# Patient Record
Sex: Female | Born: 1965 | Race: White | Hispanic: No | Marital: Married | Smoking: Current every day smoker
Health system: Southern US, Community
[De-identification: ages and names within clinical notes are randomized; demographics above are authoritative.]

## PROBLEM LIST (undated history)

## (undated) DIAGNOSIS — N390 Urinary tract infection, site not specified: Secondary | ICD-10-CM

## (undated) DIAGNOSIS — G894 Chronic pain syndrome: Secondary | ICD-10-CM

## (undated) DIAGNOSIS — J309 Allergic rhinitis, unspecified: Secondary | ICD-10-CM

## (undated) DIAGNOSIS — E049 Nontoxic goiter, unspecified: Secondary | ICD-10-CM

## (undated) DIAGNOSIS — M545 Low back pain, unspecified: Secondary | ICD-10-CM

## (undated) DIAGNOSIS — I10 Essential (primary) hypertension: Secondary | ICD-10-CM

## (undated) DIAGNOSIS — M797 Fibromyalgia: Secondary | ICD-10-CM

## (undated) DIAGNOSIS — M4800 Spinal stenosis, site unspecified: Secondary | ICD-10-CM

## (undated) HISTORY — DX: Chronic pain syndrome: G89.4

## (undated) HISTORY — DX: Urinary tract infection, site not specified: N39.0

## (undated) HISTORY — PX: LAMINECTOMY: SHX219

## (undated) HISTORY — PX: CRANIECTOMY: SHX331

## (undated) HISTORY — DX: Spinal stenosis, site unspecified: M48.00

## (undated) HISTORY — DX: Low back pain: M54.5

## (undated) HISTORY — DX: Fibromyalgia: M79.7

## (undated) HISTORY — DX: Allergic rhinitis, unspecified: J30.9

## (undated) HISTORY — DX: Nontoxic goiter, unspecified: E04.9

## (undated) HISTORY — PX: URETHRAL DILATION: SUR417

## (undated) HISTORY — DX: Low back pain, unspecified: M54.50

## (undated) HISTORY — DX: Essential (primary) hypertension: I10

---

## 1998-05-03 ENCOUNTER — Ambulatory Visit (HOSPITAL_COMMUNITY): Admission: RE | Admit: 1998-05-03 | Discharge: 1998-05-03 | Payer: Self-pay | Admitting: Obstetrics and Gynecology

## 1998-08-07 ENCOUNTER — Observation Stay (HOSPITAL_COMMUNITY): Admission: AD | Admit: 1998-08-07 | Discharge: 1998-08-07 | Payer: Self-pay | Admitting: Obstetrics and Gynecology

## 1998-08-07 ENCOUNTER — Ambulatory Visit (HOSPITAL_COMMUNITY): Admission: RE | Admit: 1998-08-07 | Discharge: 1998-08-07 | Payer: Self-pay | Admitting: Obstetrics and Gynecology

## 1999-12-29 ENCOUNTER — Ambulatory Visit (HOSPITAL_COMMUNITY): Admission: RE | Admit: 1999-12-29 | Discharge: 1999-12-29 | Payer: Self-pay | Admitting: Internal Medicine

## 2000-07-29 ENCOUNTER — Other Ambulatory Visit: Admission: RE | Admit: 2000-07-29 | Discharge: 2000-07-29 | Payer: Self-pay | Admitting: Obstetrics and Gynecology

## 2000-07-29 ENCOUNTER — Encounter (INDEPENDENT_AMBULATORY_CARE_PROVIDER_SITE_OTHER): Payer: Self-pay

## 2001-03-17 ENCOUNTER — Encounter: Payer: Self-pay | Admitting: Emergency Medicine

## 2001-03-17 ENCOUNTER — Emergency Department (HOSPITAL_COMMUNITY): Admission: EM | Admit: 2001-03-17 | Discharge: 2001-03-17 | Payer: Self-pay | Admitting: Emergency Medicine

## 2001-03-21 ENCOUNTER — Encounter: Payer: Self-pay | Admitting: Emergency Medicine

## 2001-03-21 ENCOUNTER — Emergency Department (HOSPITAL_COMMUNITY): Admission: EM | Admit: 2001-03-21 | Discharge: 2001-03-21 | Payer: Self-pay | Admitting: Emergency Medicine

## 2001-03-21 ENCOUNTER — Encounter: Admission: RE | Admit: 2001-03-21 | Discharge: 2001-03-21 | Payer: Self-pay | Admitting: Emergency Medicine

## 2001-12-03 ENCOUNTER — Emergency Department (HOSPITAL_COMMUNITY): Admission: EM | Admit: 2001-12-03 | Discharge: 2001-12-03 | Payer: Self-pay | Admitting: Emergency Medicine

## 2004-11-24 ENCOUNTER — Ambulatory Visit: Payer: Self-pay | Admitting: Internal Medicine

## 2006-02-10 ENCOUNTER — Ambulatory Visit: Payer: Self-pay | Admitting: Internal Medicine

## 2006-02-14 ENCOUNTER — Ambulatory Visit: Payer: Self-pay | Admitting: Internal Medicine

## 2006-03-28 ENCOUNTER — Ambulatory Visit: Payer: Self-pay | Admitting: Internal Medicine

## 2007-05-05 ENCOUNTER — Ambulatory Visit: Payer: Self-pay | Admitting: Internal Medicine

## 2007-07-25 ENCOUNTER — Encounter: Payer: Self-pay | Admitting: *Deleted

## 2007-07-25 DIAGNOSIS — Z9889 Other specified postprocedural states: Secondary | ICD-10-CM | POA: Insufficient documentation

## 2007-07-25 DIAGNOSIS — G894 Chronic pain syndrome: Secondary | ICD-10-CM | POA: Insufficient documentation

## 2007-07-25 DIAGNOSIS — J309 Allergic rhinitis, unspecified: Secondary | ICD-10-CM | POA: Insufficient documentation

## 2007-07-25 DIAGNOSIS — N390 Urinary tract infection, site not specified: Secondary | ICD-10-CM | POA: Insufficient documentation

## 2007-07-25 DIAGNOSIS — M545 Low back pain: Secondary | ICD-10-CM | POA: Insufficient documentation

## 2007-07-25 DIAGNOSIS — I10 Essential (primary) hypertension: Secondary | ICD-10-CM | POA: Insufficient documentation

## 2007-07-25 DIAGNOSIS — E049 Nontoxic goiter, unspecified: Secondary | ICD-10-CM | POA: Insufficient documentation

## 2007-07-25 DIAGNOSIS — IMO0001 Reserved for inherently not codable concepts without codable children: Secondary | ICD-10-CM

## 2007-07-25 DIAGNOSIS — M4802 Spinal stenosis, cervical region: Secondary | ICD-10-CM | POA: Insufficient documentation

## 2007-07-26 ENCOUNTER — Ambulatory Visit: Payer: Self-pay | Admitting: Internal Medicine

## 2007-10-27 ENCOUNTER — Ambulatory Visit: Payer: Self-pay | Admitting: Internal Medicine

## 2008-02-05 ENCOUNTER — Ambulatory Visit: Payer: Self-pay | Admitting: Internal Medicine

## 2008-04-09 HISTORY — PX: APPENDECTOMY: SHX54

## 2008-04-23 ENCOUNTER — Ambulatory Visit: Payer: Self-pay | Admitting: Internal Medicine

## 2008-04-23 LAB — CONVERTED CEMR LAB
AST: 24 units/L (ref 0–37)
Albumin: 3.5 g/dL (ref 3.5–5.2)
Alkaline Phosphatase: 124 units/L — ABNORMAL HIGH (ref 39–117)
BUN: 7 mg/dL (ref 6–23)
Calcium: 8.9 mg/dL (ref 8.4–10.5)
Chloride: 107 meq/L (ref 96–112)
Direct LDL: 90.7 mg/dL
Eosinophils Absolute: 0.1 10*3/uL (ref 0.0–0.7)
Eosinophils Relative: 0.7 % (ref 0.0–5.0)
GFR calc non Af Amer: 97 mL/min
HCT: 32.2 % — ABNORMAL LOW (ref 36.0–46.0)
Ketones, ur: NEGATIVE mg/dL
Lymphocytes Relative: 11 % — ABNORMAL LOW (ref 12.0–46.0)
Monocytes Absolute: 0.4 10*3/uL (ref 0.1–1.0)
Monocytes Relative: 2.6 % — ABNORMAL LOW (ref 3.0–12.0)
Neutro Abs: 12.5 10*3/uL — ABNORMAL HIGH (ref 1.4–7.7)
Neutrophils Relative %: 84.9 % — ABNORMAL HIGH (ref 43.0–77.0)
Nitrite: NEGATIVE
Platelets: 306 10*3/uL (ref 150–400)
RBC: 4.42 M/uL (ref 3.87–5.11)
RDW: 18.9 % — ABNORMAL HIGH (ref 11.5–14.6)
Specific Gravity, Urine: 1.015 (ref 1.000–1.03)
TSH: 1.81 microintl units/mL (ref 0.35–5.50)
Total Protein, Urine: NEGATIVE mg/dL
Total Protein: 7 g/dL (ref 6.0–8.3)
Urine Glucose: NEGATIVE mg/dL
Urobilinogen, UA: 0.2 (ref 0.0–1.0)
WBC: 14.7 10*3/uL — ABNORMAL HIGH (ref 4.5–10.5)
pH: 8 (ref 5.0–8.0)

## 2008-04-24 ENCOUNTER — Ambulatory Visit (HOSPITAL_COMMUNITY): Admission: EM | Admit: 2008-04-24 | Discharge: 2008-04-26 | Payer: Self-pay | Admitting: Emergency Medicine

## 2008-04-24 ENCOUNTER — Encounter (INDEPENDENT_AMBULATORY_CARE_PROVIDER_SITE_OTHER): Payer: Self-pay | Admitting: General Surgery

## 2008-05-13 ENCOUNTER — Ambulatory Visit: Payer: Self-pay | Admitting: Internal Medicine

## 2008-05-13 ENCOUNTER — Encounter: Payer: Self-pay | Admitting: Internal Medicine

## 2008-05-13 ENCOUNTER — Other Ambulatory Visit: Admission: RE | Admit: 2008-05-13 | Discharge: 2008-05-13 | Payer: Self-pay | Admitting: Internal Medicine

## 2008-05-13 DIAGNOSIS — K3580 Unspecified acute appendicitis: Secondary | ICD-10-CM | POA: Insufficient documentation

## 2008-05-14 LAB — HM PAP SMEAR

## 2008-05-20 ENCOUNTER — Encounter: Payer: Self-pay | Admitting: Internal Medicine

## 2008-08-15 ENCOUNTER — Ambulatory Visit: Payer: Self-pay | Admitting: Internal Medicine

## 2008-10-18 ENCOUNTER — Telehealth (INDEPENDENT_AMBULATORY_CARE_PROVIDER_SITE_OTHER): Payer: Self-pay | Admitting: *Deleted

## 2008-10-29 ENCOUNTER — Ambulatory Visit: Payer: Self-pay | Admitting: Internal Medicine

## 2009-03-17 ENCOUNTER — Ambulatory Visit: Payer: Self-pay | Admitting: Internal Medicine

## 2009-07-02 ENCOUNTER — Ambulatory Visit: Payer: Self-pay | Admitting: Internal Medicine

## 2009-10-16 ENCOUNTER — Telehealth: Payer: Self-pay | Admitting: Internal Medicine

## 2009-10-24 ENCOUNTER — Ambulatory Visit: Payer: Self-pay | Admitting: Internal Medicine

## 2009-10-24 LAB — CONVERTED CEMR LAB
BUN: 6 mg/dL (ref 6–23)
Calcium: 9 mg/dL (ref 8.4–10.5)
Chloride: 107 meq/L (ref 96–112)
Creatinine, Ser: 0.7 mg/dL (ref 0.4–1.2)
GFR calc non Af Amer: 96.21 mL/min (ref >60)
Potassium: 4 meq/L (ref 3.5–5.1)

## 2009-10-26 DIAGNOSIS — F341 Dysthymic disorder: Secondary | ICD-10-CM

## 2009-11-03 ENCOUNTER — Emergency Department (HOSPITAL_COMMUNITY): Admission: EM | Admit: 2009-11-03 | Discharge: 2009-11-03 | Payer: Self-pay | Admitting: Emergency Medicine

## 2009-12-22 ENCOUNTER — Telehealth: Payer: Self-pay | Admitting: Internal Medicine

## 2010-01-15 ENCOUNTER — Ambulatory Visit: Payer: Self-pay | Admitting: Internal Medicine

## 2010-01-15 DIAGNOSIS — I872 Venous insufficiency (chronic) (peripheral): Secondary | ICD-10-CM | POA: Insufficient documentation

## 2010-04-23 ENCOUNTER — Ambulatory Visit: Payer: Self-pay | Admitting: Internal Medicine

## 2010-07-21 ENCOUNTER — Ambulatory Visit: Payer: Self-pay | Admitting: Internal Medicine

## 2010-08-27 ENCOUNTER — Telehealth: Payer: Self-pay | Admitting: Internal Medicine

## 2010-09-08 NOTE — Assessment & Plan Note (Signed)
Summary: FU/ REFILLS /NWS   Vital Signs:  Patient profile:   45 year old female Height:      61 inches Weight:      161 pounds BMI:     30.53 O2 Sat:      98 % on Room air Temp:     97.7 degrees F oral Pulse rate:   104 / minute BP sitting:   198 / 94  (left arm) Cuff size:   regular  Vitals Entered By: Charlynne Cousins CMA (April 23, 2010 11:08 AM)  O2 Flow:  Room air CC: pt here for follow up and refill on methadone. she would also like to discuss temazepam medication/ ab   Primary Care Provider:  norins  CC:  pt here for follow up and refill on methadone. she would also like to discuss temazepam medication/ ab.  History of Present Illness: Patient presents for routine medication mangement. She has been medically stable. She is under great stress: she in unemployed and may loose her house; her son is causing problems for her - comes to increased stress.   She has a good bowel habit. No methadone related somnolence or side-effects. Generally well controlled in regard to pain.   Reviewed record: had full labs Sept '10 - LDL 90, HDL 24, thyroid function normal. Had BMP April '11 - normal BUN/Cr and K.   Current Medications (verified): 1)  Sertraline Hcl 100 Mg Tabs (Sertraline Hcl) .Marland Kitchen.. 1 By Mouth Once Daily 2)  Methadone Hcl 10 Mg  Tabs (Methadone Hcl) .... Take A Total of 67m Three Times A Day To Be Filled On or After 03/26/2010 3)  Hydrochlorothiazide 25 Mg  Tabs (Hydrochlorothiazide) ..Marland Kitchen. 1 By Mouth Once Daily 4)  Lisinopril 10 Mg Tabs (Lisinopril) ..Marland Kitchen. 1 By Mouth Once Daily For Bp 5)  Temazepam 15 Mg Caps (Temazepam) ..Marland Kitchen. 1 By Mouth At Bedtime Prn  Allergies (verified): No Known Drug Allergies  Past History:  Past Medical History: Last updated: 07/25/2007 FIBROMYALGIA (ICD-729.1) GOITER, UNSPECIFIED (ICD-240.9) Hx of URINARY TRACT INFECTION, CHRONIC (ICD-599.0) * HYPOPLASTIC POSTERIOR FOSSA SYNDROME SPINAL STENOSIS (ICD-724.00) ALLERGIC RHINITIS  (ICD-477.9) HYPERTENSION (ICD-401.9) LOW BACK PAIN, CHRONIC (ICD-724.2) CHRONIC PAIN SYNDROME (ICD-338.4)    Past Surgical History: Last updated: 05/13/2008 CAESAREAN SECTION, HX OF X2 (ICD-V39.01) * Hx of URETHRAL DILATATION AT AGE 74 * Hx of CRANIECTOMY LAMINECTOMY, HX OF (ICD-V45.89) Appendectomy-laproscopic September '09 (Dr. 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    Physical Exam  General:  alert, well-developed, well-nourished, and well-hydrated.   Head:  normocephalic and atraumatic.   Eyes:  pupils equal, pupils round, corneas and lenses clear, no optic disk abnormalities, and no retinal abnormalitiies.   Ears:  R ear normal and L ear normal.   Mouth:  good dentition, no gingival abnormalities, and pharynx pink and moist.   Neck:  supple, no masses, no thyromegaly, and no carotid bruits.   Chest Wall:  no deformities and no tenderness.   Breasts:  No mass, nodules, thickening, tenderness, bulging, retraction, inflamation, nipple discharge or skin changes noted.   Lungs:  normal respiratory effort and normal breath  sounds.   Heart:  normal rate, regular rhythm, no murmur, and no JVD.   Abdomen:  soft, non-tender, normal bowel sounds, no guarding, no rigidity, and no hepatomegaly.   Msk:  normal ROM, no joint swelling, no joint warmth, and no joint deformities.   Pulses:  2+ radial Extremities:  No clubbing, cyanosis, edema, or deformity noted with normal full range of motion of all joints.   Neurologic:  alert & oriented  X3, cranial nerves II-XII intact, gait normal, and DTRs symmetrical and normal.   Skin:  turgor normal, color normal, no rashes, and no petechiae.   Cervical Nodes:  no anterior cervical adenopathy and no posterior cervical adenopathy.   Axillary Nodes:  no R axillary adenopathy and no L axillary adenopathy.   Psych:  Oriented X3, normally interactive, and good eye contact.     Impression & Recommendations:  Problem # 1:  DEPRESSION/ANXIETY (ICD-300.4) stable but stressed due to unemployment and trouble with her son.   No change in medications - continue sertraline  Problem # 2:  FIBROMYALGIA (ICD-729.1) No change. Tolerating medication. No adverse side effects noted.   Plan - 3 month renewal  Her updated medication list for this problem includes:    Methadone Hcl 10 Mg Tabs (Methadone hcl) .Marland Kitchen... Take a total of 1m three times a day to be filled on or after 06/26/2010  Problem # 3:  HYPERTENSION (ICD-401.9)  Her updated medication list for this problem includes:    Hydrochlorothiazide 25 Mg Tabs (Hydrochlorothiazide) ..Marland Kitchen.. 1 by mouth once daily    Lisinopril 10 Mg Tabs (Lisinopril) ..Marland Kitchen.. 1 by mouth once daily for bp  BP today: 198/94 Prior BP: 160/98 (01/15/2010)  Labs Reviewed: K+: 4.0 (10/24/2009) Creat: : 0.7 (10/24/2009)   Generally better control. she is asymptomatic.  Plan - continue present medications.   Problem # 4:  Preventive Health Care (ICD-V70.0) unremarkable exam. she has had recent labs and is current. she had a normal breast exam today. She is current with gyn exams.  Complete Medication List: 1)  Sertraline Hcl 100 Mg Tabs (Sertraline hcl) ..Marland Kitchen. 1 by mouth once daily 2)  Methadone Hcl 10 Mg Tabs (Methadone hcl) .... Take a total of 566mthree times a day to be filled on or after 06/26/2010 3)  Hydrochlorothiazide 25 Mg Tabs (Hydrochlorothiazide) ...Marland Kitchen 1 by mouth once daily 4)  Lisinopril 10 Mg Tabs (Lisinopril) ...Marland Kitchen 1 by mouth once daily for bp 5)   Triazolam 0.125 Mg Tabs (Triazolam) ...Marland Kitchen 1 by mouth at bedtime for insomnia Prescriptions: METHADONE HCL 10 MG  TABS (METHADONE HCL) Take a total of 5025mhree times a day To be filled on or after 06/26/2010  #450 x 0   Entered by:   Ami Bullins CMA   Authorized by:   MicNeena Rhymes   Signed by:   AmiCharlynne CousinsA on 04/23/2010   Method used:   Print then Give to Patient   RxID:   [id number] HCL 10 MG  TABS (METHADONE HCL) Take a total of 6m49mree times a day To be filled on or after 05/26/2010  #450 x 0   Entered by:   Ami Bullins CMA   Authorized by:   MichNeena Rhymes  Signed by:   Ami Charlynne Cousins on 04/23/2010   Method used:   Print then Give to Patient   RxID:   [id number] HCL 10 MG  TABS (METHADONE HCL) Take a total of 6mg6m  three times a day To be filled on or after 04/26/2010  #450 x 0   Entered by:   Ami Bullins CMA   Authorized by:   Neena Rhymes MD   Signed by:   Charlynne Cousins CMA on 04/23/2010   Method used:   Print then Give to Patient   RxID:   [id number] TRIAZOLAM 0.125 MG TABS (TRIAZOLAM) 1 by mouth at bedtime for insomnia  #30 x 5   Entered and Authorized by:   Neena Rhymes MD   Signed by:   Neena Rhymes MD on 04/23/2010   Method used:   Print then Give to Patient   RxID:   [id number]

## 2010-09-08 NOTE — Progress Notes (Signed)
Summary: REFILL  Phone Note Refill Request   Refills Requested: Medication #1:  TEMAZEPAM 15 MG CAPS 1 by mouth at bedtime prn. Initial call taken by: Charlsie Quest, [place],  Dec 22, 2009 5:49 PM  Follow-up for Phone Call        ok for refill  x 5 Follow-up by: Neena Rhymes MD,  Dec 23, 2009 10:46 AM    Prescriptions: TEMAZEPAM 15 MG CAPS (TEMAZEPAM) 1 by mouth at bedtime prn  #30 x 5   Entered by:   Ami Bullins CMA   Authorized by:   Neena Rhymes MD   Signed by:   Charlynne Cousins CMA on 12/23/2009   Method used:   Telephoned to ...       [facility] (retail)       [address] [place], [place]  [id number]       Ph: [phone]       Fax: [phone]   RxID:   [id number]

## 2010-09-08 NOTE — Assessment & Plan Note (Signed)
Summary: MED REFILL PER AMI/NWS  MAY COME AT 11:15   Vital Signs:  Patient profile:   45 year old female Height:      61 inches Weight:      164 pounds BMI:     31.10 O2 Sat:      98 % on Room air Temp:     97.6 degrees F oral Pulse rate:   70 / minute BP sitting:   138 / 86  (left arm) Cuff size:   large  Vitals Entered By: Charlynne Cousins CMA (October 24, 2009 11:33 AM)  O2 Flow:  Room air CC: pt here for follow up office visit. Pt needs refill on methadone and would like to discuss dose on temazepam and sertraline/ ab   Primary Care Provider:  norins  CC:  pt here for follow up office visit. Pt needs refill on methadone and would like to discuss dose on temazepam and sertraline/ ab.  History of Present Illness: Patient presents for follow-up of BP which is doing better on ACE-I but she did not come back for lab. she is tolerating BP med well.  She has done well with sertraline but feels it could help more.   She is stable on her pain medications.   Current Medications (verified): 1)  Sertraline Hcl 50 Mg Tabs (Sertraline Hcl) .Marland Kitchen.. 1 By Mouth Once Daily For Depression and To Augment Pain Mgt. 2)  Methadone Hcl 10 Mg  Tabs (Methadone Hcl) .... Take A Total of 72m Three Times A Day To Be Filled On or After 08/31/2009 3)  Hydrochlorothiazide 25 Mg  Tabs (Hydrochlorothiazide) ..Marland Kitchen. 1 By Mouth Once Daily 4)  Lisinopril 10 Mg Tabs (Lisinopril) ..Marland Kitchen. 1 By Mouth Once Daily For Bp 5)  Temazepam 15 Mg Caps (Temazepam) ..Marland Kitchen. 1 By Mouth At Bedtime Prn  Allergies (verified): No Known Drug Allergies  Past History:  Past Medical History: Last updated: 07/25/2007 FIBROMYALGIA (ICD-729.1) GOITER, UNSPECIFIED (ICD-240.9) Hx of URINARY TRACT INFECTION, CHRONIC (ICD-599.0) * HYPOPLASTIC POSTERIOR FOSSA SYNDROME SPINAL STENOSIS (ICD-724.00) ALLERGIC RHINITIS (ICD-477.9) HYPERTENSION (ICD-401.9) LOW BACK PAIN, CHRONIC (ICD-724.2) CHRONIC PAIN SYNDROME (ICD-338.4)    Past Surgical  History: Last updated: 05/13/2008 CAESAREAN SECTION, HX OF X2 (ICD-V39.01) * Hx of URETHRAL DILATATION AT AGE 30 * Hx of CRANIECTOMY LAMINECTOMY, HX OF (ICD-V45.89) Append[facility] September '09 [facility] Incre[facility] for [facility], FH reviewed for relevance  Review of Systems  The patient denies anorexia, weight loss, weight gain, chest pain, dyspnea on exertion, prolonged cough, abdominal pain, difficulty walking, depression, and enlarged lymph nodes.    Physical Exam  General:  overweight white female no distress Head:  Normocephalic and atraumatic without obvious abnormalities. No apparent alopecia or balding. Lungs:  normal respiratory effort and normal breath sounds.   Heart:  normal rate, regular rhythm, and no murmur.     Impression & Recommendations:  Problem # 1:  FIBROMYALGIA (ICD-729.1) Stable. No  GI problems, somnolence or other side effects of medication.  Plan - Rx refill 30days with 3 scripts  Her updated medication list for this problem includes:    Methadone Hcl 10 Mg Tabs (Methadone hcl) ..Marland Kitchen.. Take a total of 590mthree times a day to be filled on or after 12/24/2009  Problem # 2:  HYPERTENSION (ICD-401.9)  Her updated medication list for this problem includes:    Hydrochlorothiazide 25 Mg Tabs (Hydrochlorothiazide) ...Marland Kitchen. 1 by mouth once daily    Lisinopril 10 Mg Tabs (Lisinopril) ...Marland Kitchen. 1 by mouth once daily for  bp  Orders: TLB-BMP (Basic Metabolic Panel-BMET) ([id number])  BP today: 138/86 Prior BP: 150/100 (07/02/2009)  Adequate control. Bmet normal  Problem # 3:  DEPRESSION/ANXIETY (ICD-300.4)  Had a good response to sertraline but still with symptoms.  Plan - increase sertraline to 140m once daily.   Orders: Prescription Created Electronically [phone]  Complete Medication List: 1)  Sertraline Hcl 100 Mg Tabs (Sertraline hcl) ..Marland Kitchen. 1 by mouth once daily 2)  Methadone Hcl 10 Mg Tabs (Methadone hcl) .... Take a total of 546m three times a day to be filled on or after 12/24/2009 3)  Hydrochlorothiazide 25 Mg Tabs (Hydrochlorothiazide) ...Marland Kitchen 1 by mouth once daily 4)  Lisinopril 10 Mg Tabs (Lisinopril) ...Marland Kitchen 1 by mouth once daily for bp 5)  Temazepam 15 Mg Caps (Temazepam) ...Marland Kitchen 1 by mouth at bedtime prn  Patient: Emily [name] Note: All result statuses are Final unless otherwise noted.  Tests: (1) BMP (METABOL)   Sodium                    141 mEq/L                   135-145   Potassium                 4.0 mEq/L                   3.5-5.1   Chloride                  107 mEq/L                   96-112   Carbon Dioxide            29 mEq/L                    19-32   Glucose                   83 mg/dL                    70-99   BUN                       6 mg/dL                     6-23   Creatinine                0.7 mg/dL                   0.4-1.2   Calcium                   9.0 mg/dL                   8.4-10.5   GFR                       96.21 mL/min                >60Prescriptions: METHADONE HCL 10 MG  TABS (METHADONE HCL) Take a total of 5043mhree times a day To be filled on or after 12/24/2009  #450 x 0   Entered by:   Ami Bullins CMA   Authorized by:   MicNeena Rhymes   Signed by:   AmiCharlynne CousinsA on 10/24/2009   Method used:  Print then Give to Patient   RxID:   [id number] METHADONE HCL 10 MG  TABS (METHADONE HCL) Take a total of 30m three times a day To be filled on or after 11/24/2009  #450 x 0   Entered by:   Ami Bullins CMA   Authorized by:   MNeena RhymesMD   Signed by:   ACharlynne CousinsCMA on 10/24/2009   Method used:   Print then Give to Patient   RxID:   [id number] HCL 10 MG  TABS (METHADONE HCL) Take a total of 523mthree times a day To be filled on or after 10/24/2009  #450 x 0   Entered by:   Ami Bullins CMA   Authorized by:   MiNeena RhymesD   Signed by:   AmCharlynne CousinsMA on 10/24/2009   Method used:   Print then Give to Patient   RxID:    [id number] HCL 100 MG TABS (SERTRALINE HCL) 1 by mouth once daily  #30 x 12   Entered and Authorized by:   MiNeena RhymesD   Signed by:   MiNeena RhymesD on 10/24/2009   Method used:   Electronically to        [facility]       [id number]     Ph: [phone]     Fax: [phone] RxID:   [id number]

## 2010-09-08 NOTE — Progress Notes (Signed)
Summary: Methadone  Phone Note Call from Patient Call back at Home Phone [phone]   Summary of Call: Patient left message on triage that she has an appt on 3/24 but will run out of meds before that appt. Patient states that she only has 9 days left of her meds (Mathadone). Initial call taken by: Ernestene Mention,  October 16, 2009 9:41 AM  Follow-up for Phone Call        pt's appt moved to march 18, so we can fill her methadone at her appt as we always do Follow-up by: Ami Bullins CMA,  October 16, 2009 11:39 AM

## 2010-09-08 NOTE — Assessment & Plan Note (Signed)
Summary: SWOLLEN FEET/NWS   Vital Signs:  Patient profile:   45 year old Emily [name] Weight:      161 pounds Temp:     98.2 degrees F Pulse rate:   96 / minute BP sitting:   160 / 98  (left arm) Cuff size:   regular  Vitals Entered By: Charlsie Quest, CMA (January 15, 2010 2:36 PM) CC: Needs refills, c/o bilateral foot swelling, better w/rest   Primary Care Provider:  Dean Goldner  CC:  Needs refills, c/o bilateral foot swelling, and better w/rest.  History of Present Illness: Patient presents for evaluation of pedal and LE edema. This does progress through the day and is better in the AM. She reports improvement if she will elevate her legs during the day. No history of renal or heart failure.   Allergies: No Known Drug Allergies  Past History:  Past Medical History: Last updated: 07/25/2007 FIBROMYALGIA (ICD-729.1) GOITER, UNSPECIFIED (ICD-240.9) Hx of URINARY TRACT INFECTION, CHRONIC (ICD-599.0) * HYPOPLASTIC POSTERIOR FOSSA SYNDROME SPINAL STENOSIS (ICD-724.00) ALLERGIC RHINITIS (ICD-477.9) HYPERTENSION (ICD-401.9) LOW BACK PAIN, CHRONIC (ICD-724.2) CHRONIC PAIN SYNDROME (ICD-338.4)    Past Surgical History: Last updated: 05/13/2008 CAESAREAN SECTION, HX OF X2 (ICD-V39.01) * Hx of URETHRAL DILATATION AT AGE 26 * Hx of CRANIECTOMY LAMINECTOMY, HX OF (ICD-V45.89) Appendectomy-laproscopic September '09 (Dr. 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    Physical Exam  General:  Well-developed,well-nourished,in no acute distress; alert,appropriate and cooperative throughout examination Eyes:  corneas and lenses clear and no injection.   Neck:  supple and full ROM.   Lungs:  normal respiratory effort.   Heart:  normal rate and regular rhythm.   Msk:  normal ROM, no joint tenderness, no joint swelling, no joint warmth, no joint deformities, and no joint instability.   Pulses:  2+ radial Extremities:  2+ pedal edema left, 1+ pedal edema right Neurologic:  alert & oriented X3, cranial nerves II-XII intact, and gait normal.   Skin:  chronic excoriations on her legs. No infected lesions Psych:  Oriented X3, memory intact for recent and remote, and normally interactive.     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  Plan - elevate legs          support hose.   Complete Medication List: 1)  Sertraline Hcl 100 Mg Tabs (Sertraline hcl) .Marland Kitchen.. 1 by mouth once daily 2)  Methadone Hcl 10 Mg Tabs (Methadone hcl) .... Take a total of 70m three times a day to be filled on or after 03/26/2010 3)  Hydrochlorothiazide 25 Mg Tabs (Hydrochlorothiazide) ..Marland Kitchen. 1 by mouth once daily 4)  Lisinopril 10 Mg Tabs (Lisinopril) ..Marland Kitchen. 1 by mouth once daily for bp 5)  Temazepam 15 Mg Caps (Temazepam) ..Marland Kitchen. 1 by mouth at bedtime prn Prescriptions: SERTRALINE HCL 100 MG TABS (SERTRALINE HCL) 1 by mouth once daily  #30 x 12   Entered by:   SCharlsie Quest CMA  Authorized by:   Neena Rhymes MD   Signed by:   Charlsie Quest, CMA on 01/15/2010   Method used:   Electronically to        [facility] (retail)       [address], [place]  [id number]       Ph: [phone]       Fax: [phone]   RxID:   [id number] LISINOPRIL 10 MG TABS (LISINOPRIL) 1 by  mouth once daily for BP  #30 x 12   Entered by:   Charlsie Quest, CMA   Authorized by:   Neena Rhymes MD   Signed by:   Charlsie Quest, CMA on 01/15/2010   Method used:   Electronically to        [facility] (retail)       [address] [place], [place]  [address]       Ph: [phone]       Fax: [phone]   RxID:   [id number] HYDROCHLOROTHIAZIDE 25 MG  TABS (HYDROCHLOROTHIAZIDE) 1 by mouth once daily  #30 x 12   Entered by:   Charlsie Quest, CMA   Authorized by:   Neena Rhymes MD   Signed by:   Charlsie Quest, CMA on 01/15/2010   Method used:   Electronically to        [facility] (retail)       [address] [place], [place]  [id number]       Ph: [phone]       Fax: [phone]   RxID:   [id number] METHADONE HCL 10 MG  TABS (METHADONE HCL) Take a total of 24m three times a day To be filled on or after 03/26/2010  #450 x 0   Entered by:   SCharlsie Quest CMA   Authorized by:   MNeena RhymesMD   Signed by:   SCharlsie Quest CMA on 01/15/2010   Method used:   Print then Give to Patient   RxID:   [id number] HCL 10 MG  TABS (METHADONE HCL) Take a total of 532mthree times a day To be filled on or after 02/23/2010  #450 x 0   Entered by:   SaCharlsie QuestCMA   Authorized by:   MiNeena RhymesD   Signed by:   SaCharlsie QuestCMA on 01/15/2010   Method used:   Print then Give to Patient   RxID:   [id number] HCL 10 MG  TABS (METHADONE HCL) Take a total of 5034mhree times a day To be filled on or after 01/24/2010  #450 x 0   Entered by:   SarCharlsie QuestMA   Authorized by:   MicNeena Rhymes   Signed by:   SarCharlsie QuestMA on 01/15/2010   Method used:   Print then Give to Patient   RxID:   [id number] Immunization History:  Tetanus/Td Immunization History:    Tetanus/Td:  historical (10/07/2009)

## 2010-09-10 NOTE — Assessment & Plan Note (Signed)
Summary: 3 MOS F/U #/CD   Vital Signs:  Patient profile:   45 year old female Height:      61 inches Weight:      160 pounds BMI:     30.34 O2 Sat:      99 % on Room air Temp:     97.4 degrees F oral Pulse rate:   89 / minute BP sitting:   180 / 110  (left arm) Cuff size:   regular  Vitals Entered By: Charlynne Cousins CMA (July 24, 2010 10:43 AM)  O2 Flow:  Room air CC: 3 month follow up/ ab   Primary Care Provider:  norins  CC:  3 month follow up/ ab.  History of Present Illness: Patient presents for medication refill. she is having a hard time: she is unemployed, her house was foreclosed and she has moved in with her father and step-mother while her sons are living with their father. She reports that she is having increased anxiety and a problem with loose stools.   She has had no problems with her methadone. No somnolence or need to change dosing.   BP today is very high - she reports that it has been running high for several weeks: 150-160 over 100. She has had no symptoms: no severe headache, nosebleeds, double vision. She does have moderate HA that is relieved with ibuprofen suggestive of tnesion headache.   Current Medications (verified): 1)  Sertraline Hcl 100 Mg Tabs (Sertraline Hcl) .Marland Kitchen.. 1 By Mouth Once Daily 2)  Methadone Hcl 10 Mg  Tabs (Methadone Hcl) .... Take A Total of 67m Three Times A Day To Be Filled On or After 06/26/2010 3)  Hydrochlorothiazide 25 Mg  Tabs (Hydrochlorothiazide) ..Marland Kitchen. 1 By Mouth Once Daily 4)  Lisinopril 10 Mg Tabs (Lisinopril) ..Marland Kitchen. 1 By Mouth Once Daily For Bp 5)  Triazolam 0.125 Mg Tabs (Triazolam) ..Marland Kitchen. 1 By Mouth At Bedtime For Insomnia 6)  Aleve 220 Mg Tabs (Naproxen Sodium) ..Marland Kitchen. 1 Tablet Two Times A Day For Body Aches 7)  Imodium A-D 2 Mg Tabs (Loperamide Hcl) .... As Needed 8)  Ibuprofen 200 Mg Tabs (Ibuprofen) .... 2 Tablets As Needed For Headaches  Allergies (verified): No Known Drug Allergies  Past History:  Past Medical  History: Last updated: 07/25/2007 FIBROMYALGIA (ICD-729.1) GOITER, UNSPECIFIED (ICD-240.9) Hx of URINARY TRACT INFECTION, CHRONIC (ICD-599.0) * HYPOPLASTIC POSTERIOR FOSSA SYNDROME SPINAL STENOSIS (ICD-724.00) ALLERGIC RHINITIS (ICD-477.9) HYPERTENSION (ICD-401.9) LOW BACK PAIN, CHRONIC (ICD-724.2) CHRONIC PAIN SYNDROME (ICD-338.4)    Past Surgical History: Last updated: 05/13/2008 CAESAREAN SECTION, HX OF X2 (ICD-V39.01) * Hx of URETHRAL DILATATION AT AGE 69 * Hx of CRANIECTOMY LAMINECTOMY, HX OF (ICD-V45.89) Appendectomy-laproscopic September '09 (Dr. 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    Physical Exam  General:  overeweight white woman in no distress Head:  normocephalic.   Eyes:  C&S clear, PERRLA, fundi without hemorrhages, normal disk Neck:  supple.   Lungs:  normal respiratory effort, normal breath sounds, no crackles, and no wheezes.   Heart:  no gallop.  Abdomen:  soft, non-tender, no guarding, and no rigidity.   Msk:  normal ROM, no joint tenderness, no joint deformities, and no joint instability.   Pulses:  2+ radial pulses Extremities:  trace left pedal edema.   Neurologic:  alert & oriented X3, cranial nerves II-XII intact, gait normal, and DTRs symmetrical and normal.   Skin:  turgor normal, color normal, no rashes, and no ulcerations.   Cervical Nodes:  no anterior cervical adenopathy and no posterior  cervical adenopathy.   Psych:  Oriented X3, memory intact for recent and remote, and normally interactive.     Impression & Recommendations:  Problem # 1:  DEPRESSION/ANXIETY (ICD-300.4) patient with suboptimally controlled depression and anxiety.   Plan - increase sertraline to 141m AM and 543mPM           report back as to effect of dose change  Problem # 2:  CHRONIC PAIN SYNDROME (ICD-338.4) Stable. Renewed methadone Rx x 3 months  Problem # 3:  HYPERTENSION (ICD-401.9) Very poor control. No symptoms or abnormal findings on physical exam.  Plan - Increase lisinopril to 2093mnce daily and continue HCTZ at 73m44m        monitory BP at home and report back with additional changes as needed.  Her updated medication list for this problem includes:    Hydrochlorothiazide 25 Mg Tabs (Hydrochlorothiazide) .....Marland Kitchen1 by mouth once daily    Lisinopril 20 Mg Tabs (Lisinopril) .....Marland Kitchen1 by mouth once daily  Problem # 4:  CHANGE IN BOWELS (ICD-787.99) may be anxiety driven vis IBS.  Plan - bulk laxative  Complete Medication List: 1)  Sertraline Hcl 100 Mg Tabs (Sertraline hcl) .... Marland Kitchen by mouth am, 1/2 by mouth pm 2)  Methadone Hcl 10 Mg Tabs (Methadone hcl) .... Take a total of 50mg108mee times a day to be filled on or after 09/26/2010 3)  Hydrochlorothiazide 25 Mg Tabs (Hydrochlorothiazide) .... 1Marland Kitchenby mouth once daily 4)  Lisinopril 20 Mg Tabs (Lisinopril) .... 1Marland Kitchenby mouth once daily 5)  Triazolam 0.125 Mg Tabs (Triazolam) .... 1Marland Kitchenby mouth at bedtime for insomnia 6)  Aleve 220 Mg Tabs (Naproxen sodium) .... 1Marland Kitchentablet two times a day for body aches 7)  Imodium A-d 2 Mg Tabs (Loperamide hcl) .... As needed 8)  Ibuprofen 200 Mg Tabs (Ibuprofen) .... 2 tablets as needed for headaches  Patient Instructions: 1)  chronic pain mgt - will refill Rx x 3 months 2)  Change in bowel habit - may  be anxiety related. Plan - use of a bulk laxative, e. g. metamucil, fibercon, etc to help "bulk up the  stool." 3)  Anxiety - may increase sertraline to 1 tab in am and 1/2 tab in the pm. Let me know if this helps and i can send in a new prescription. 4)  Blood pressure - too high!! Plan - double the lisinopril to 20mg 13my (2 x 10mg) 53mk your blood pressure and report  back to me. We will adjust medications to control. Prescriptions: METHADONE HCL 10 MG  TABS (METHADONE HCL) Take a total of 50mg th67mtimes a day To be filled on or after 09/26/2010  #450 x 0   Entered by:   Sarah DoCharlsie QuestAuthorized by:   Michael Neena Rhymesgned by:   Sarah DoCharlsie Quest 07/24/2010   Method used:   Print then Give to Patient   RxID:   [id number]  HCL 10 MG  TABS (METHADONE HCL) Take a total of 63m three times a day To be filled on or after 08/26/2010  #450 x 0   Entered by:   SCharlsie Quest CMA   Authorized by:   MNeena RhymesMD   Signed by:   SCharlsie Quest CMA on 07/24/2010   Method used:   Print then Give to Patient   RxID:   [id number] HCL 10 MG  TABS (METHADONE HCL) Take a total of 52mthree times a day To be filled on or after 07/26/2010  #450 x 0   Entered by:   SaCharlsie QuestCMA   Authorized by:   MiNeena RhymesD   Signed by:   SaCharlsie QuestCMA on 07/24/2010   Method used:   Print then Give to Patient   RxID:   [id number]  Orders Added: 1)  Est. Patient Level IV [[id number][81683]

## 2010-09-10 NOTE — Progress Notes (Signed)
  Phone Note Call from Patient   Summary of Call: Pt called. Methadone has been in short supply per pharmacy.  I called [facility]- They can fill # [id number] [facility] has remainder, prepared new rx for remainder qty of 243 Initial call taken by: Charlsie Quest, CMA,  August 27, 2010 12:01 PM    New/Updated Medications: METHADONE HCL 10 MG  TABS (METHADONE HCL) Take a total of 32m three times a day FILL TODAY METHADONE HCL 10 MG  TABS (METHADONE HCL) Take a total of 544mthree times a day To be filled on or after 09/26/2010 Prescriptions: METHADONE HCL 10 MG  TABS (METHADONE HCL) Take a total of 5032mhree times a day FILL TODAY  #243 x 0   Entered by:   SarCharlsie QuestMA   Authorized by:   MicNeena Rhymes   Signed by:   SarCharlsie QuestMA on 08/27/2010   Method used:   Print then Give to Patient   RxID:   [id number]

## 2010-10-22 ENCOUNTER — Ambulatory Visit (INDEPENDENT_AMBULATORY_CARE_PROVIDER_SITE_OTHER): Payer: Medicaid Other | Admitting: Internal Medicine

## 2010-10-22 ENCOUNTER — Encounter: Payer: Self-pay | Admitting: Internal Medicine

## 2010-10-22 DIAGNOSIS — F341 Dysthymic disorder: Secondary | ICD-10-CM

## 2010-10-22 DIAGNOSIS — IMO0001 Reserved for inherently not codable concepts without codable children: Secondary | ICD-10-CM

## 2010-10-27 NOTE — Assessment & Plan Note (Signed)
Summary: 3 MTH FU--PER  PT---STC   Vital Signs:  Patient profile:   46 year old female Height:      61 inches Weight:      162 pounds BMI:     30.72 O2 Sat:      99 % on Room air Temp:     98.2 degrees F oral Pulse rate:   98 / minute BP sitting:   180 / 110  (left arm) Cuff size:   regular  Vitals Entered By: Charlynne Cousins CMA (October 22, 2010 11:14 AM)  O2 Flow:  Room air CC: follow-up visit, refill on meds/ ab   Primary Care Provider:  Elsie Baynes  CC:  follow-up visit and refill on meds/ ab.  History of Present Illness: Patient presents for follow-up of anxiety. She is having regular panic attacks with substernal chest pressure and difficulty with driving. She reports her 22 y/o son who had  been living with his father has run away. She does see some improvement on increased dose of setraline. She is not on any anxiolytics. She is still living with her father. she is actively job Field seismologist.   She continues on methadone: no somnolence or constipation   Current Medications (verified): 1)  Sertraline Hcl 100 Mg Tabs (Sertraline Hcl) .Marland Kitchen.. 1 By Mouth Am, 1/2 By Mouth Pm 2)  Methadone Hcl 10 Mg  Tabs (Methadone Hcl) .... Take A Total of 75m Three Times A Day To Be Filled On or After 09/26/2010 3)  Hydrochlorothiazide 25 Mg  Tabs (Hydrochlorothiazide) ..Marland Kitchen. 1 By Mouth Once Daily 4)  Lisinopril 20 Mg Tabs (Lisinopril) ..Marland Kitchen. 1 By Mouth Once Daily 5)  Triazolam 0.125 Mg Tabs (Triazolam) ..Marland Kitchen. 1 By Mouth At Bedtime For Insomnia 6)  Aleve 220 Mg Tabs (Naproxen Sodium) ..Marland Kitchen. 1 Tablet Two Times A Day For Body Aches 7)  Imodium A-D 2 Mg Tabs (Loperamide Hcl) .... As Needed 8)  Ibuprofen 200 Mg Tabs (Ibuprofen) .... 2 Tablets As Needed For Headaches  Allergies (verified): No Known Drug Allergies  Past History:  Past Medical History: Last updated: 07/25/2007 FIBROMYALGIA (ICD-729.1) GOITER, UNSPECIFIED (ICD-240.9) Hx of URINARY TRACT INFECTION, CHRONIC (ICD-599.0) * HYPOPLASTIC POSTERIOR  FOSSA SYNDROME SPINAL STENOSIS (ICD-724.00) ALLERGIC RHINITIS (ICD-477.9) HYPERTENSION (ICD-401.9) LOW BACK PAIN, CHRONIC (ICD-724.2) CHRONIC PAIN SYNDROME (ICD-338.4)    Past Surgical History: Last updated: 05/13/2008 CAESAREAN SECTION, HX OF X2 (ICD-V39.01) * Hx of URETHRAL DILATATION AT AGE 79 * Hx of CRANIECTOMY LAMINECTOMY, HX OF (ICD-V45.89) Appendectomy-laproscopic September '09 (Dr. 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    Physical Exam  General:  alert, well-developed, well-nourished, and well-hydrated.   Head:  normocephalic, atraumatic, and no abnormalities observed.   Eyes:  vision grossly intact.   Lungs:  normal respiratory effort and normal breath sounds.   Heart:  normal rate, regular rhythm, and no murmur.   Abdomen:  soft, non-tender, normal bowel sounds, no guarding, and no rigidity.   Msk:  normal ROM, no joint tenderness, and no joint warmth.   Pulses:  2+ radial Neurologic:  alert & oriented X3, cranial nerves II-XII intact, and gait normal.   Skin:  white spots c/w vitaligo Psych:  Oriented X3, good eye contact, and moderately anxious.     Impression & Recommendations:  Problem # 1:  DEPRESSION/ANXIETY (ICD-300.4) patient with continue stressors and persistent anxiety with what sounds like panic attacks.  Plan - continue sertraline at 136m AM and 548mPM           Add alprazolam 0.5 q6 as needed panic attack  Problem #  2:  FIBROMYALGIA (ICD-729.1) Stable on present regimen. No adverse side effects by history or exam.  Plan - 3 months refills.   Her updated medication list for this problem includes:    Methadone Hcl 10 Mg Tabs (Methadone hcl) ...Marland Kitchen. Take a total of 5046mhree times a day to be filled on or after 12/25/2010    Aleve 220 Mg Tabs (Naproxen sodium) ....Marland Kitchen 1 tablet two times a day for body aches    Ibuprofen 200 Mg Tabs (Ibuprofen) ....Marland Kitchen 2 tablets as needed for headaches  Complete Medication List: 1)  Sertraline Hcl 100 Mg Tabs (Sertraline hcl) ....Marland Kitchen1 by mouth am, 1/2 by mouth pm 2)  Methadone Hcl 10 Mg Tabs (Methadone hcl) .... Take a total of 67m71mree times a day to be filled on or after 12/25/2010 3)  Hydrochlorothiazide 25 Mg Tabs (Hydrochlorothiazide) .... Marland Kitchen by mouth once daily 4)  Lisinopril 20 Mg Tabs (Lisinopril) .... Marland Kitchen by mouth once daily 5)  Alprazolam 0.5 Mg Tabs (Alprazolam) .... Marland Kitchen by mouth q6 as needed panic attack 6)  Aleve 220 Mg Tabs (Naproxen sodium) .... Marland Kitchen tablet two times a day for body aches 7)  Imodium A-d 2 Mg Tabs (Loperamide hcl) .... As needed 8)  Ibuprofen 200 Mg Tabs (Ibuprofen) .... 2 tablets as needed for headaches Prescriptions: ALPRAZOLAM 0.5 MG TABS (ALPRAZOLAM) 1 by mouth q6 as needed panic attack  #60 x 2   Entered and Authorized by:   MichNeena Rhymes  Signed by:   MichNeena Rhymeson 10/22/2010   Method used:   Print then Give to Patient   RxID:   [id number] HCL 100 MG TABS (SERTRALINE HCL) 1 by mouth AM, 1/2 by mouth PM  #45 x 12   Entered and Authorized by:   MichNeena Rhymes  Signed by:   MichNeena Rhymeson 10/22/2010   Method used:   Electronically to        [facility])       [id number]  [id number]   Ph: [phone]   Fax: [phone]:   [id number] HCL 10 MG  TABS (METHADONE HCL) Take a total of 67mg32mee times a day To be filled on or  after 12/25/2010  #450 x 0   Entered by:   Ami Bullins CMA   Authorized by:   MichaNeena Rhymes Signed by:   Ami BCharlynne Cousinson 10/22/2010   Method used:   Print then Give to Patient   RxID:   [id number] HCL 10 MG  TABS (METHADONE HCL) Take a total of 67mg 35me times a day To be filled on or after 11/25/2010  #450 x 0   Entered by:   Ami Bullins CMA   Authorized by:   MichaeHeinz Knuckles  Toby Ayad MD   Signed by:   Charlynne Cousins CMA on 10/22/2010   Method used:   Print then Give to Patient   RxID:   [id number] METHADONE HCL 10 MG  TABS (METHADONE HCL) Take a total of 21m three times a day To be filled on or after 10/25/2010  #450 x 0   Entered by:   Ami Bullins CMA   Authorized by:   MNeena RhymesMD   Signed by:   ACharlynne CousinsCMA on 10/22/2010   Method used:   Print then Give to Patient   RxID:   [id number] HCL 10 MG  TABS (METHADONE HCL) Take a total of 551mthree times a day To be filled on or after 10/25/2010  #450 x 0   Entered by:   Ami Bullins CMA   Authorized by:   MiNeena RhymesD   Signed by:   AmCharlynne CousinsMA on 10/22/2010   Method used:   Print then Give to Patient   RxID:   [id number]  Orders Added: 1)  Est. Patient Level III [9[[id number]]

## 2010-11-01 LAB — URINALYSIS, ROUTINE W REFLEX MICROSCOPIC
Glucose, UA: NEGATIVE mg/dL
Nitrite: NEGATIVE
pH: 6.5 (ref 5.0–8.0)

## 2010-11-01 LAB — POCT I-STAT, CHEM 8
BUN: 6 mg/dL (ref 6–23)
Calcium, Ion: 1.12 mmol/L (ref 1.12–1.32)
Chloride: 105 mEq/L (ref 96–112)
Glucose, Bld: 93 mg/dL (ref 70–99)
HCT: 31 % — ABNORMAL LOW (ref 36.0–46.0)
Hemoglobin: 10.5 g/dL — ABNORMAL LOW (ref 12.0–15.0)
Sodium: 139 mEq/L (ref 135–145)
TCO2: 25 mmol/L (ref 0–100)

## 2010-11-01 LAB — POCT PREGNANCY, URINE: Preg Test, Ur: NEGATIVE

## 2010-11-01 LAB — DIFFERENTIAL
Eosinophils Absolute: 0 10*3/uL (ref 0.0–0.7)
Monocytes Absolute: 0.6 10*3/uL (ref 0.1–1.0)
Monocytes Relative: 5 % (ref 3–12)
Neutrophils Relative %: 85 % — ABNORMAL HIGH (ref 43–77)
Smear Review: ADEQUATE

## 2010-11-01 LAB — CBC
Hemoglobin: 9 g/dL — ABNORMAL LOW (ref 12.0–15.0)
RDW: 17 % — ABNORMAL HIGH (ref 11.5–15.5)
WBC: 12 10*3/uL — ABNORMAL HIGH (ref 4.0–10.5)

## 2010-12-22 NOTE — H&P (Signed)
NAMEDAYLANI, DEBLOIS            ACCOUNT NO.:  [id number]   MEDICAL RECORD NO.:  [id number]          PATIENT TYPE:  INP   LOCATION:  5123                         FACILITY:  [facility]   PHYSICIAN:  Sammuel Hines. Daiva Nakayama, M.D. DATE OF BIRTH:  08/12/1965   DATE OF ADMISSION:  04/23/2008  DATE OF DISCHARGE:                              HISTORY & PHYSICAL   [name] is a 45 year old white female who presents to the emergency  department tonight with right lower quadrant pain that started on  Friday.  The pain has gotten more severe over the last several days.  Pain has been associated with fever.  No nausea and vomiting.  No  diarrhea or dysuria.   PAST MEDICAL HISTORY:  Hypertension, fibromyalgia, and chronic back  pain.   PAST SURGICAL HISTORY:  Significant for C-section.   MEDICATIONS:  Methadone, Cymbalta, and diuretics.   ALLERGIES:  No known drug allergies.   SOCIAL HISTORY:  She smokes about half a pack of cigarettes a day and  denies any alcohol use.   FAMILY HISTORY:  Noncontributory.   PHYSICAL EXAMINATION:  VITAL SIGNS:  Temperature 98.5, pulse 84, blood  pressure 121/74.  GENERAL:  She is a well-developed, well-nourished white female in no  acute distress.  SKIN:  Warm and dry.  No jaundice.  HEENT:  Eyes, her extraocular movements are intact.  Pupils are equal,  round, and reactive to light.  Sclerae nonicteric.  LUNGS:  Clear bilaterally with no use of accessory inspiratory muscles.  HEART:  Regular rate and rhythm with impulse in the left chest.  ABDOMEN:  Severely tender in the right upper quadrant with guarding.  No  palpable mass or hepatosplenomegaly.  EXTREMITIES:  No cyanosis, clubbing, or edema.  Good strength in arms  and legs.  PSYCHOLOGIC:  She is alert and oriented x3 with no evidence of anxiety  or depression.   On review of her lab work, significant for a white count 17,000.  Her CT  scan was reviewed with the radiologist and was significant for an  enlarged inflamed appendix with some phlegmon fluid around it consistent  with a perforation.   ASSESSMENT/PLAN:  This is a 45 year old white female with possible  perforated appendicitis.  Because of risk of sepsis and death, I think  she would benefit from surgery to have her appendix removed.  I have  discussed with her in  detail the risks and benefits of operation to remove the appendix, as  well as some of the technical aspects.  She understands and wished to  proceed.  We will start some antibiotics, and obtain some preoperative  lab work in preparation to do this tonight.      Sammuel Hines. Daiva Nakayama, M.D.  Electronically Signed     PST/MEDQ  D:  04/24/2008  T:  04/24/2008  Job:  [id number]

## 2010-12-22 NOTE — Discharge Summary (Signed)
Emily, [name]            ACCOUNT NO.:  [id number]   MEDICAL RECORD NO.:  [id number]          PATIENT TYPE:  OIB   LOCATION:  5123                         FACILITY:  [facility]   PHYSICIAN:  Isabel Caprice. Hassell Done, MD  DATE OF BIRTH:  1966/02/26   DATE OF ADMISSION:  04/23/2008  DATE OF DISCHARGE:  04/26/2008                               DISCHARGE SUMMARY   CONSULTS:  There were none.   PROCEDURES:  Laparoscopic appendectomy by Dr. Marlou Starks on April 24, 2008.   REASON FOR ADMISSION:  Ms. Emily [name] is a 45 year old white female who  presented to the emergency department with right lower quadrant  abdominal pain, which started on Friday.  The pain has gotten worse over  the last several days and has been associated with fever, however, she  has not had any nausea, vomiting, diarrhea, or dysuria.  CT scan was  obtained, which showed possible perorated appendicitis, and therefore,  the patient was taken to the OR for a laparoscopic appendectomy.  Please  see admitting history and physical for further details.   ADMITTING DIAGNOSES:  1. Possible perforated appendicitis.  2. Hypertension.  3. Fibromyalgia.  4. Chronic back pain.   HOSPITAL COURSE:  After the patient was admitted and taken to the  operating room for a laparoscopic appendectomy which the patient  tolerated well, she was brought back up to regular floor.  By  postoperative day one-half, the patient was tolerating Jell-O and sips  of clear liquids.  At this time, she was continued on her Colbert Ewing and was  kept for an additional day based on the patient having a perforated  appendicitis.  On postoperative day 2, the patient had a low-grade  temperature of 99, and therefore, the patient was kept an extra day to  make sure the patient was afebrile for 24 hours and a CBC was rechecked  to see if her white count had decreased.  At this time, her diet was  advanced to full liquid diet, that was on postoperative day 1.  On  postoperative day 2, the patient's white count had decreased from 17,000  to 9800.  She was feeling much better and tolerating a full liquid diet  with no nausea or vomiting.  She was still complaining of some abdominal  soreness, but this had improved.  At this time, her diet was advanced to  regular diet and pending she tolerates this without any nausea or  vomiting, she will be discharged home after lunch.   DISCHARGE DIAGNOSES:  1. Perforated appendicitis.  2. Status post laparoscopic appendectomy.  3. Hypertension.  4. Fibromyalgia.  5. Chronic back pain.   DISCHARGE MEDICATIONS:  1. The patient was told that she may continue taking methadone 50 mg 3      times a day.  2. Cymbalta 60 mg daily.  3. HCTZ 12.5 mg daily.  4. New prescriptions were also given for Vicodin 5/325, 1-2 tablets      q.4 h. as needed for pain.  5. Augmentin 875 mg 1 tablet p.o. b.i.d. for 7 days.   DISCHARGE INSTRUCTIONS:  The patient is  informed that she may return to  work after 1 week.  She has no dietary restrictions and she is told that  she may increase her activity slowly and she may walk up steps.  She is  also able to shower, however, she is not to bathe for the next 2 weeks,  and she is also not to lift anything greater than approximately 10-15  pounds for the next 2 weeks.  She is to call our office if she gets a  fever greater than 101.5, new or worsening abdominal pain, redness, or  pus-like drainage from any of her incisions.  Otherwise, she is to  follow up at the [facility] to see either myself or Ebony Hail on May 07, 2008, at 3:15 p.m.  However, she is told to call and be seen before  then if she exhibits any symptoms as previously mentioned.      Henreitta Cea, PA      Isabel Caprice Hassell Done, MD  Electronically Signed    KEO/MEDQ  D:  04/26/2008  T:  04/26/2008  Job:  [id number]

## 2010-12-22 NOTE — Op Note (Signed)
NAMEPHIONA, RAMNAUTH            ACCOUNT NO.:  [id number]   MEDICAL RECORD NO.:  [id number]          PATIENT TYPE:  INP   LOCATION:  1824                         FACILITY:  [facility]   PHYSICIAN:  Sammuel Hines. Daiva Nakayama, M.D. DATE OF BIRTH:  1965-10-06   DATE OF PROCEDURE:  04/24/2008  DATE OF DISCHARGE:                               OPERATIVE REPORT   PREOPERATIVE DIAGNOSIS:  Perforated appendicitis.   POSTOPERATIVE DIAGNOSIS:  Perforated appendicitis.   PROCEDURE:  Laparoscopic appendectomy.   SURGEON:  Sammuel Hines. Daiva Nakayama, MD   ANESTHESIA:  General endotracheal.   PROCEDURE:  After informed consent was obtained, the patient was brought  to the operating room, placed in the supine position on the operating  room table.  After adequate induction of general anesthesia, the  patient's abdomen was prepped with Betadine and draped in usual sterile  manner.  The area below the umbilicus was infiltrated with 0.25%  Marcaine.  A small incision was made with a #15 blade knife.  This  incision was carried down through the subcutaneous tissue bluntly with a  hemostat and [employer] retractors until the linea alba was identified.  The linea alba was incised with a #15 blade knife and each side was  grasped with Kocher clamps and elevated anteriorly.  The preperitoneal  space was then probed bluntly with a hemostat until the peritoneum was  opened and access was gained to the abdominal cavity.  A 0 Vicryl  pursestring stitch was placed in the fascia around the opening .  A  Hasson cannula was placed through the opening and anchored in place at  the previously placed Vicryl pursestring stitch.  The abdomen was then  insufflated with carbon dioxide.  The patient was placed in  Trendelenburg position and rotated with the right side up.  Next, the  suprapubic region was infiltrated with 0.25% Marcaine.  A small incision  was made with a #15 blade knife and a 10-mm port was placed bluntly  through this  incision into the abdominal cavity under direct vision.  Site was then chosen on the right upper quadrant for placement of a 5-mm  port.  This area was infiltrated with 0.25% Marcaine.  A small stab  incision was made with a #15 blade knife and a 5-mm port was placed  bluntly through this incision into the abdominal cavity under direct  vision.  Camera was then moved to the suprapubic port using a Glassman  grasper and harmonic scalpel.  The right lower quadrant area was  inspected.  The appendix was able to be identified.  It was very  enlarged and inflamed.  We were able to with blunt dissection mobilize  the small bowel loops and omentum off of the appendix.  At this point,  we were able to identify that the appendix was perforated along its  midportion, but the base of the appendix where it joined with the cecum  was healthy and intact.  The mesoappendix was taken down sharply with  the harmonic scalpel.  Next, a laparoscopic blue load 6-row stapler and  Endo GIA stapler was then placed  through the Hasson cannula.  The  appendix was elevated with a Glassman grasper.  The stapling device was  placed across the base of the appendix at its junction with the cecum  clamped and fired thereby dividing the base of the appendix between the  staple lines.  A laparoscopic bag was inserted through the Hasson  cannula.  The appendix was placed in the bag and the bag was sealed.  The abdomen was then irrigated with copious amounts of saline until the  effluent was clear.  The bag with the appendix was then removed through  the infraumbilical port site without difficulty along with the Hasson  cannula.  The fascial defect was closed with the previously used Vicryl  pursestring stitch as well as with a couple of more interrupted 0 Vicryl  stitches.  The rest of ports were then removed under direct vision and  were found to be hemostatic.  The gas was allowed to escape.  The skin  incisions were all  irrigated with copious amounts of saline as well, and  then the  skin incisions were closed with interrupted 4-0 Monocryl subcuticular  stitches.  Dermabond dressings were applied.  The patient tolerated the  procedure well.  At the end of the case, all needle, sponge, and  instrument counts were correct.  The patient was then awakened and taken  to the recovery room in stable condition.      Sammuel Hines. Daiva Nakayama, M.D.  Electronically Signed     PST/MEDQ  D:  04/24/2008  T:  04/24/2008  Job:  [id number]

## 2010-12-22 NOTE — Discharge Summary (Signed)
Emily [name], Emily [name]            ACCOUNT NO.:  [id number]   MEDICAL RECORD NO.:  [id number]          PATIENT TYPE:  OIB   LOCATION:  5123                         FACILITY:  [facility]   PHYSICIAN:  Isabel Caprice. Hassell Done, MD  DATE OF BIRTH:  [birth year]   DATE OF ADMISSION:  04/23/2008  DATE OF DISCHARGE:  04/26/2008                               DISCHARGE SUMMARY   ADDENDUM   After discussing with Dr. Lilia Pro, the patient's prescription for Vicodin  has been taken back.  Apparently, this was an antagonist to the  patient's methadone and will not be of any appropriateness.  Therefore,  at this time, the patient is encouraged to discontinue with her  methadone as needed for her pain.      Henreitta Cea, PA      Isabel Caprice Hassell Done, MD  Electronically Signed    KEO/MEDQ  D:  04/26/2008  T:  04/27/2008  Job:  [id number]

## 2010-12-25 NOTE — Op Note (Signed)
[place]  Patient:    Emily [name], Emily [name]                   MRN: [id number] Proc. Date: 12/29/99 Adm. Date:  [id number] Disc. Date: [id number] Attending:  Cristopher Peru CC:         Fransico Him, M.D., [facility]             Otilio Connors, M.D., [place], [place]                           Operative Report  PROCEDURE:  Head up tilt table testing.  CARDIOLOGIST:  Champ Mungo. Lovena Le, M.D. [facility]  INDICATIONS: Chiari malformation and inappropriate sinus tachycardia and fairly labile blood pressures.  I.  INTRODUCTION:     The patient is a 45 year old woman who has a history of fibromyalgia     and cervical vertebral compressions. 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  She was referred to     Dr. Radford Pax for head up tilting.  Unfortunately, Dr. Radford Pax has been on     maternity leave and has asked me to supervise this procedure.  I note     that she had a normal 2-D echocardiogram as part of her workup and a     no history of thyroid disease.  II. PROCEDURE:     After informed consent was obtained, the patient was taken to the     diagnostic EP lab in the fasting state.  After the usual preparation,     she was placed in the supine position, and her blood pressure there was     135/84 with a heart rate of 103.  She was subsequently tilted to the     70 degree head up position, and her blood pressure remained stable in     the 125 to 140 range.  Initially, her blood pressure went from 103 to     112, and over the course of the next 40 minutes gradually increased from     approximately 112 up to a high of 150.  It typically stayed in the 135     to 145 range during the tilting procedure.  She was maintained in this     position for  45 minutes.  At 30 minutes into the testing, she became     diaphoretic and flushed but had no abnormalities in her vital signs other     than a very minimal drop in her blood pressure from 135 down to 125 mmHg.     Her heart rate did fluctuate at that point from 135 to 150.  Again, she     was allowed to maintain this position and had no additional frank syncope.     After 45 minutes of tilting, she was placed back in the supine position     and returned to her room in good condition.  III.COMPLICATIONS:  None.  IV. RESULTS: This head up tilt table test demonstrates on evidence of     neurocardiogenic syncope.  She did certainly have inappropriate sinus     tachycardia with marked decrease in her sinus rates, and the etiology of  this could be secondary to venous pooling and/or periautonomic dysfunction     secondary to her Chiari malformation. DD:  12/29/99 TD:  01/02/00 Job: [id number]

## 2011-01-18 ENCOUNTER — Ambulatory Visit: Payer: Self-pay | Admitting: Internal Medicine

## 2011-01-19 ENCOUNTER — Encounter: Payer: Self-pay | Admitting: Internal Medicine

## 2011-01-20 ENCOUNTER — Ambulatory Visit (INDEPENDENT_AMBULATORY_CARE_PROVIDER_SITE_OTHER): Payer: Self-pay | Admitting: Internal Medicine

## 2011-01-20 ENCOUNTER — Other Ambulatory Visit: Payer: Self-pay | Admitting: Internal Medicine

## 2011-01-20 ENCOUNTER — Other Ambulatory Visit (INDEPENDENT_AMBULATORY_CARE_PROVIDER_SITE_OTHER): Payer: Self-pay

## 2011-01-20 DIAGNOSIS — Z0000 Encounter for general adult medical examination without abnormal findings: Secondary | ICD-10-CM

## 2011-01-20 DIAGNOSIS — E049 Nontoxic goiter, unspecified: Secondary | ICD-10-CM

## 2011-01-20 DIAGNOSIS — F341 Dysthymic disorder: Secondary | ICD-10-CM

## 2011-01-20 DIAGNOSIS — G894 Chronic pain syndrome: Secondary | ICD-10-CM

## 2011-01-20 DIAGNOSIS — I10 Essential (primary) hypertension: Secondary | ICD-10-CM

## 2011-01-20 LAB — COMPREHENSIVE METABOLIC PANEL
ALT: 10 U/L (ref 0–35)
Albumin: 4.2 g/dL (ref 3.5–5.2)
CO2: 29 mEq/L (ref 19–32)
Calcium: 9.5 mg/dL (ref 8.4–10.5)
Chloride: 104 mEq/L (ref 96–112)
GFR: 91.16 mL/min (ref >60.00)
Potassium: 4.3 mEq/L (ref 3.5–5.1)
Sodium: 141 mEq/L (ref 135–145)
Total Bilirubin: 0.3 mg/dL (ref 0.3–1.2)
Total Protein: 7.5 g/dL (ref 6.0–8.3)

## 2011-01-20 LAB — LIPID PANEL: Total CHOL/HDL Ratio: 7

## 2011-01-20 LAB — CBC WITH DIFFERENTIAL/PLATELET
Basophils Relative: 1.2 % (ref 0.0–3.0)
Eosinophils Absolute: 0.1 10*3/uL (ref 0.0–0.7)
Eosinophils Relative: 1.3 % (ref 0.0–5.0)
HCT: 34.8 % — ABNORMAL LOW (ref 36.0–46.0)
Lymphs Abs: 1.6 10*3/uL (ref 0.7–4.0)
MCHC: 32.8 g/dL (ref 30.0–36.0)
MCV: 73.9 fl — ABNORMAL LOW (ref 78.0–100.0)
Monocytes Absolute: 0.6 10*3/uL (ref 0.1–1.0)
RBC: 4.71 Mil/uL (ref 3.87–5.11)
WBC: 7.7 10*3/uL (ref 4.5–10.5)

## 2011-01-20 MED ORDER — METHADONE HCL 10 MG PO TABS: 50.0000 mg | ORAL_TABLET | Freq: Three times a day (TID) | ORAL | Status: DC

## 2011-01-20 NOTE — Progress Notes (Signed)
  Subjective:    Patient ID: Emily [name], female    DOB: 1966-06-01, 45 y.o.   MRN: [id number]  HPI Ms. Emily [name] presents for follow-up. She reports that her son who ran away has returned but he did drug test positive for [facility] and his father kicked him out on his own. This has been a real emotional stress. Medically she is doing OK: alprazolam is helping . She has been taking this at night - up to 1 mg and then during the day prn, rare occurrence. Her chronic pain has been a little worse. She is walking and this helps. No bowel problems.    Review of Systems     Objective:   Physical Exam Vitals reviewed. Gen'l: well nourished, well developed, overweight white Woman in no distress HEENT - [place]/AT, EACs/TMs normal, oropharynx with native dentition in good condition, no buccal or palatal lesions, posterior pharynx clear, mucous membranes moist. C&S clear, PERRLA, fundi - normal Neck - supple, no thyromegaly Nodes- negative submental, cervical, supraclavicular regions Chest - no deformity, no CVAT Lungs - cleat without rales, wheezes. No increased work of breathing Breast - skin normal, nipples without discharge, no fixed mass or lesion. No axillary adenopathy Cardiovascular - regular rate and rhythm, quiet precordium, no murmurs, rubs or gallops, 2+ radial, DP and PT pulses Abdomen - BS+ x 4, no HSM, no guarding or rebound or tenderness Pelvic - deferred to normal exam 1 year ago Rectal - deferred  Extremities - no clubbing, cyanosis, edema or deformity.  Neuro - A&O x 3, CN II-XII normal, motor strength normal and equal, DTRs 2+ and symmetrical biceps, radial, and patellar tendons. Cerebellar - no tremor, no rigidity, fluid movement and normal gait. Derm - Head, neck, back, abdomen and extremities without suspicious lesions. Multiple excoriations back, arms.  Lab Results  Component Value Date   WBC 7.7 01/20/2011   HGB 11.4* 01/20/2011   HCT 34.8* 01/20/2011   PLT 336.0 01/20/2011   CHOL 239* 01/20/2011   TRIG 330.0* 01/20/2011   HDL 36.70* 01/20/2011   LDLDIRECT 148.9 01/20/2011   ALT 10 01/20/2011   AST 24 01/20/2011   NA 141 01/20/2011   K 4.3 01/20/2011   CL 104 01/20/2011   CREATININE 0.7 01/20/2011   BUN 8 01/20/2011   CO2 29 01/20/2011   TSH 1.85 01/20/2011           Assessment & Plan:

## 2011-01-21 DIAGNOSIS — Z0000 Encounter for general adult medical examination without abnormal findings: Secondary | ICD-10-CM | POA: Insufficient documentation

## 2011-01-21 NOTE — Assessment & Plan Note (Signed)
A little stressful lately with family and unemployment issues but holding her own.

## 2011-01-21 NOTE — Assessment & Plan Note (Signed)
Stable. No neurologic or GI complications.  Refill prescriptions provided.

## 2011-01-21 NOTE — Assessment & Plan Note (Signed)
Stable with no reported progressive enlargement of the neck. Thyroid function is normal  Plan - no change in treatment

## 2011-01-21 NOTE — Assessment & Plan Note (Signed)
Stable BP readings.  Continue present regimen

## 2011-01-21 NOTE — Assessment & Plan Note (Signed)
Interval medical history is stable. Limited exam is normal including normal breast exam. She is due for mammography. Lab results with mild anemia, elevated LDL cholesterol at 146+, above goal of 130. Plan is for life-style management with a low fat diet and increase aerobic, low impact exercise, i.e. Water exercise or walking.

## 2011-01-22 LAB — DRUG SCREEN, URINE

## 2011-01-25 ENCOUNTER — Encounter: Payer: Self-pay | Admitting: Internal Medicine

## 2011-02-01 ENCOUNTER — Encounter: Payer: Self-pay | Admitting: Internal Medicine

## 2011-03-02 ENCOUNTER — Telehealth: Payer: Self-pay | Admitting: *Deleted

## 2011-03-02 DIAGNOSIS — G894 Chronic pain syndrome: Secondary | ICD-10-CM
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
## 2011-03-02 NOTE — Telephone Encounter (Signed)
She is overdue for an office visit in regard to last drug screen that was negative for methadone!! She definitely needs an ov.

## 2011-03-03 NOTE — Telephone Encounter (Signed)
No special testing that I know of. I tried to enter a urine screen for methadone but could not pull up a reporting source - the order is there but incomplete. She will need to come in today! For urine screen. Thanks

## 2011-03-03 NOTE — Telephone Encounter (Signed)
Spoke with pt and she states she takes her methadone everyday. She went on to mention that an extensive lab has to be done in order to see if she has methadone in her system. She states she does not need an appointment at this time. FYI

## 2011-03-04 NOTE — Telephone Encounter (Signed)
Completed lab order

## 2011-03-04 NOTE — Telephone Encounter (Signed)
Notified pt with md response. She states she was leaving going out of town, but can come in [place] to give specimen...03/04/11@2 :58pm/LMB

## 2011-03-05 NOTE — Telephone Encounter (Signed)
Will send a letter

## 2011-03-05 NOTE — Telephone Encounter (Signed)
Letter to patient

## 2011-03-05 NOTE — Telephone Encounter (Signed)
See other note

## 2011-03-06 NOTE — Telephone Encounter (Signed)
Letter mailed Friday

## 2011-03-12 ENCOUNTER — Other Ambulatory Visit: Payer: Self-pay

## 2011-03-12 ENCOUNTER — Other Ambulatory Visit: Payer: Self-pay | Admitting: Internal Medicine

## 2011-03-12 DIAGNOSIS — G894 Chronic pain syndrome: Secondary | ICD-10-CM

## 2011-03-17 ENCOUNTER — Ambulatory Visit (INDEPENDENT_AMBULATORY_CARE_PROVIDER_SITE_OTHER): Payer: Self-pay | Admitting: Internal Medicine

## 2011-03-17 VITALS — BP 190/110 | HR 94 | Temp 98.7°F | Wt 155.0 lb

## 2011-03-17 DIAGNOSIS — R899 Unspecified abnormal finding in specimens from other organs, systems and tissues: Secondary | ICD-10-CM

## 2011-03-17 DIAGNOSIS — R6889 Other general symptoms and signs: Secondary | ICD-10-CM

## 2011-03-18 NOTE — Progress Notes (Signed)
  Subjective:    Patient ID: Emily [name], female    DOB: [birth year], 45 y.o.   MRN: [id number]  HPI Emily [name] presents to discuss recent drug screen which came back as negative for methadone and benzo's. She is adamant that she takes her medication as directed. Called [facility] to track down results: there was not a good specimen and there was no test run. It was incorrectly transmitted as a negative study. Issue resolved.   (greater than 50% of visit 20 min  spent on education and counseling and researching lab values)    Review of Systems     Objective:   Physical Exam        Assessment & Plan:

## 2011-04-14 ENCOUNTER — Ambulatory Visit (INDEPENDENT_AMBULATORY_CARE_PROVIDER_SITE_OTHER): Payer: Self-pay | Admitting: Internal Medicine

## 2011-04-14 DIAGNOSIS — G894 Chronic pain syndrome: Secondary | ICD-10-CM

## 2011-04-14 DIAGNOSIS — F341 Dysthymic disorder: Secondary | ICD-10-CM

## 2011-04-14 DIAGNOSIS — I10 Essential (primary) hypertension: Secondary | ICD-10-CM

## 2011-04-14 MED ORDER — METHADONE HCL 10 MG PO TABS: 50.0000 mg | ORAL_TABLET | Freq: Three times a day (TID) | ORAL | Status: DC

## 2011-04-14 MED ORDER — ALPRAZOLAM 0.5 MG PO TABS: 0.5000 mg | ORAL_TABLET | Freq: Four times a day (QID) | ORAL | Status: DC | PRN

## 2011-04-17 NOTE — Assessment & Plan Note (Signed)
Stable on present regimen with no complications. Rx for three months provided.

## 2011-04-17 NOTE — Assessment & Plan Note (Signed)
BP Readings from Last 3 Encounters:  04/14/11 142/88  03/17/11 190/110  01/20/11 180/120   Readings were up and are better today. She is asked to continue to monitor BP  And report back if there is any recurrent elevations.

## 2011-04-17 NOTE — Progress Notes (Signed)
  Subjective:    Patient ID: Emily [name], female    DOB: 17-Jan-1966, 45 y.o.   MRN: [id number]  HPI Emily [name] presents for med refill and follow-up. In the interval since her last visit she has been doing well. She did get some scratches to her face and is instructed in care and signs of infection. She has not had any bowel problems, somnolence or other adverse med side effects.  I have reviewed the patient's medical history in detail and updated the computerized patient record.    Review of Systems System review is negative for any constitutional, cardiac, pulmonary, GI or neuro symptoms or complaints     Objective:   Physical Exam Vitals reviewed - BP up just a little Emily [name] in no distress. HEENT - C&S clear Resp - normal Cor- RRR Derm - minor facial scratches.        Assessment & Plan:

## 2011-04-17 NOTE — Assessment & Plan Note (Signed)
She reports that she is doing OK. No thoughts of self-harm, a hopeful outlook.

## 2011-05-10 LAB — URINALYSIS, ROUTINE W REFLEX MICROSCOPIC
Nitrite: NEGATIVE
Specific Gravity, Urine: 1.008
Urobilinogen, UA: 0.2

## 2011-05-10 LAB — COMPREHENSIVE METABOLIC PANEL
Alkaline Phosphatase: 125 — ABNORMAL HIGH
BUN: 6
CO2: 26
Chloride: 100
GFR calc non Af Amer: >60
Glucose, Bld: 99
Potassium: 4.4
Total Bilirubin: 0.6

## 2011-05-10 LAB — CBC
HCT: 34.5 — ABNORMAL LOW
Hemoglobin: 11 — ABNORMAL LOW
Platelets: 246
WBC: 17 — ABNORMAL HIGH
WBC: 9.8

## 2011-05-10 LAB — DIFFERENTIAL
Basophils Absolute: 0.1
Basophils Relative: 1
Monocytes Absolute: 1.4 — ABNORMAL HIGH
Neutro Abs: 14.4 — ABNORMAL HIGH
Neutrophils Relative %: 85 — ABNORMAL HIGH

## 2011-05-10 LAB — PREGNANCY, URINE: Preg Test, Ur: NEGATIVE

## 2011-05-10 LAB — PROTIME-INR: INR: 1

## 2011-05-10 LAB — APTT: aPTT: 40 — ABNORMAL HIGH

## 2011-05-17 ENCOUNTER — Telehealth: Payer: Self-pay | Admitting: *Deleted

## 2011-05-17 NOTE — Telephone Encounter (Signed)
1. OK for alprazolam #90 1 po tid prn 5 refills 2. OK for methadone as previously prescribed with 3 prescriptions

## 2011-05-17 NOTE — Telephone Encounter (Signed)
Patient requesting RF of xanax - She would like 1 mth supply. Last Rx given was for #30, she would like increase in qty. Pt now has Medicaid, they will only fill one RX per month for each medication.   Also needs PA on methadone, Pt has tried OxyContin in the past, DX: Chronic pain syndrome, chronic LBP, Fibromyalgia.

## 2011-05-19 MED ORDER — ALPRAZOLAM 0.5 MG PO TABS: 0.5000 mg | ORAL_TABLET | Freq: Three times a day (TID) | ORAL | Status: DC | PRN

## 2011-06-10 ENCOUNTER — Telehealth: Payer: Self-pay | Admitting: *Deleted

## 2011-06-10 NOTE — Telephone Encounter (Signed)
Pt left voice message requesting the status of her prior authorization for Methadone. States she has been paying out of pocket since August and would like PA to retro to that date. Pt states we may call [phone] to obtain PA if not already done.

## 2011-06-14 NOTE — Telephone Encounter (Signed)
Called for PA, I spoke with krystal she will fax over form to be filled out for PA

## 2011-06-15 ENCOUNTER — Telehealth: Payer: Self-pay | Admitting: *Deleted

## 2011-06-15 NOTE — Telephone Encounter (Signed)
[place] for z-pak as directed

## 2011-06-15 NOTE — Telephone Encounter (Signed)
Pt states that she called to get OV and was told nothing availble until Thursday, 06/17/11; she did not take the appointment offered. Pt c/o cough, slight fever, congestion-requesting ABX to pharmacy.

## 2011-06-16 MED ORDER — AZITHROMYCIN 250 MG PO TABS: ORAL_TABLET | ORAL | Status: AC

## 2011-06-16 NOTE — Telephone Encounter (Signed)
Patient has been notified that Rx has been called in.

## 2011-06-17 NOTE — Telephone Encounter (Signed)
Received PA form. Form was filled out and faxed over this morning.

## 2011-06-18 NOTE — Telephone Encounter (Signed)
Received fax from [facility] and [facility] with approval for methadone 10 mg with a qty of #450 for 1 year

## 2011-07-19 ENCOUNTER — Ambulatory Visit (INDEPENDENT_AMBULATORY_CARE_PROVIDER_SITE_OTHER): Payer: Self-pay | Admitting: Internal Medicine

## 2011-07-19 DIAGNOSIS — IMO0001 Reserved for inherently not codable concepts without codable children: Secondary | ICD-10-CM

## 2011-07-19 DIAGNOSIS — I10 Essential (primary) hypertension: Secondary | ICD-10-CM

## 2011-07-19 MED ORDER — HYDROCHLOROTHIAZIDE 25 MG PO TABS: 25.0000 mg | ORAL_TABLET | Freq: Every day | ORAL | Status: DC

## 2011-07-19 MED ORDER — LISINOPRIL 20 MG PO TABS: 20.0000 mg | ORAL_TABLET | Freq: Every day | ORAL | Status: DC

## 2011-07-19 MED ORDER — METHADONE HCL 10 MG PO TABS: 50.0000 mg | ORAL_TABLET | Freq: Three times a day (TID) | ORAL | Status: DC

## 2011-07-19 NOTE — Progress Notes (Signed)
  Subjective:    Patient ID: Emily [name], female    DOB: May 16, 1966, 45 y.o.   MRN: [id number]  HPI Ms. Emily [name] presents for early refill on her medication - due Dec 14th - she is having to leave for [place] - her mother has had a catastrophic medical problem.  Her blood pressure is very high today but she has been out of medication for 2-3 days. She reports normally she runs a BP 120's/80's  I have reviewed the patient's medical history in detail and updated the computerized patient record.    Review of Systems System review is negative for any constitutional, cardiac, pulmonary, GI or neuro symptoms or complaints other than as described in the HPI.     Objective:   Physical Exam Vitals - very high BP noted Gen'l - WNWD white woman in no distress HEENT - PERRLA, Fundi - w/o exudates, hemorrhages or papilledema. Cor - RRR  Pulm - normal respirations       Assessment & Plan:

## 2011-07-20 NOTE — Assessment & Plan Note (Signed)
Provided with methadone Rx's for three months. She is stable. No adverse drug effects noted.

## 2011-07-20 NOTE — Assessment & Plan Note (Signed)
BP Readings from Last 3 Encounters:  07/19/11 210/120  04/14/11 142/88  03/17/11 190/110   Patient's BP will really jump when she misses taking her medications. No signs of malignant hypertension on exam.  Plan - she is adamantly instructed to pick up her BP meds today and resume medications.

## 2011-08-20 ENCOUNTER — Telehealth: Payer: Self-pay | Admitting: *Deleted

## 2011-08-20 ENCOUNTER — Other Ambulatory Visit: Payer: Self-pay | Admitting: *Deleted

## 2011-08-20 MED ORDER — METHADONE HCL 10 MG PO TABS: 10.0000 mg | ORAL_TABLET | Freq: Three times a day (TID) | ORAL | Status: DC

## 2011-08-20 NOTE — Telephone Encounter (Signed)
Medication is early refill request due to patient having to go out of town for [employer] in [place]. [Rx due for fill 01.14.13] New Rx printed w/correction on refillable date [01.11.13] Rx fill early once. Changes had to be made to Rx for fill date, MD approval [in absence of PCP].

## 2011-08-20 NOTE — Telephone Encounter (Signed)
Ok Thx 

## 2011-08-20 NOTE — Telephone Encounter (Signed)
Refill request for methadone (Dolphine) 10mg  with zero refills, last refilled on 12.10.12. OK to refill?

## 2011-08-20 NOTE — Telephone Encounter (Signed)
Printed for MD signature.

## 2011-10-20 ENCOUNTER — Ambulatory Visit (INDEPENDENT_AMBULATORY_CARE_PROVIDER_SITE_OTHER): Payer: Self-pay | Admitting: Internal Medicine

## 2011-10-20 ENCOUNTER — Encounter: Payer: Self-pay | Admitting: Internal Medicine

## 2011-10-20 VITALS — BP 170/100 | HR 100 | Temp 97.2°F | Ht 60.0 in | Wt 145.0 lb

## 2011-10-20 DIAGNOSIS — G894 Chronic pain syndrome: Secondary | ICD-10-CM

## 2011-10-20 MED ORDER — METHADONE HCL 10 MG PO TABS: ORAL_TABLET | ORAL | Status: DC

## 2011-10-22 NOTE — Assessment & Plan Note (Signed)
Stable and doing well. Rx's x 3 months prepared.

## 2011-10-22 NOTE — Progress Notes (Signed)
  Subjective:    Patient ID: Emily [name], female    DOB: Aug 13, 1965, 46 y.o.   MRN: [id number]  HPI Mr.s Emily [name] presents for refill of her chronic pain medication. In the interval since her last visit she has been doing well: no intercurrent illness, injury or surgery. She has had no adverse medication problems: no hypersomnolence or GI problems. She is hopeful that she will find work in the next week or so. Her AWOL son has returned home and is doing OK.  PMH, FamHx and SocHx reviewed for any changes and relevance.    Review of Systems Constitutional:  Negative for fever, chills, activity change and unexpected weight change.  HEENT:  Negative for hearing loss, ear pain, congestion, neck stiffness and postnasal drip. Negative for sore throat or swallowing problems. Negative for dental complaints.   Eyes: Negative for vision loss or change in visual acuity.  Respiratory: Negative for chest tightness and wheezing. Negative for DOE.   Cardiovascular: Negative for chest pain or palpitations. No decreased exercise tolerance Gastrointestinal: No change in bowel habit. No bloating or gas. No reflux or indigestion Genitourinary: Negative for urgency, frequency, flank pain and difficulty urinating.  Musculoskeletal: Negative for myalgias, back pain, arthralgias and gait problem.  Neurological: Negative for dizziness, tremors, weakness and headaches.  Hematological: Negative for adenopathy.  Psychiatric/Behavioral: Negative for behavioral problems and dysphoric mood.       Objective:   Physical Exam Filed Vitals:   10/20/11 1359  BP: 170/100  Pulse: 100  Temp: 97.2 F (36.2 C)   Wt Readings from Last 3 Encounters:  10/20/11 145 lb (65.772 kg)  07/19/11 157 lb (71.215 kg)  04/14/11 152 lb (68.947 kg)   Gen'l- WNWD white woman who has visibly lost weight HEENT- C&S clear, PERRLA Cor- RRR PUlm - normal respirations. Neuor - A&O x 3, normal gait and station.       Assessment  & Plan:

## 2011-11-16 ENCOUNTER — Telehealth: Payer: Self-pay

## 2011-11-16 NOTE — Telephone Encounter (Signed)
Patient called stating that her mother is sick out of town and she will be leaving today to care for her. Her current methadone rx cannot be filled until 11/19/11 and she would like to know if MD will approve early. Per MD ok to fill today. Pharmacy notified

## 2011-12-09 ENCOUNTER — Other Ambulatory Visit: Payer: Self-pay | Admitting: Internal Medicine

## 2011-12-10 ENCOUNTER — Other Ambulatory Visit: Payer: Self-pay | Admitting: *Deleted

## 2011-12-10 MED ORDER — ALPRAZOLAM 0.5 MG PO TABS: 0.5000 mg | ORAL_TABLET | Freq: Three times a day (TID) | ORAL | Status: DC | PRN

## 2011-12-10 NOTE — Telephone Encounter (Signed)
Refill faxed.xannax  Collie Siad

## 2011-12-20 ENCOUNTER — Other Ambulatory Visit: Payer: Self-pay | Admitting: Internal Medicine

## 2012-01-18 ENCOUNTER — Ambulatory Visit: Payer: Self-pay | Admitting: Internal Medicine

## 2012-01-20 ENCOUNTER — Ambulatory Visit (INDEPENDENT_AMBULATORY_CARE_PROVIDER_SITE_OTHER): Payer: Self-pay | Admitting: Internal Medicine

## 2012-01-20 ENCOUNTER — Encounter: Payer: Self-pay | Admitting: Internal Medicine

## 2012-01-20 VITALS — BP 142/100 | HR 90 | Temp 97.6°F | Resp 16 | Ht 60.0 in | Wt 132.0 lb

## 2012-01-20 DIAGNOSIS — G894 Chronic pain syndrome: Secondary | ICD-10-CM

## 2012-01-20 MED ORDER — METHADONE HCL 10 MG PO TABS: ORAL_TABLET | ORAL | Status: DC

## 2012-01-20 NOTE — Assessment & Plan Note (Signed)
Stable and doing well. 3 months Rx provided. She is advised that if she is still in [place] caring for her mother in the next cycle we will mail Rxs to her w/o ov.

## 2012-01-20 NOTE — Progress Notes (Signed)
  Subjective:    Patient ID: Emily [name], female    DOB: [birth year], 46 y.o.   MRN: [id number]  HPI Ms. Emily [name] presents for med refill. She has been very stable. No GI problems, no somnolence.  Past Medical History  Diagnosis Date  . Fibromyalgia   . Goiter, unspecified   . Urinary tract infection, site not specified   . Spinal stenosis, unspecified region other than cervical   . Allergic rhinitis, cause unspecified   . Unspecified essential hypertension   . Lumbago   . Chronic pain syndrome    Past Surgical History  Procedure Date  . Cesarean section     X 2  . Urethral dilation 46 yrs old  . Craniectomy   . Laminectomy   . Appendectomy 9-09    Laproscopic- Dr Marlou Starks   Family History  Problem Relation Age of Onset  . Hyperlipidemia Mother   . Hypertension Mother   . Hyperlipidemia Father   . Hypertension Father   . Coronary artery disease Father   . Heart attack Father   . Cancer Neg Hx     colon, breast  . Diabetes Neg Hx    History   Social History  . Marital Status: Divorced    Spouse Name: N/A    Number of Children: N/A  . Years of Education: N/A   Occupational History  . Not on file.   Social History Main Topics  . Smoking status: Current Everyday Smoker  . Smokeless tobacco: Not on file  . Alcohol Use: Not on file  . Drug Use: Not on file  . Sexually Active: Not on file   Other Topics Concern  . Not on file   Social History Narrative  . No narrative on file       Review of Systems System review is negative for any constitutional, cardiac, pulmonary, GI or neuro symptoms or complaints other than as described in the HPI.     Objective:   Physical Exam Filed Vitals:   01/20/12 1206  BP: 142/100  Pulse: 90  Temp: 97.6 F (36.4 C)  Resp: 16   Wt Readings from Last 3 Encounters:  01/20/12 132 lb (59.875 kg)  10/20/11 145 lb (65.772 kg)  07/19/11 157 lb (71.215 kg)   Gen'l- WNWD white woman HEENT - C&S clear  PERRLA Cor -  RRR Pulm normal respirations.       Assessment & Plan:

## 2012-05-12 ENCOUNTER — Other Ambulatory Visit: Payer: Self-pay

## 2012-05-12 NOTE — Telephone Encounter (Signed)
Pt called requesting 3 mth refills of Methadone mailed to her address in [place], please advise.

## 2012-05-14 NOTE — Telephone Encounter (Signed)
OK. reuglar patient who is visitng her sick mother in [place]. This would be her usual Rx and it is ok to fill in [place] while she is there.

## 2012-05-15 MED ORDER — METHADONE HCL 10 MG PO TABS: ORAL_TABLET | ORAL | Status: DC

## 2012-05-15 NOTE — Telephone Encounter (Signed)
Rx PRINTED FOR REFILL OF METHADONE X 3. TO BE MAILED TO PATIENT AT [place] ADDRESS.

## 2012-05-16 ENCOUNTER — Telehealth: Payer: Self-pay | Admitting: *Deleted

## 2012-05-16 NOTE — Telephone Encounter (Signed)
Called patient at phone # [phone]. Patient states she has no cell phone coverage in this area.Instructed patient Rx for methadone were ready to mail to her and need address. Rx mailed to [facility] : [address]: [place], [place];  [address].

## 2012-06-22 ENCOUNTER — Other Ambulatory Visit: Payer: Self-pay | Admitting: *Deleted

## 2012-06-22 MED ORDER — SERTRALINE HCL 100 MG PO TABS: ORAL_TABLET | ORAL | Status: DC

## 2012-06-22 NOTE — Telephone Encounter (Signed)
R'cd fax from [facility] for refill of Sertraline.

## 2012-06-22 NOTE — Telephone Encounter (Signed)
Medication refill request.

## 2012-06-23 MED ORDER — ALPRAZOLAM 0.5 MG PO TABS: 0.5000 mg | ORAL_TABLET | Freq: Three times a day (TID) | ORAL | Status: DC | PRN

## 2012-07-03 ENCOUNTER — Other Ambulatory Visit: Payer: Self-pay | Admitting: *Deleted

## 2012-07-03 MED ORDER — ALPRAZOLAM 0.5 MG PO TBDP: 0.5000 mg | ORAL_TABLET | Freq: Two times a day (BID) | ORAL | Status: DC | PRN

## 2012-07-03 NOTE — Telephone Encounter (Signed)
Ok # 90 x 5

## 2012-07-03 NOTE — Telephone Encounter (Signed)
Received a fax from pt's pharmacy that they never received the refill for pt. Please advise.

## 2012-07-03 NOTE — Telephone Encounter (Signed)
Which Rx. Record indicates we provided 3 Rx's for her methadone which she should have had in her possession. Is the request for another medication?   If it is the methadone we need to contact the patient and check with her about this.

## 2012-07-03 NOTE — Telephone Encounter (Signed)
I apologize. Alprazolam 0.5 mg tab.

## 2012-08-10 ENCOUNTER — Telehealth: Payer: Self-pay | Admitting: *Deleted

## 2012-08-10 NOTE — Telephone Encounter (Signed)
Left msg on triage currently in [place] caring for her mom. She states md know history. Wanting him to renew methadone prescription x's 3 months, and mail to her at [address] [place] [address]...Raechel Chute

## 2012-08-11 ENCOUNTER — Other Ambulatory Visit: Payer: Self-pay | Admitting: *Deleted

## 2012-08-11 MED ORDER — METHADONE HCL 10 MG PO TABS: ORAL_TABLET | ORAL | Status: DC

## 2012-08-11 NOTE — Telephone Encounter (Signed)
[place] for refill on methadone. Reliable patient - know she is in [place] for her mom.

## 2012-08-11 NOTE — Telephone Encounter (Signed)
Pt called and left msg on triage. Pt is currently in [place] caring for her mother. She states her md knows her history. Wanting him to renew methadone prescription x's 3 months, and please mail to her at [address] [place], [place], [place] [address]. Completed and mailed today. sbc

## 2012-08-22 ENCOUNTER — Telehealth: Payer: Self-pay | Admitting: *Deleted

## 2012-08-22 NOTE — Telephone Encounter (Signed)
If she received #90 than she may take it three times a day - new Rx should not be needed.

## 2012-08-22 NOTE — Telephone Encounter (Signed)
Please call pt and let her know that quantity dispensed was #90 with 5 refills, she will be ok to take 3 times a day. Next rx we will fix the sig.

## 2012-08-22 NOTE — Telephone Encounter (Signed)
Unable to leave message on CB# given by patient . Voice mail not set up yet. Unable to leave message concerning alprazolam Rx.

## 2012-08-22 NOTE — Telephone Encounter (Signed)
PATIENT CALLED TO REQUEST A CHANGE IN Rx THAT WAS SENT IN TO PHARMACY  FOR HER ALPRAZOLAM. Rx WAS SENT IN FOR ALPRAZOLAM 2 X DAILY WITH DISP 90. PATIENT STATES SHE TAKES THIS 3 X DAY AND ALWAYS HAS. REQEUST TO CALL HER PHARMACY IN [place] WHERE SHE IS STAYING WITH HER MOM DUE TO ILLNESS TO CORRECT THIS Rx TO READ ALPRAZOLAM 3 X DAILY. CALL BACK # [phone].

## 2012-08-25 ENCOUNTER — Telehealth: Payer: Self-pay | Admitting: Internal Medicine

## 2012-08-25 NOTE — Telephone Encounter (Signed)
Patient needs to speak with someone about her Alprazolam, she was told to take it 3x daily but the script was written for 2x daily and now her pharmacy will not fill the medication again for 15 days, she needs a new script or for [place] to call the pharmacy She is currently out of town in [place] and would like a call back today at [phone]

## 2012-08-25 NOTE — Telephone Encounter (Signed)
Ok to do new script for tid dosing, #90, with refills.

## 2012-08-29 ENCOUNTER — Telehealth: Payer: Self-pay | Admitting: *Deleted

## 2012-08-29 NOTE — Telephone Encounter (Signed)
Left vm for pt to return call  

## 2012-08-31 ENCOUNTER — Telehealth: Payer: Self-pay | Admitting: Internal Medicine

## 2012-08-31 NOTE — Telephone Encounter (Signed)
Patient calling into the office for medication assistance. Called back x2 and left messages. No contact . No answer. Message  Left on voicemail to please call back for assistance if needed. Office number provided.

## 2012-08-31 NOTE — Telephone Encounter (Signed)
Patient is calling because she is in [place] and she needs a refill on her methadone, she would like it to be sent in to [facility] in [place]

## 2012-09-13 ENCOUNTER — Telehealth: Payer: Self-pay | Admitting: *Deleted

## 2012-09-13 NOTE — Telephone Encounter (Signed)
lvm for pt to return call 2 times.

## 2012-10-27 ENCOUNTER — Telehealth: Payer: Self-pay | Admitting: *Deleted

## 2012-10-27 MED ORDER — METHADONE HCL 10 MG PO TABS: ORAL_TABLET | ORAL | Status: DC

## 2012-10-27 NOTE — Telephone Encounter (Signed)
Rx printed/pending MD sig to be mailed on 10/30/12.

## 2012-10-27 NOTE — Telephone Encounter (Signed)
Ok to prepare for signature and mailing her routine methadone Rx

## 2012-10-27 NOTE — Telephone Encounter (Signed)
Pt left vm stating her mother is very ill. She is requesting her Methadone Rx for 3 months mailed to her in [place] at [address] [place], [place] [address].  Please advise.

## 2012-10-30 NOTE — Telephone Encounter (Signed)
Rx placed in outgoing mail/SLS

## 2012-12-04 ENCOUNTER — Telehealth: Payer: Self-pay

## 2012-12-04 NOTE — Telephone Encounter (Signed)
Pt called [facility] requesting a refill on methadone. Pt last seen 01/2012, will need a follow up appt Thanks

## 2012-12-04 NOTE — Telephone Encounter (Signed)
[facility] to call for an appt.

## 2012-12-18 ENCOUNTER — Telehealth: Payer: Self-pay

## 2012-12-18 NOTE — Telephone Encounter (Signed)
Needs to see Dr Ronnald Ramp as scheduled to get her 30 day rx supply -  I would ALSO advise follow up with [facility] in June for annual visit as available  I will forward to Dr Ronnald Ramp to see if he feels differently  -  thanks

## 2012-12-18 NOTE — Telephone Encounter (Signed)
Phone call from pt stating she has an appt Friday May 23 at 9:15am with Dr Yetta Barre regarding her Methadone prescription (she will be out around the 25th of May). She really wants to see Dr Debby Bud but his 1st opening is 01/15/13. Her question is if she waits till June to see Dr Debby Bud can she get a month supply of her Methadone? Please advise.

## 2012-12-19 NOTE — Telephone Encounter (Signed)
Phone call to pt, LM stating to keep her appt with Dr Yetta Barre as well as f/u with Dr Debby Bud when he is available in June. I let her know we are checking with Dr Yetta Barre to see if he agrees but for now keep her appt and call back with any questions or concerns.

## 2012-12-29 ENCOUNTER — Ambulatory Visit: Payer: Self-pay | Admitting: Internal Medicine

## 2013-01-03 ENCOUNTER — Telehealth: Payer: Self-pay | Admitting: *Deleted

## 2013-01-03 ENCOUNTER — Ambulatory Visit: Payer: Self-pay | Admitting: Internal Medicine

## 2013-01-03 NOTE — Telephone Encounter (Signed)
[facility] for methadone Rx x 3 dated per medication record.

## 2013-01-03 NOTE — Telephone Encounter (Signed)
Pt cancelled the Dr. Ronnald Ramp appt and made one for June 25 with Dr. Linda Hedges.  She wants to come by on Friday to pick up the RX for Methodone before driving back to [place]. Phone will be [phone] for now.

## 2013-01-03 NOTE — Telephone Encounter (Signed)
Pt is requesting refill of Methadone rx. She will be out in 2 days and will need to go out of town because son was in MVA this morning and is being transferred to [place]. She has appointment scheduled with Dr. Yetta Barre on 01/05/2013 but wants to see Dr. Debby Bud or have Dr. Debby Bud fill her rx until she can make an appointment to see him. Methadone rx last written 10/27/2012 #450 with 0 refills-please advise.

## 2013-01-04 ENCOUNTER — Ambulatory Visit: Payer: Self-pay | Admitting: Internal Medicine

## 2013-01-04 ENCOUNTER — Telehealth: Payer: Self-pay | Admitting: *Deleted

## 2013-01-04 MED ORDER — METHADONE HCL 10 MG PO TABS: ORAL_TABLET | ORAL | Status: DC

## 2013-01-04 NOTE — Telephone Encounter (Signed)
Prescriptions have been signed and placed at the front desk

## 2013-01-04 NOTE — Telephone Encounter (Signed)
Left msg on triage call yesterday wanting to check status of methodone rx. Called pt back spoke with mom inform her per chart rx's are ready for pick-up...lmb

## 2013-01-04 NOTE — Telephone Encounter (Signed)
Methadone prescriptions printed, waiting for Dr Debby Bud signature

## 2013-01-05 ENCOUNTER — Ambulatory Visit: Payer: Self-pay | Admitting: Internal Medicine

## 2013-01-26 ENCOUNTER — Other Ambulatory Visit: Payer: Self-pay

## 2013-01-29 MED ORDER — ALPRAZOLAM 0.5 MG PO TBDP: 0.5000 mg | ORAL_TABLET | Freq: Two times a day (BID) | ORAL | Status: DC | PRN

## 2013-01-29 MED ORDER — ALPRAZOLAM 0.5 MG PO TABS: 0.5000 mg | ORAL_TABLET | Freq: Two times a day (BID) | ORAL | Status: DC | PRN

## 2013-01-29 MED ORDER — SERTRALINE HCL 100 MG PO TABS: ORAL_TABLET | ORAL | Status: DC

## 2013-01-29 NOTE — Addendum Note (Signed)
Addended by: Lyanne Co R on: 01/29/2013 10:13 AM   Modules accepted: Orders, Medications

## 2013-01-29 NOTE — Telephone Encounter (Signed)
Spoke to [facility]) and gave him the okay for refill of Alprazolam

## 2013-01-31 ENCOUNTER — Ambulatory Visit: Payer: Self-pay | Admitting: Internal Medicine

## 2013-02-20 ENCOUNTER — Ambulatory Visit: Payer: Self-pay | Admitting: Internal Medicine

## 2013-02-20 DIAGNOSIS — Z0289 Encounter for other administrative examinations: Secondary | ICD-10-CM

## 2013-04-02 ENCOUNTER — Ambulatory Visit: Payer: Self-pay | Admitting: Internal Medicine

## 2013-04-02 ENCOUNTER — Telehealth: Payer: Self-pay | Admitting: Internal Medicine

## 2013-04-02 MED ORDER — LISINOPRIL 20 MG PO TABS: 20.0000 mg | ORAL_TABLET | Freq: Every day | ORAL | Status: DC

## 2013-04-02 MED ORDER — METHADONE HCL 10 MG PO TABS: ORAL_TABLET | ORAL | Status: DC

## 2013-04-02 NOTE — Telephone Encounter (Signed)
Pt called to cancel today's appointment because she is taking her mother to the ER for chest pain.  She was last seen in June 2013.  I've made her an appointment for a physical in Nov.  She is requesting refills on her Blood Pressure medicine and Methadone.  She uses [facility].

## 2013-04-02 NOTE — Telephone Encounter (Signed)
Lisinopril refilled and routed to [facility]. Methadone script has been printed and waiting signature.

## 2013-04-02 NOTE — Telephone Encounter (Signed)
Left message script can be picked up

## 2013-04-02 NOTE — Telephone Encounter (Signed)
1. Ok to refill blood pressure medications for 6 months. 2. May have one methadone prescription - she needs an ov (not a physical) before any additional controlled substance Rxs will be issued.

## 2013-04-26 ENCOUNTER — Telehealth: Payer: Self-pay | Admitting: *Deleted

## 2013-04-26 MED ORDER — METHADONE HCL 10 MG PO TABS: ORAL_TABLET | ORAL | Status: DC

## 2013-04-26 NOTE — Telephone Encounter (Signed)
k

## 2013-04-26 NOTE — Telephone Encounter (Signed)
Pt called states she is in [place] with her ailing mother.  Pt requests a Methadone refill.  Her next OV is 11.11.2014. Please mail to [facility] [address] [place] [address] Please advise

## 2013-04-26 NOTE — Telephone Encounter (Signed)
Script has been mailed

## 2013-05-23 ENCOUNTER — Telehealth: Payer: Self-pay | Admitting: *Deleted

## 2013-05-23 NOTE — Telephone Encounter (Signed)
Pt called requesting Methadone refill. Pt has appointment scheduled on 11.11.14.  Please advise

## 2013-05-24 MED ORDER — METHADONE HCL 10 MG PO TABS: ORAL_TABLET | ORAL | Status: DC

## 2013-05-24 NOTE — Telephone Encounter (Signed)
Spoke with pt advised Rx will be ready to pick up when she arrives to [facility] tomorrow

## 2013-05-24 NOTE — Telephone Encounter (Signed)
Ok for 1 month supply to last until OV

## 2013-06-19 ENCOUNTER — Ambulatory Visit (INDEPENDENT_AMBULATORY_CARE_PROVIDER_SITE_OTHER): Payer: Self-pay | Admitting: Internal Medicine

## 2013-06-19 ENCOUNTER — Other Ambulatory Visit (HOSPITAL_COMMUNITY)
Admission: RE | Admit: 2013-06-19 | Discharge: 2013-06-19 | Disposition: A | Discharge status: Home / Self Care | Payer: Self-pay | Source: Ambulatory Visit | Attending: Internal Medicine | Admitting: Internal Medicine

## 2013-06-19 ENCOUNTER — Other Ambulatory Visit (INDEPENDENT_AMBULATORY_CARE_PROVIDER_SITE_OTHER): Payer: Self-pay

## 2013-06-19 ENCOUNTER — Encounter: Payer: Self-pay | Admitting: Internal Medicine

## 2013-06-19 VITALS — BP 190/120 | HR 107 | Temp 97.5°F | Ht 60.0 in | Wt 167.0 lb

## 2013-06-19 DIAGNOSIS — Z0000 Encounter for general adult medical examination without abnormal findings: Secondary | ICD-10-CM

## 2013-06-19 DIAGNOSIS — I10 Essential (primary) hypertension: Secondary | ICD-10-CM

## 2013-06-19 DIAGNOSIS — F341 Dysthymic disorder: Secondary | ICD-10-CM

## 2013-06-19 DIAGNOSIS — Z01419 Encounter for gynecological examination (general) (routine) without abnormal findings: Secondary | ICD-10-CM | POA: Insufficient documentation

## 2013-06-19 DIAGNOSIS — IMO0001 Reserved for inherently not codable concepts without codable children: Secondary | ICD-10-CM

## 2013-06-19 DIAGNOSIS — Z23 Encounter for immunization: Secondary | ICD-10-CM

## 2013-06-19 DIAGNOSIS — E049 Nontoxic goiter, unspecified: Secondary | ICD-10-CM

## 2013-06-19 DIAGNOSIS — Z124 Encounter for screening for malignant neoplasm of cervix: Secondary | ICD-10-CM

## 2013-06-19 DIAGNOSIS — G894 Chronic pain syndrome: Secondary | ICD-10-CM

## 2013-06-19 LAB — LIPID PANEL
Total CHOL/HDL Ratio: 5
VLDL: 78 mg/dL — ABNORMAL HIGH (ref 0.0–40.0)

## 2013-06-19 LAB — HEPATIC FUNCTION PANEL
AST: 41 U/L — ABNORMAL HIGH (ref 0–37)
Albumin: 3.7 g/dL (ref 3.5–5.2)
Bilirubin, Direct: 0 mg/dL (ref 0.0–0.3)
Total Bilirubin: 0.4 mg/dL (ref 0.3–1.2)

## 2013-06-19 LAB — COMPREHENSIVE METABOLIC PANEL
ALT: 19 U/L (ref 0–35)
Albumin: 3.7 g/dL (ref 3.5–5.2)
CO2: 29 mEq/L (ref 19–32)
Creatinine, Ser: 0.8 mg/dL (ref 0.4–1.2)
GFR: 84.83 mL/min (ref >60.00)
Glucose, Bld: 99 mg/dL (ref 70–99)
Potassium: 3.9 mEq/L (ref 3.5–5.1)
Sodium: 138 mEq/L (ref 135–145)
Total Protein: 7.3 g/dL (ref 6.0–8.3)

## 2013-06-19 LAB — TSH: TSH: 3.75 u[IU]/mL (ref 0.35–5.50)

## 2013-06-19 LAB — HEMOGLOBIN AND HEMATOCRIT, BLOOD: Hemoglobin: 9.9 g/dL — ABNORMAL LOW (ref 12.0–15.0)

## 2013-06-19 MED ORDER — METHADONE HCL 10 MG PO TABS: ORAL_TABLET | ORAL | Status: DC

## 2013-06-19 NOTE — Progress Notes (Signed)
Subjective:    Patient ID: Emily [name], female    DOB: [birth year], 47 y.o.   MRN: [id number]  HPI Emily [name] presents for  A medical wellness exam. Reviewed her chart - last PAP '09 was normal. She has not had any new medical problems and has been doing well. She has continued on her pain medication regimen without problems. She has been in [place] with her mother.   She has very poor dentition with broken teeth and serious caries. She does have an appointment at [facility].  Past Medical History  Diagnosis Date  . Fibromyalgia   . Goiter, unspecified   . Urinary tract infection, site not specified   . Spinal stenosis, unspecified region other than cervical   . Allergic rhinitis, cause unspecified   . Unspecified essential hypertension   . Lumbago   . Chronic pain syndrome    Past Surgical History  Procedure Laterality Date  . Cesarean section      X 2  . Urethral dilation  47 yrs old  . Craniectomy    . Laminectomy    . Appendectomy  9-09    Laproscopic- Dr Marlou Starks   Family History  Problem Relation Age of Onset  . Hyperlipidemia Mother   . Hypertension Mother   . Hyperlipidemia Father   . Hypertension Father   . Coronary artery disease Father   . Heart attack Father   . Cancer Neg Hx     colon, breast  . Diabetes Neg Hx    History   Social History  . Marital Status: Divorced    Spouse Name: N/A    Number of Children: N/A  . Years of Education: N/A   Occupational History  . Not on file.   Social History Main Topics  . Smoking status: Current Every Day Smoker  . Smokeless tobacco: Not on file  . Alcohol Use: No  . Drug Use: No  . Sexual Activity: Not on file   Other Topics Concern  . Not on file   Social History Narrative  . No narrative on file    Current Outpatient Prescriptions on File Prior to Visit  Medication Sig Dispense Refill  . ALPRAZolam (XANAX) 0.5 MG tablet Take 1 tablet (0.5 mg total) by mouth 2 (two) times  daily as needed for anxiety.  90 tablet  5  . hydrochlorothiazide (HYDRODIURIL) 25 MG tablet Take 1 tablet (25 mg total) by mouth daily.  100 tablet  3  . ibuprofen (ADVIL,MOTRIN) 200 MG tablet Take 400 mg by mouth as needed.        Marland Kitchen lisinopril (PRINIVIL,ZESTRIL) 20 MG tablet Take 1 tablet (20 mg total) by mouth daily.  30 tablet  5  . loperamide (IMODIUM A-D) 2 MG tablet Take 2 mg by mouth as needed.        . methadone (DOLOPHINE) 10 MG tablet Take a total of 50 mg [5 tablets] TID.  450 tablet  0  . naproxen sodium (ANAPROX) 220 MG tablet Take 220 mg by mouth 2 (two) times daily with a meal.        . sertraline (ZOLOFT) 100 MG tablet TAKE 1 TABLET IN THE MORNING AND 1/2 TAB IN THE EVENING.  45 tablet  2   No current facility-administered medications on file prior to visit.      Review of Systems Constitutional:  Negative for fever, chills, activity change and unexpected weight change.  HEENT:  Negative for hearing loss, ear  pain, congestion, neck stiffness and postnasal drip. Negative for sore throat or swallowing problems. Very poor dentition with broken and carious teeth.   Eyes: Negative for vision loss or change in visual acuity.  Respiratory: Negative for chest tightness and wheezing. Negative for DOE.   Cardiovascular: Negative for chest pain or palpitations. No decreased exercise tolerance Gastrointestinal: No change in bowel habit. No bloating or gas. No reflux or indigestion Genitourinary: Negative for urgency, frequency, flank pain and difficulty urinating.  Musculoskeletal: Negative for myalgias, back pain, arthralgias and gait problem.  Neurological: Negative for dizziness, tremors, weakness and headaches.  Hematological: Negative for adenopathy.  Psychiatric/Behavioral: Negative for behavioral problems and dysphoric mood.       Objective:   Physical Exam Filed Vitals:   06/19/13 1323  BP: 190/120  Pulse: 107  Temp: 97.5 F (36.4 C)   Wt Readings from Last 3  Encounters:  06/19/13 167 lb (75.751 kg)  01/20/12 132 lb (59.875 kg)  10/20/11 145 lb (65.772 kg)   Gen'l: well nourished, well developed Woman in no distress HEENT - [place]/AT, EACs/TMs normal, oropharynx with native dentition in very poor condition, no buccal or palatal lesions, posterior pharynx clear, mucous membranes moist. C&S clear, PERRLA, fundi - normal Neck - supple, no thyromegaly Nodes- negative submental, cervical, supraclavicular regions Chest - no deformity, no CVAT Lungs - clear without rales, wheezes. No increased work of breathing Breast - - Skin normal, nipples w/o discharge, no fixed mass or lesion, no axillary adenopathy. Cardiovascular - regular rate and rhythm, quiet precordium, no murmurs, rubs or gallops, 2+ radial, DP and PT pulses Abdomen - BS+ x 4, no HSM, no guarding or rebound or tenderness Pelvic - NEG/BUS - nl. Vaginal mucosa normal. Scant d/c posterior fornix. Cervix retroflexed but appears normal. PAP scraping done. Bimanual exam deferred to girth. Rectal - deferred  Extremities - no clubbing, cyanosis, edema or deformity.  Neuro - A&O x 3, CN II-XII normal, motor strength normal and equal, DTRs 2+ and symmetrical biceps, radial, and patellar tendons. Cerebellar - no tremor, no rigidity, fluid movement and normal gait. Derm - Head, neck, back, abdomen and extremities without suspicious lesions  Recent Results (from the past 2160 hour(s))  LIPID PANEL     Status: Abnormal   Collection Time    06/19/13  2:41 PM      Result Value Range   Cholesterol 171  0 - 200 mg/dL   Comment: ATP III Classification       Desirable:  < 200 mg/dL               Borderline High:  200 - 239 mg/dL          High:  > = 240 mg/dL   Triglycerides 390.0 (*) 0.0 - 149.0 mg/dL   Comment: Normal:  <150 mg/dLBorderline High:  150 - 199 mg/dL   HDL 32.60 (*) >39.00 mg/dL   VLDL 78.0 (*) 0.0 - 40.0 mg/dL   Total CHOL/HDL Ratio 5     Comment:                Men          Women1/2 Average  Risk     3.4          3.3Average Risk          5.0          4.42X Average Risk          9.6  7.13X Average Risk          15.0          11.0                      TSH     Status: None   Collection Time    06/19/13  2:41 PM      Result Value Range   TSH 3.75  0.35 - 5.50 uIU/mL  HEPATIC FUNCTION PANEL     Status: Abnormal   Collection Time    06/19/13  2:41 PM      Result Value Range   Total Bilirubin 0.4  0.3 - 1.2 mg/dL   Bilirubin, Direct 0.0  0.0 - 0.3 mg/dL   Alkaline Phosphatase 93  39 - 117 U/L   AST 41 (*) 0 - 37 U/L   ALT 19  0 - 35 U/L   Total Protein 7.3  6.0 - 8.3 g/dL   Albumin 3.7  3.5 - 5.2 g/dL  COMPREHENSIVE METABOLIC PANEL     Status: Abnormal   Collection Time    06/19/13  2:41 PM      Result Value Range   Sodium 138  135 - 145 mEq/L   Potassium 3.9  3.5 - 5.1 mEq/L   Chloride 100  96 - 112 mEq/L   CO2 29  19 - 32 mEq/L   Glucose, Bld 99  70 - 99 mg/dL   BUN 6  6 - 23 mg/dL   Creatinine, Ser 0.8  0.4 - 1.2 mg/dL   Total Bilirubin 0.4  0.3 - 1.2 mg/dL   Alkaline Phosphatase 93  39 - 117 U/L   AST 41 (*) 0 - 37 U/L   ALT 19  0 - 35 U/L   Total Protein 7.3  6.0 - 8.3 g/dL   Albumin 3.7  3.5 - 5.2 g/dL   Calcium 9.3  8.4 - 10.5 mg/dL   GFR 84.83  >60.00 mL/min  HEMOGLOBIN AND HEMATOCRIT, BLOOD     Status: Abnormal   Collection Time    06/19/13  2:41 PM      Result Value Range   Hemoglobin 9.9 (*) 12.0 - 15.0 g/dL   HCT 31.1 (*) 36.0 - 46.0 %  LDL CHOLESTEROL, DIRECT     Status: None   Collection Time    06/19/13  2:41 PM      Result Value Range   Direct LDL 84.6     Comment: Optimal:  <100 mg/dLNear or Above Optimal:  100-129 mg/dLBorderline High:  130-159 mg/dLHigh:  160-189 mg/dLVery High:  >190 mg/dL         Assessment & Plan:

## 2013-06-19 NOTE — Patient Instructions (Signed)
Thank you for coming in, all the way from [place].  1. Blood pressure - YOU NEED TO TAKE YOUR BLOOD PRESSURE MEDICATION EVERY DAY!!!!! It is very high today - in the stroke zone.  Plan Routine lab today  Use MyChart to report back blood pressure readings while taking your medication and adjustments can be made in medications as needed.  2. Chronic pain - refills today. We have executed a Controlled Substance Contract today. Please be sure you understand this.  3. Health maintenance  - normal exam today except for places you scratch and your dental health.  Plan Routine lab today - results will be posted to MyChart  Keep the dental appointment at [facility]  PAP done - results to MyChart  Flu shot today.

## 2013-06-19 NOTE — Progress Notes (Signed)
Pre visit review using our clinic review tool, if applicable. No additional management support is needed unless otherwise documented below in the visit note. 

## 2013-06-20 NOTE — Assessment & Plan Note (Signed)
Controlled substance contract Nov '14. She is aware of the risks associated with chronic methadone use.  Meds refilled (see fibromyalgia entry).

## 2013-06-20 NOTE — Assessment & Plan Note (Signed)
Stable on her current medication. Controlled substance contract done. Rx refills provided: 30 day supple x 3 scripts. She is advised to stay with the same pharmacies.

## 2013-06-20 NOTE — Assessment & Plan Note (Signed)
BP Readings from Last 3 Encounters:  06/19/13 190/120  01/20/12 142/100  10/20/11 170/100   Patient is way out of control. She admits to taking her medication once or twice a week!! She is warned of the risk of stroke with uncontrolled blood pressure  Plan She is adamantly advised to take BP daily  She is asked to open a MyChart account and to relay BP readings (she lives in [place]) with recommendations to follow

## 2013-06-20 NOTE — Assessment & Plan Note (Signed)
Interval history - significant for critically bad dentition for which she will be going to [facility] for total extraction. Physical exam, with breast and pelvic exams, normal except for dentition and chronic skin lesions/excoriations. Lab results reviewed - normal including normal lipid except for elevated triglycerides. She is given a flu shot today.  In summary  a nice woman struggling with her pain issues and financial issues. She does appear to be medically stable.

## 2013-06-20 NOTE — Assessment & Plan Note (Signed)
She reports that she is doing OK. Denies SI  Plan Continue present medications

## 2013-06-22 ENCOUNTER — Encounter: Payer: Self-pay | Admitting: Internal Medicine

## 2013-08-20 ENCOUNTER — Other Ambulatory Visit: Payer: Self-pay

## 2013-08-20 MED ORDER — LISINOPRIL 20 MG PO TABS: 20.0000 mg | ORAL_TABLET | Freq: Every day | ORAL | Status: DC

## 2013-08-20 MED ORDER — ALPRAZOLAM 0.5 MG PO TABS: 0.5000 mg | ORAL_TABLET | Freq: Two times a day (BID) | ORAL | Status: DC | PRN

## 2013-09-13 ENCOUNTER — Telehealth: Payer: Self-pay | Admitting: *Deleted

## 2013-09-13 NOTE — Telephone Encounter (Signed)
Ok to prepare and mail 3 Rxs for her methadone.

## 2013-09-13 NOTE — Telephone Encounter (Signed)
Patient has phoned requesting that her methadone refill scripts BE MAILED to her in [place], where she is caring for her mother temporarily.  Last OV with PCP and last filled 06/19/13.  Please advise.   CB# [phone]

## 2013-09-14 MED ORDER — METHADONE HCL 10 MG PO TABS: ORAL_TABLET | ORAL | Status: DC

## 2013-09-14 NOTE — Telephone Encounter (Signed)
Scripts are printed, signed and on triage desk.  If patient calls back [facility], we will put in mail at that time.  Otherwise, they are on triage desk awaiting her to p/u or provide mailing address.

## 2013-09-14 NOTE — Telephone Encounter (Signed)
Scripts printed & awaiting MD sig.  Phoned patient to update script status & obtain mailing address in [place].  No answer, left message on voicemail.

## 2013-09-18 ENCOUNTER — Telehealth: Payer: Self-pay | Admitting: *Deleted

## 2013-09-18 NOTE — Telephone Encounter (Signed)
Patient phoned and stated that rather than mail scripts to [place], she will be back in [place] this Friday and will p/u in the office.  Scripts are printed, signed and in refill bin awaiting her p/u.

## 2013-12-13 ENCOUNTER — Telehealth: Payer: Self-pay | Admitting: Internal Medicine

## 2013-12-13 NOTE — Telephone Encounter (Signed)
Patient states that she needs a new rx for her methadone (DOLOPHINE) 10 MG tablet. She is driving back to [place] currently from [place] where she has been for a couple months taking care of her Mom.  She was unaware that Dr. Linda Hedges has retired until today because she has not been in town to receive her mail.  She says that she needs the rx today because anytime she has stopped taking this medication she has been hospitalized. Please advise on rx.

## 2013-12-13 NOTE — Telephone Encounter (Signed)
Spoke with pt appointment scheduled 5.11.15 at 10am

## 2013-12-17 ENCOUNTER — Ambulatory Visit (INDEPENDENT_AMBULATORY_CARE_PROVIDER_SITE_OTHER): Payer: Self-pay

## 2013-12-17 ENCOUNTER — Ambulatory Visit (INDEPENDENT_AMBULATORY_CARE_PROVIDER_SITE_OTHER): Payer: Self-pay | Admitting: Internal Medicine

## 2013-12-17 ENCOUNTER — Encounter: Payer: Self-pay | Admitting: Internal Medicine

## 2013-12-17 VITALS — BP 190/122 | HR 100 | Temp 98.5°F | Resp 16 | Ht 60.0 in | Wt 153.1 lb

## 2013-12-17 DIAGNOSIS — I10 Essential (primary) hypertension: Secondary | ICD-10-CM

## 2013-12-17 DIAGNOSIS — G894 Chronic pain syndrome: Secondary | ICD-10-CM

## 2013-12-17 DIAGNOSIS — K7689 Other specified diseases of liver: Secondary | ICD-10-CM

## 2013-12-17 DIAGNOSIS — M4802 Spinal stenosis, cervical region: Secondary | ICD-10-CM

## 2013-12-17 DIAGNOSIS — E781 Pure hyperglyceridemia: Secondary | ICD-10-CM

## 2013-12-17 DIAGNOSIS — D509 Iron deficiency anemia, unspecified: Secondary | ICD-10-CM

## 2013-12-17 DIAGNOSIS — F341 Dysthymic disorder: Secondary | ICD-10-CM

## 2013-12-17 DIAGNOSIS — Z23 Encounter for immunization: Secondary | ICD-10-CM

## 2013-12-17 DIAGNOSIS — K760 Fatty (change of) liver, not elsewhere classified: Secondary | ICD-10-CM

## 2013-12-17 DIAGNOSIS — E559 Vitamin D deficiency, unspecified: Secondary | ICD-10-CM

## 2013-12-17 DIAGNOSIS — D529 Folate deficiency anemia, unspecified: Secondary | ICD-10-CM

## 2013-12-17 DIAGNOSIS — D510 Vitamin B12 deficiency anemia due to intrinsic factor deficiency: Secondary | ICD-10-CM

## 2013-12-17 DIAGNOSIS — E876 Hypokalemia: Secondary | ICD-10-CM

## 2013-12-17 LAB — CBC WITH DIFFERENTIAL/PLATELET
BASOS PCT: 0.7 % (ref 0.0–3.0)
Basophils Absolute: 0.1 10*3/uL (ref 0.0–0.1)
EOS ABS: 0.1 10*3/uL (ref 0.0–0.7)
EOS PCT: 1.1 % (ref 0.0–5.0)
HEMATOCRIT: 33.7 % — AB (ref 36.0–46.0)
Hemoglobin: 10.8 g/dL — ABNORMAL LOW (ref 12.0–15.0)
LYMPHS ABS: 1.7 10*3/uL (ref 0.7–4.0)
Lymphocytes Relative: 21 % (ref 12.0–46.0)
MCHC: 32.1 g/dL (ref 30.0–36.0)
MCV: 71 fl — AB (ref 78.0–100.0)
MONO ABS: 0.4 10*3/uL (ref 0.1–1.0)
Monocytes Relative: 5.1 % (ref 3.0–12.0)
NEUTROS ABS: 5.9 10*3/uL (ref 1.4–7.7)
Neutrophils Relative %: 72.1 % (ref 43.0–77.0)
Platelets: 345 10*3/uL (ref 150.0–400.0)
RBC: 4.75 Mil/uL (ref 3.87–5.11)
RDW: 18.6 % — ABNORMAL HIGH (ref 11.5–15.5)
WBC: 8.2 10*3/uL (ref 4.0–10.5)

## 2013-12-17 LAB — COMPREHENSIVE METABOLIC PANEL
ALBUMIN: 3.6 g/dL (ref 3.5–5.2)
ALK PHOS: 115 U/L (ref 39–117)
ALT: 6 U/L (ref 0–35)
AST: 25 U/L (ref 0–37)
BILIRUBIN TOTAL: 0.4 mg/dL (ref 0.2–1.2)
BUN: 9 mg/dL (ref 6–23)
CO2: 29 mEq/L (ref 19–32)
Calcium: 9.7 mg/dL (ref 8.4–10.5)
Chloride: 99 mEq/L (ref 96–112)
Creatinine, Ser: 0.8 mg/dL (ref 0.4–1.2)
GFR: 85.95 mL/min (ref >60.00)
GLUCOSE: 91 mg/dL (ref 70–99)
POTASSIUM: 3.4 meq/L — AB (ref 3.5–5.1)
Sodium: 136 mEq/L (ref 135–145)
Total Protein: 8 g/dL (ref 6.0–8.3)

## 2013-12-17 LAB — URINALYSIS, ROUTINE W REFLEX MICROSCOPIC
BILIRUBIN URINE: NEGATIVE
Ketones, ur: NEGATIVE
LEUKOCYTES UA: NEGATIVE
Nitrite: NEGATIVE
Specific Gravity, Urine: <=1.005 — AB (ref 1.000–1.030)
Total Protein, Urine: NEGATIVE
UROBILINOGEN UA: 0.2 (ref 0.0–1.0)
Urine Glucose: NEGATIVE
pH: 6.5 (ref 5.0–8.0)

## 2013-12-17 LAB — LIPID PANEL
CHOL/HDL RATIO: 5
Cholesterol: 157 mg/dL (ref 0–200)
HDL: 28.8 mg/dL — ABNORMAL LOW (ref >39.00)
LDL CALC: 56 mg/dL (ref 0–99)
Triglycerides: 362 mg/dL — ABNORMAL HIGH (ref 0.0–149.0)
VLDL: 72.4 mg/dL — ABNORMAL HIGH (ref 0.0–40.0)

## 2013-12-17 LAB — FOLATE: FOLATE: 4.2 ng/mL — AB (ref >5.9)

## 2013-12-17 LAB — TSH: TSH: 2.73 u[IU]/mL (ref 0.35–4.50)

## 2013-12-17 LAB — FERRITIN: FERRITIN: 7.8 ng/mL — AB (ref 10.0–291.0)

## 2013-12-17 LAB — VITAMIN B12: VITAMIN B 12: 316 pg/mL (ref 211–911)

## 2013-12-17 LAB — IBC PANEL
IRON: 13 ug/dL — AB (ref 42–145)
Saturation Ratios: 2.9 % — ABNORMAL LOW (ref 20.0–50.0)
TRANSFERRIN: 322.4 mg/dL (ref 212.0–360.0)

## 2013-12-17 MED ORDER — FERRALET 90 90-1 MG PO TABS: 1.0000 | ORAL_TABLET | Freq: Every day | ORAL | Status: DC

## 2013-12-17 MED ORDER — ALPRAZOLAM 0.5 MG PO TABS: 0.5000 mg | ORAL_TABLET | Freq: Two times a day (BID) | ORAL | Status: DC | PRN

## 2013-12-17 MED ORDER — OLMESARTAN-AMLODIPINE-HCTZ 40-5-12.5 MG PO TABS: 1.0000 | ORAL_TABLET | Freq: Every day | ORAL | Status: DC

## 2013-12-17 MED ORDER — METHADONE HCL 10 MG PO TABS: ORAL_TABLET | ORAL | Status: DC

## 2013-12-17 NOTE — Assessment & Plan Note (Signed)
She will cont xanax as needed I will check her UDS today to screen for compliance and for substance abuse

## 2013-12-17 NOTE — Patient Instructions (Signed)

## 2013-12-17 NOTE — Addendum Note (Signed)
Addended by: Janith Lima on: 12/17/2013 02:05 PM   Modules accepted: Orders

## 2013-12-17 NOTE — Progress Notes (Signed)
Subjective:    Patient ID: Emily [name], female    DOB: [birth year], 48 y.o.   MRN: [id number]  Hypertension This is a chronic problem. The current episode started more than 1 year ago. The problem has been gradually worsening since onset. The problem is uncontrolled. Associated symptoms include anxiety and neck pain. Pertinent negatives include no blurred vision, chest pain, headaches, malaise/fatigue, orthopnea, palpitations, peripheral edema, PND, shortness of breath or sweats. Agents associated with hypertension include NSAIDs. Past treatments include ACE inhibitors and diuretics. The current treatment provides mild improvement. Compliance problems include diet and exercise.       Review of Systems  Constitutional: Negative.  Negative for fever, chills, malaise/fatigue, diaphoresis, appetite change and fatigue.  HENT: Negative.   Eyes: Negative.  Negative for blurred vision.  Respiratory: Negative.  Negative for cough, choking, chest tightness, shortness of breath and stridor.   Cardiovascular: Negative.  Negative for chest pain, palpitations, orthopnea, leg swelling and PND.  Gastrointestinal: Negative.  Negative for nausea, vomiting, abdominal pain, diarrhea, constipation and blood in stool.  Endocrine: Negative.   Genitourinary: Negative.   Musculoskeletal: Positive for neck pain. Negative for arthralgias, back pain, gait problem, joint swelling, myalgias and neck stiffness.       She has chronic severe neck pain and is s/p a cervical fusion.  Skin: Negative.   Allergic/Immunologic: Negative.   Neurological: Negative.  Negative for headaches.  Hematological: Negative.  Negative for adenopathy. Does not bruise/bleed easily.  Psychiatric/Behavioral: Negative for suicidal ideas, hallucinations, behavioral problems, confusion, sleep disturbance, self-injury, dysphoric mood, decreased concentration and agitation. The patient is nervous/anxious. The patient is not hyperactive.         Objective:   Physical Exam  Vitals reviewed. Constitutional: She is oriented to person, place, and time. She appears well-developed and well-nourished.  Non-toxic appearance. She does not have a sickly appearance. She does not appear ill. No distress.  HENT:  Head: Normocephalic and atraumatic.  Mouth/Throat: Oropharynx is clear and moist. No oropharyngeal exudate.  Eyes: Conjunctivae are normal. Right eye exhibits no discharge. Left eye exhibits no discharge. No scleral icterus.  Neck: Normal range of motion. Neck supple. No JVD present. No tracheal deviation present. No thyromegaly present.  Cardiovascular: Normal rate, regular rhythm, normal heart sounds and intact distal pulses.  Exam reveals no gallop and no friction rub.   No murmur heard. Pulmonary/Chest: Effort normal and breath sounds normal. No stridor. No respiratory distress. She has no wheezes. She has no rales. She exhibits no tenderness.  Abdominal: Soft. Bowel sounds are normal. She exhibits no distension and no mass. There is no hepatosplenomegaly, splenomegaly or hepatomegaly. There is no tenderness. There is no rebound, no guarding and no CVA tenderness. No hernia. Hernia confirmed negative in the ventral area, confirmed negative in the right inguinal area and confirmed negative in the left inguinal area.  Musculoskeletal: Normal range of motion. She exhibits no edema and no tenderness.  Lymphadenopathy:    She has no cervical adenopathy.  Neurological: She is oriented to person, place, and time.  Skin: Skin is warm and dry. No rash noted. She is not diaphoretic. No erythema. No pallor.  Psychiatric: She has a normal mood and affect. Her behavior is normal. Judgment and thought content normal.     Lab Results  Component Value Date   WBC 7.7 01/20/2011   HGB 9.9* 06/19/2013   HCT 31.1* 06/19/2013   PLT 336.0 01/20/2011   GLUCOSE 99 06/19/2013   CHOL 171  06/19/2013   TRIG 390.0* 06/19/2013   HDL 32.60*  06/19/2013   LDLDIRECT 84.6 06/19/2013   ALT 19 06/19/2013   ALT 19 06/19/2013   AST 41* 06/19/2013   AST 41* 06/19/2013   NA 138 06/19/2013   K 3.9 06/19/2013   CL 100 06/19/2013   CREATININE 0.8 06/19/2013   BUN 6 06/19/2013   CO2 29 06/19/2013   TSH 3.75 06/19/2013   INR 1.0 04/23/2008       Assessment & Plan:

## 2013-12-17 NOTE — Assessment & Plan Note (Signed)
Her BP is not well controlled. I have asked her to stop all nsaids. Will try to get better BP control with tribenzor. Today I will check her labs to look for end organ damage and secondary causes of HTN.

## 2013-12-17 NOTE — Assessment & Plan Note (Signed)
I will recheck her LFT's today and will screen her for viral hepatitis.

## 2013-12-17 NOTE — Assessment & Plan Note (Signed)
Will cont methadone for now I will check her UDS today to screen for compliance and substance abuse

## 2013-12-17 NOTE — Assessment & Plan Note (Signed)
I do not see any s/s of blood loss Will recheck her CBC today and will look at her vitamin levels as well

## 2013-12-17 NOTE — Progress Notes (Signed)
Pre visit review using our clinic review tool, if applicable. No additional management support is needed unless otherwise documented below in the visit note. 

## 2013-12-18 ENCOUNTER — Telehealth: Payer: Self-pay | Admitting: Internal Medicine

## 2013-12-18 ENCOUNTER — Encounter: Payer: Self-pay | Admitting: Internal Medicine

## 2013-12-18 DIAGNOSIS — E559 Vitamin D deficiency, unspecified: Secondary | ICD-10-CM | POA: Insufficient documentation

## 2013-12-18 DIAGNOSIS — E876 Hypokalemia: Secondary | ICD-10-CM | POA: Insufficient documentation

## 2013-12-18 LAB — DRUGS OF ABUSE SCREEN W/O ALC, ROUTINE URINE
Amphetamine Screen, Ur: NEGATIVE
Barbiturate Quant, Ur: NEGATIVE
Benzodiazepines.: POSITIVE — AB
Cocaine Metabolites: NEGATIVE
Creatinine,U: 37.1 mg/dL
MARIJUANA METABOLITE: NEGATIVE
Methadone: POSITIVE — AB
OPIATE SCREEN, URINE: NEGATIVE
PROPOXYPHENE: NEGATIVE
Phencyclidine (PCP): NEGATIVE

## 2013-12-18 LAB — HEPATITIS B CORE ANTIBODY, TOTAL: HEP B C TOTAL AB: NONREACTIVE

## 2013-12-18 LAB — VITAMIN D 25 HYDROXY (VIT D DEFICIENCY, FRACTURES): Vit D, 25-Hydroxy: 11 ng/mL — ABNORMAL LOW (ref 30–89)

## 2013-12-18 LAB — HEPATITIS C ANTIBODY: HCV AB: NEGATIVE

## 2013-12-18 LAB — HEPATITIS A ANTIBODY, TOTAL: Hep A Total Ab: NONREACTIVE

## 2013-12-18 LAB — HEPATITIS B SURFACE ANTIBODY,QUALITATIVE: HEP B S AB: NEGATIVE

## 2013-12-18 MED ORDER — POTASSIUM CHLORIDE CRYS ER 20 MEQ PO TBCR: 20.0000 meq | EXTENDED_RELEASE_TABLET | Freq: Two times a day (BID) | ORAL | Status: DC

## 2013-12-18 MED ORDER — CHOLECALCIFEROL 1.25 MG (50000 UT) PO TABS: 1.0000 | ORAL_TABLET | ORAL | Status: DC

## 2013-12-18 NOTE — Addendum Note (Signed)
Addended by: Etta GrandchildJONES, Jerral Mccauley L on: 12/18/2013 07:10 AM   Modules accepted: Orders

## 2013-12-18 NOTE — Telephone Encounter (Signed)
Relevant patient education assigned to patient using Emmi. ° °

## 2013-12-21 LAB — BENZODIAZEPINES (GC/LC/MS), URINE
ALPRAZOLAMU: 103 ng/mL — AB (ref <25)
CLONAZEPAU: NEGATIVE ng/mL (ref <25)
FLURAZEPAMU: NEGATIVE ng/mL (ref <50)
Lorazepam (GC/LC/MS), ur confirm: NEGATIVE ng/mL (ref <50)
MIDAZOLAMU: NEGATIVE ng/mL (ref <50)
Nordiazepam (GC/LC/MS), ur confirm: NEGATIVE ng/mL (ref <50)
OXAZEPAMU: NEGATIVE ng/mL (ref <50)
Temazepam (GC/LC/MS), ur confirm: NEGATIVE ng/mL (ref <50)
Triazolam metabolite (GC/LC/MS), ur confirm: NEGATIVE ng/mL (ref <50)

## 2013-12-21 LAB — METHADONE (GC/LC/MS), URINE
EDDP (GC/LC/MS), ur confirm: 6381 ng/mL — ABNORMAL HIGH (ref <100)
Methadone (GC/LC/MS), ur confirm: 4807 ng/mL — ABNORMAL HIGH (ref <100)

## 2014-01-14 ENCOUNTER — Other Ambulatory Visit: Payer: Self-pay

## 2014-01-14 ENCOUNTER — Encounter: Payer: Self-pay | Admitting: Internal Medicine

## 2014-01-14 ENCOUNTER — Ambulatory Visit (INDEPENDENT_AMBULATORY_CARE_PROVIDER_SITE_OTHER): Payer: Self-pay | Admitting: Internal Medicine

## 2014-01-14 VITALS — BP 140/92 | HR 80 | Temp 98.0°F | Resp 16 | Ht 60.0 in | Wt 151.0 lb

## 2014-01-14 DIAGNOSIS — D509 Iron deficiency anemia, unspecified: Secondary | ICD-10-CM

## 2014-01-14 DIAGNOSIS — I10 Essential (primary) hypertension: Secondary | ICD-10-CM

## 2014-01-14 DIAGNOSIS — E559 Vitamin D deficiency, unspecified: Secondary | ICD-10-CM

## 2014-01-14 DIAGNOSIS — M4802 Spinal stenosis, cervical region: Secondary | ICD-10-CM

## 2014-01-14 DIAGNOSIS — D529 Folate deficiency anemia, unspecified: Secondary | ICD-10-CM

## 2014-01-14 DIAGNOSIS — G894 Chronic pain syndrome: Secondary | ICD-10-CM

## 2014-01-14 MED ORDER — METHADONE HCL 10 MG PO TABS: ORAL_TABLET | ORAL | Status: DC

## 2014-01-14 MED ORDER — CHOLECALCIFEROL 1.25 MG (50000 UT) PO TABS: 1.0000 | ORAL_TABLET | ORAL | Status: DC

## 2014-01-14 NOTE — Assessment & Plan Note (Signed)
She will start a vit d supplement

## 2014-01-14 NOTE — Assessment & Plan Note (Signed)
Cont ferralet Will screen for celiac disease today

## 2014-01-14 NOTE — Assessment & Plan Note (Signed)
Her BP is adequately well controlled 

## 2014-01-14 NOTE — Patient Instructions (Signed)
Iron Deficiency Anemia, Adult  Anemia is a condition in which there are less red blood cells or hemoglobin in the blood than normal. Hemoglobin is this part of red blood cells that carries oxygen. Iron deficiency anemia is anemia caused by too little iron. It is the most common type of anemia. It may leave you tired and short of breath.  CAUSES    Lack of iron in the diet.   Poor absorption of iron, as seen with intestinal disorders.   Intestinal bleeding.   Heavy periods.  SIGNS AND SYMPTOMS   Mild anemia may not be noticeable. Symptoms may include:   Fatigue.   Headache.   Pale skin.   Weakness.   Tiredness.   Shortness of breath.   Dizziness.   Cold hands and feet.   Fast or irregular heartbeat.  DIAGNOSIS   Diagnosis requires a thorough evaluation and physical exam by your health care provider. Blood tests are generally used to confirm iron deficiency anemia. Additional tests may be done to find the underlying cause of your anemia. These may include:   Testing for blood in the stool (fecal occult blood test).   A procedure to see inside the colon and rectum (colonoscopy).   A procedure to see inside the esophagus and stomach (endoscopy).  TREATMENT   Iron deficiency anemia is treated by correcting the cause of the deficiency. Treatment may involve:   Adding iron-rich foods to your diet.   Taking iron supplements. Pregnant or breastfeeding women need to take extra iron, because their normal diet usually does not provide the required amount.   Taking vitamins. Vitamin C improves the absorption of iron. Your health care provider may recommend taking your iron tablets with a glass of orange juice or vitamin C supplement.   Medicines to make heavy menstrual flow lighter.   Surgery.  HOME CARE INSTRUCTIONS    Take iron as directed by your health care provider.   If you cannot tolerate taking iron supplements by mouth, talk to your health care provider about taking them through a vein  (intravenously) or an injection into a muscle.   For the best iron absorption, iron supplements should be taken on an empty stomach. If you cannot tolerate them on an empty stomach, you may need to take them with food.   Do not drink milk or take antacids at the same time as your iron supplements. Milk and antacids may interfere with the absorption of iron.   Iron supplements can cause constipation. Make sure to include fiber in your diet to prevent constipation. A stool softener may also be recommended.   Take vitamins as directed by your health care provider.   Eat a diet rich in iron. Foods high in iron include liver, lean beef, whole-grain bread, eggs, dried fruit, and dark green, leafy vegetables.  SEEK IMMEDIATE MEDICAL CARE IF:    You faint. If this happens, do not drive. Call your local emergency services (911 in [place] if no other help is available.   You have chest pain.   You feel nauseous or vomit.   You have severe or increased shortness of breath with activity.   You feel weak.   You have a rapid heartbeat.   You have unexplained sweating.   You become lightheaded when getting up from a chair or bed.  MAKE SURE YOU:    Understand these instructions.   Will watch your condition.   Will get help right away if you are not   doing well or get worse.  Document Released: 07/23/2000 Document Revised: 05/16/2013 Document Reviewed: 04/02/2013  [employer] Patient Information 2014 [employer]

## 2014-01-14 NOTE — Progress Notes (Signed)
Pre visit review using our clinic review tool, if applicable. No additional management support is needed unless otherwise documented below in the visit note. 

## 2014-01-14 NOTE — Progress Notes (Signed)
   Subjective:    Patient ID: Emily [name], female    DOB: [birth year], 48 y.o.   MRN: [id number]  Anemia Presents for follow-up visit. Symptoms include malaise/fatigue. There has been no abdominal pain, anorexia, bruising/bleeding easily, confusion, fever, leg swelling, light-headedness, pallor, palpitations, paresthesias, pica or weight loss. Signs of blood loss that are not present include hematemesis, hematochezia, melena, menorrhagia and vaginal bleeding. Past treatments include oral iron supplements. Past medical history includes malnutrition. There are no compliance problems.       Review of Systems  Constitutional: Positive for malaise/fatigue. Negative for fever, chills, weight loss, diaphoresis, appetite change and fatigue.  HENT: Negative.   Eyes: Negative.   Respiratory: Negative.  Negative for cough, choking, chest tightness, shortness of breath and stridor.   Cardiovascular: Negative.  Negative for chest pain, palpitations and leg swelling.  Gastrointestinal: Positive for diarrhea (she occasionally has diarrhea). Negative for nausea, vomiting, abdominal pain, constipation, blood in stool, melena, hematochezia, anorexia and hematemesis.  Endocrine: Negative.   Genitourinary: Negative.  Negative for vaginal bleeding and menorrhagia.  Musculoskeletal: Positive for neck pain. Negative for arthralgias, back pain, gait problem, joint swelling, myalgias and neck stiffness.  Skin: Negative.  Negative for pallor and rash.  Allergic/Immunologic: Negative.   Neurological: Negative.  Negative for dizziness, tremors, facial asymmetry, speech difficulty, weakness, light-headedness and paresthesias.  Hematological: Negative.  Negative for adenopathy. Does not bruise/bleed easily.  Psychiatric/Behavioral: Negative.  Negative for confusion.       Objective:   Physical Exam  Vitals reviewed. Constitutional: She is oriented to person, place, and time. She appears well-developed and  well-nourished. No distress.  HENT:  Head: Normocephalic and atraumatic.  Mouth/Throat: Oropharynx is clear and moist. No oropharyngeal exudate.  Eyes: Conjunctivae are normal. Right eye exhibits no discharge. Left eye exhibits no discharge. No scleral icterus.  Neck: Normal range of motion. Neck supple. No JVD present. No tracheal deviation present. No thyromegaly present.  Cardiovascular: Normal rate, regular rhythm, normal heart sounds and intact distal pulses.  Exam reveals no gallop and no friction rub.   No murmur heard. Pulmonary/Chest: Effort normal and breath sounds normal. No stridor. No respiratory distress. She has no wheezes. She has no rales. She exhibits no tenderness.  Abdominal: Soft. Bowel sounds are normal. She exhibits no distension and no mass. There is no tenderness. There is no rebound and no guarding.  Musculoskeletal: Normal range of motion. She exhibits no edema and no tenderness.  Lymphadenopathy:    She has no cervical adenopathy.  Neurological: She is oriented to person, place, and time.  Skin: Skin is warm and dry. No rash noted. She is not diaphoretic. No erythema. No pallor.      Lab Results  Component Value Date   WBC 8.2 12/17/2013   HGB 10.8* 12/17/2013   HCT 33.7* 12/17/2013   PLT 345.0 12/17/2013   GLUCOSE 91 12/17/2013   CHOL 157 12/17/2013   TRIG 362.0* 12/17/2013   HDL 28.80* 12/17/2013   LDLDIRECT 84.6 06/19/2013   LDLCALC 56 12/17/2013   ALT 6 12/17/2013   AST 25 12/17/2013   NA 136 12/17/2013   K 3.4* 12/17/2013   CL 99 12/17/2013   CREATININE 0.8 12/17/2013   BUN 9 12/17/2013   CO2 29 12/17/2013   TSH 2.73 12/17/2013   INR 1.0 04/23/2008      Assessment & Plan:

## 2014-01-14 NOTE — Assessment & Plan Note (Signed)
Cont ferralet I will screen her for celiac disease today

## 2014-01-15 LAB — RETICULIN ANTIBODIES, IGA W TITER: RETICULIN AB, IGA: NEGATIVE

## 2014-01-21 ENCOUNTER — Encounter: Payer: Self-pay | Admitting: Internal Medicine

## 2014-01-24 LAB — TISSUE TRANSGLUTAMINASE, IGA: Tissue Transglutaminase Ab, IgA: 7 U/mL (ref <20)

## 2014-01-24 LAB — GLIADIN ANTIBODIES, SERUM
Gliadin IgA: 24 U/mL — ABNORMAL HIGH (ref <20)
Gliadin IgG: 11.7 U/mL (ref <20)

## 2014-01-28 ENCOUNTER — Encounter: Payer: Self-pay | Admitting: Internal Medicine

## 2014-04-16 ENCOUNTER — Encounter: Payer: Self-pay | Admitting: Internal Medicine

## 2014-04-16 ENCOUNTER — Ambulatory Visit (INDEPENDENT_AMBULATORY_CARE_PROVIDER_SITE_OTHER): Payer: Self-pay | Admitting: Internal Medicine

## 2014-04-16 ENCOUNTER — Ambulatory Visit (INDEPENDENT_AMBULATORY_CARE_PROVIDER_SITE_OTHER): Payer: Self-pay

## 2014-04-16 VITALS — BP 190/120 | HR 101 | Temp 98.0°F | Resp 16 | Ht 61.0 in | Wt 144.0 lb

## 2014-04-16 DIAGNOSIS — G894 Chronic pain syndrome: Secondary | ICD-10-CM

## 2014-04-16 DIAGNOSIS — M4802 Spinal stenosis, cervical region: Secondary | ICD-10-CM

## 2014-04-16 DIAGNOSIS — IMO0001 Reserved for inherently not codable concepts without codable children: Secondary | ICD-10-CM

## 2014-04-16 DIAGNOSIS — D509 Iron deficiency anemia, unspecified: Secondary | ICD-10-CM

## 2014-04-16 DIAGNOSIS — D529 Folate deficiency anemia, unspecified: Secondary | ICD-10-CM

## 2014-04-16 DIAGNOSIS — I10 Essential (primary) hypertension: Secondary | ICD-10-CM

## 2014-04-16 DIAGNOSIS — E876 Hypokalemia: Secondary | ICD-10-CM

## 2014-04-16 DIAGNOSIS — Z23 Encounter for immunization: Secondary | ICD-10-CM

## 2014-04-16 LAB — URINALYSIS, ROUTINE W REFLEX MICROSCOPIC
Bilirubin Urine: NEGATIVE
KETONES UR: NEGATIVE
LEUKOCYTES UA: NEGATIVE
Nitrite: NEGATIVE
Total Protein, Urine: NEGATIVE
URINE GLUCOSE: NEGATIVE
UROBILINOGEN UA: 0.2 (ref 0.0–1.0)
pH: 6 (ref 5.0–8.0)

## 2014-04-16 LAB — BASIC METABOLIC PANEL
BUN: 4 mg/dL — ABNORMAL LOW (ref 6–23)
CALCIUM: 9.5 mg/dL (ref 8.4–10.5)
CO2: 32 meq/L (ref 19–32)
CREATININE: 0.9 mg/dL (ref 0.4–1.2)
Chloride: 99 mEq/L (ref 96–112)
GFR: 74.42 mL/min (ref >60.00)
Glucose, Bld: 92 mg/dL (ref 70–99)
Potassium: 3.8 mEq/L (ref 3.5–5.1)
SODIUM: 138 meq/L (ref 135–145)

## 2014-04-16 LAB — CBC WITH DIFFERENTIAL/PLATELET
BASOS ABS: 0 10*3/uL (ref 0.0–0.1)
Basophils Relative: 0.5 % (ref 0.0–3.0)
Eosinophils Absolute: 0.1 10*3/uL (ref 0.0–0.7)
Eosinophils Relative: 1.3 % (ref 0.0–5.0)
HEMATOCRIT: 34.7 % — AB (ref 36.0–46.0)
Hemoglobin: 11.3 g/dL — ABNORMAL LOW (ref 12.0–15.0)
LYMPHS ABS: 2 10*3/uL (ref 0.7–4.0)
Lymphocytes Relative: 22.2 % (ref 12.0–46.0)
MCHC: 32.5 g/dL (ref 30.0–36.0)
MCV: 75.9 fl — ABNORMAL LOW (ref 78.0–100.0)
MONO ABS: 0.4 10*3/uL (ref 0.1–1.0)
MONOS PCT: 4.8 % (ref 3.0–12.0)
NEUTROS ABS: 6.5 10*3/uL (ref 1.4–7.7)
Neutrophils Relative %: 71.2 % (ref 43.0–77.0)
Platelets: 341 10*3/uL (ref 150.0–400.0)
RBC: 4.57 Mil/uL (ref 3.87–5.11)
RDW: 17.2 % — AB (ref 11.5–15.5)
WBC: 9.2 10*3/uL (ref 4.0–10.5)

## 2014-04-16 MED ORDER — METHADONE HCL 10 MG PO TABS: ORAL_TABLET | ORAL | Status: DC

## 2014-04-16 MED ORDER — OLMESARTAN-AMLODIPINE-HCTZ 40-10-25 MG PO TABS: 1.0000 | ORAL_TABLET | Freq: Every day | ORAL | Status: DC

## 2014-04-16 NOTE — Assessment & Plan Note (Signed)
I will recheck her UDS today 

## 2014-04-16 NOTE — Assessment & Plan Note (Signed)
Will cont methadone as needed for pain 

## 2014-04-16 NOTE — Patient Instructions (Signed)

## 2014-04-16 NOTE — Progress Notes (Signed)
Subjective:    Patient ID: Emily [name], female    DOB: [birth year], 48 y.o.   MRN: [id number]  Hypertension This is a chronic problem. The current episode started more than 1 year ago. The problem has been gradually worsening since onset. The problem is uncontrolled. Associated symptoms include anxiety and neck pain. Pertinent negatives include no blurred vision, chest pain, headaches, malaise/fatigue, orthopnea, palpitations, peripheral edema, PND, shortness of breath or sweats. Risk factors for coronary artery disease include smoking/tobacco exposure. Treatments tried: she has not taken any meds for several weeks. The current treatment provides no improvement. Compliance problems include psychosocial issues, medication cost, exercise and diet.       Review of Systems  Constitutional: Negative.  Negative for fever, chills, malaise/fatigue, diaphoresis, activity change, appetite change, fatigue and unexpected weight change.  HENT: Negative.   Eyes: Negative.  Negative for blurred vision.  Respiratory: Negative.  Negative for cough, choking, chest tightness, shortness of breath and stridor.   Cardiovascular: Negative.  Negative for chest pain, palpitations, orthopnea, leg swelling and PND.  Gastrointestinal: Negative.  Negative for nausea, vomiting, abdominal pain, diarrhea, constipation and blood in stool.  Endocrine: Negative.  Negative for polydipsia, polyphagia and polyuria.  Genitourinary: Negative.   Musculoskeletal: Positive for myalgias and neck pain. Negative for arthralgias, back pain, gait problem, joint swelling and neck stiffness.  Skin: Negative.  Negative for rash.  Allergic/Immunologic: Negative.   Neurological: Negative.  Negative for headaches.  Hematological: Negative.  Negative for adenopathy. Does not bruise/bleed easily.  Psychiatric/Behavioral: Negative for suicidal ideas, hallucinations, behavioral problems, confusion, sleep disturbance, self-injury, dysphoric  mood, decreased concentration and agitation. The patient is nervous/anxious. The patient is not hyperactive.        Objective:   Physical Exam  Vitals reviewed. Constitutional: She is oriented to person, place, and time. She appears well-developed and well-nourished. No distress.  HENT:  Head: Normocephalic and atraumatic.  Mouth/Throat: Oropharynx is clear and moist. No oropharyngeal exudate.  Eyes: Conjunctivae are normal. Right eye exhibits no discharge. Left eye exhibits no discharge. No scleral icterus.  Neck: Normal range of motion. Neck supple. No JVD present. No tracheal deviation present. No thyromegaly present.  Cardiovascular: Normal rate, regular rhythm, normal heart sounds and intact distal pulses.  Exam reveals no gallop and no friction rub.   No murmur heard. Pulmonary/Chest: Effort normal and breath sounds normal. No stridor. No respiratory distress. She has no wheezes. She has no rales. She exhibits no tenderness.  Abdominal: Soft. Bowel sounds are normal. She exhibits no distension and no mass. There is no tenderness. There is no rebound and no guarding.  Musculoskeletal: Normal range of motion. She exhibits no edema and no tenderness.  Lymphadenopathy:    She has no cervical adenopathy.  Neurological: She is oriented to person, place, and time.  Skin: Skin is warm and dry. No rash noted. She is not diaphoretic. No erythema. No pallor.  Psychiatric: She has a normal mood and affect. Her behavior is normal. Judgment and thought content normal.     Lab Results  Component Value Date   WBC 8.2 12/17/2013   HGB 10.8* 12/17/2013   HCT 33.7* 12/17/2013   PLT 345.0 12/17/2013   GLUCOSE 91 12/17/2013   CHOL 157 12/17/2013   TRIG 362.0* 12/17/2013   HDL 28.80* 12/17/2013   LDLDIRECT 84.6 06/19/2013   LDLCALC 56 12/17/2013   ALT 6 12/17/2013   AST 25 12/17/2013   NA 136 12/17/2013   K 3.4* 12/17/2013  CL 99 12/17/2013   CREATININE 0.8 12/17/2013   BUN 9 12/17/2013   CO2 29  12/17/2013   TSH 2.73 12/17/2013   INR 1.0 04/23/2008        Assessment & Plan:

## 2014-04-16 NOTE — Progress Notes (Signed)
Pre visit review using our clinic review tool, if applicable. No additional management support is needed unless otherwise documented below in the visit note. 

## 2014-04-16 NOTE — Assessment & Plan Note (Signed)
Her BP is not well controlled as she has not been taking tribenzor I gave her samples and asked her to restart it She was also asked to quit smoking and to work on her lifestyle modifications I will monitor her lytes and renal function today

## 2014-04-16 NOTE — Assessment & Plan Note (Signed)
I will recheck her K+ level today 

## 2014-04-16 NOTE — Assessment & Plan Note (Signed)
Will recheck her CBC today 

## 2014-04-17 LAB — DRUGS OF ABUSE SCREEN W/O ALC, ROUTINE URINE
Amphetamine Screen, Ur: POSITIVE — AB
Barbiturate Quant, Ur: NEGATIVE
Benzodiazepines.: NEGATIVE
COCAINE METABOLITES: NEGATIVE
CREATININE, U: 19.9 mg/dL
MARIJUANA METABOLITE: NEGATIVE
Methadone: POSITIVE — AB
OPIATE SCREEN, URINE: NEGATIVE
Phencyclidine (PCP): NEGATIVE
Propoxyphene: NEGATIVE

## 2014-04-20 ENCOUNTER — Encounter: Payer: Self-pay | Admitting: Internal Medicine

## 2014-04-20 LAB — AMPHETAMINES (GC/LC/MS), URINE
AMPHETAMINE CONF, UR: 934 ng/mL — AB (ref <250)
Methamphetamine Quant, Ur: NEGATIVE ng/mL (ref <250)

## 2014-04-20 LAB — METHADONE (GC/LC/MS), URINE
EDDP (GC/LC/MS), ur confirm: 4345 ng/mL — ABNORMAL HIGH (ref <100)
Methadone (GC/LC/MS), ur confirm: 6855 ng/mL — ABNORMAL HIGH (ref <100)

## 2014-04-20 NOTE — Addendum Note (Signed)
Addended by: Etta Grandchild on: 04/20/2014 09:27 AM   Modules accepted: Orders, Medications

## 2014-05-06 ENCOUNTER — Ambulatory Visit: Payer: Self-pay | Admitting: Internal Medicine

## 2014-05-15 ENCOUNTER — Ambulatory Visit: Payer: Self-pay | Admitting: Internal Medicine

## 2014-05-23 ENCOUNTER — Ambulatory Visit: Payer: Self-pay | Admitting: Internal Medicine

## 2014-05-31 ENCOUNTER — Ambulatory Visit: Payer: Self-pay | Admitting: Internal Medicine

## 2014-06-17 ENCOUNTER — Ambulatory Visit: Payer: Self-pay | Admitting: Internal Medicine

## 2014-06-28 ENCOUNTER — Ambulatory Visit: Payer: Self-pay | Admitting: Internal Medicine

## 2014-07-10 ENCOUNTER — Ambulatory Visit: Payer: Self-pay | Admitting: Internal Medicine

## 2014-07-11 ENCOUNTER — Ambulatory Visit: Payer: Self-pay | Admitting: Internal Medicine

## 2014-07-24 ENCOUNTER — Ambulatory Visit (INDEPENDENT_AMBULATORY_CARE_PROVIDER_SITE_OTHER): Payer: Self-pay | Admitting: Internal Medicine

## 2014-07-24 ENCOUNTER — Encounter: Payer: Self-pay | Admitting: Internal Medicine

## 2014-07-24 ENCOUNTER — Other Ambulatory Visit (INDEPENDENT_AMBULATORY_CARE_PROVIDER_SITE_OTHER): Payer: Self-pay

## 2014-07-24 VITALS — BP 140/84 | HR 97 | Temp 98.2°F | Resp 16 | Ht 60.0 in | Wt 144.0 lb

## 2014-07-24 DIAGNOSIS — I10 Essential (primary) hypertension: Secondary | ICD-10-CM

## 2014-07-24 DIAGNOSIS — G894 Chronic pain syndrome: Secondary | ICD-10-CM

## 2014-07-24 DIAGNOSIS — D529 Folate deficiency anemia, unspecified: Secondary | ICD-10-CM

## 2014-07-24 DIAGNOSIS — K760 Fatty (change of) liver, not elsewhere classified: Secondary | ICD-10-CM

## 2014-07-24 DIAGNOSIS — E876 Hypokalemia: Secondary | ICD-10-CM

## 2014-07-24 DIAGNOSIS — M4802 Spinal stenosis, cervical region: Secondary | ICD-10-CM

## 2014-07-24 DIAGNOSIS — D509 Iron deficiency anemia, unspecified: Secondary | ICD-10-CM

## 2014-07-24 LAB — CBC WITH DIFFERENTIAL/PLATELET
BASOS PCT: 1.1 % (ref 0.0–3.0)
Basophils Absolute: 0.1 10*3/uL (ref 0.0–0.1)
EOS PCT: 2.3 % (ref 0.0–5.0)
Eosinophils Absolute: 0.3 10*3/uL (ref 0.0–0.7)
HCT: 35.7 % — ABNORMAL LOW (ref 36.0–46.0)
Hemoglobin: 11.4 g/dL — ABNORMAL LOW (ref 12.0–15.0)
LYMPHS PCT: 27.1 % (ref 12.0–46.0)
Lymphs Abs: 3.2 10*3/uL (ref 0.7–4.0)
MCHC: 32 g/dL (ref 30.0–36.0)
MCV: 75 fl — AB (ref 78.0–100.0)
MONO ABS: 1.1 10*3/uL — AB (ref 0.1–1.0)
MONOS PCT: 9.2 % (ref 3.0–12.0)
NEUTROS PCT: 60.3 % (ref 43.0–77.0)
Neutro Abs: 7.2 10*3/uL (ref 1.4–7.7)
Platelets: 360 10*3/uL (ref 150.0–400.0)
RBC: 4.76 Mil/uL (ref 3.87–5.11)
RDW: 17.5 % — ABNORMAL HIGH (ref 11.5–15.5)
WBC: 11.9 10*3/uL — AB (ref 4.0–10.5)

## 2014-07-24 LAB — COMPREHENSIVE METABOLIC PANEL
ALBUMIN: 4.1 g/dL (ref 3.5–5.2)
ALK PHOS: 81 U/L (ref 39–117)
ALT: 6 U/L (ref 0–35)
AST: 17 U/L (ref 0–37)
BUN: 12 mg/dL (ref 6–23)
CO2: 28 meq/L (ref 19–32)
Calcium: 9.8 mg/dL (ref 8.4–10.5)
Chloride: 98 mEq/L (ref 96–112)
Creatinine, Ser: 1.1 mg/dL (ref 0.4–1.2)
GFR: 57.15 mL/min — AB (ref >60.00)
GLUCOSE: 107 mg/dL — AB (ref 70–99)
POTASSIUM: 3.9 meq/L (ref 3.5–5.1)
Sodium: 137 mEq/L (ref 135–145)
Total Bilirubin: 0.3 mg/dL (ref 0.2–1.2)
Total Protein: 7.7 g/dL (ref 6.0–8.3)

## 2014-07-24 LAB — IBC PANEL
IRON: 30 ug/dL — AB (ref 42–145)
SATURATION RATIOS: 6.2 % — AB (ref 20.0–50.0)
TRANSFERRIN: 344.7 mg/dL (ref 212.0–360.0)

## 2014-07-24 LAB — FOLATE: Folate: 4.9 ng/mL — ABNORMAL LOW (ref >5.9)

## 2014-07-24 LAB — FERRITIN: FERRITIN: 6.7 ng/mL — AB (ref 10.0–291.0)

## 2014-07-24 MED ORDER — METHADONE HCL 10 MG PO TABS: ORAL_TABLET | ORAL | Status: DC

## 2014-07-24 NOTE — Progress Notes (Signed)
Subjective:    Patient ID: Emily [name], female    DOB: 15-Sep-1965, 48 y.o.   MRN: [id number]  Hypertension This is a new problem. The current episode started more than 1 year ago. The problem has been gradually improving since onset. The problem is controlled. Associated symptoms include anxiety and neck pain. Pertinent negatives include no blurred vision, chest pain, headaches, malaise/fatigue, orthopnea, palpitations, peripheral edema, PND, shortness of breath or sweats. There are no associated agents to hypertension. Risk factors for coronary artery disease include smoking/tobacco exposure. Past treatments include angiotensin blockers, calcium channel blockers and diuretics. The current treatment provides significant improvement. There are no compliance problems.  Hypertensive end-organ damage includes kidney disease. Identifiable causes of hypertension include chronic renal disease.      Review of Systems  Constitutional: Negative.  Negative for fever, chills, malaise/fatigue, diaphoresis, appetite change and fatigue.  HENT: Negative.   Eyes: Negative.  Negative for blurred vision.  Respiratory: Negative.  Negative for apnea, cough, choking, chest tightness, shortness of breath, wheezing and stridor.   Cardiovascular: Negative.  Negative for chest pain, palpitations, orthopnea, leg swelling and PND.  Gastrointestinal: Negative.  Negative for nausea, vomiting, abdominal pain, diarrhea, constipation and blood in stool.  Endocrine: Negative.   Genitourinary: Negative.   Musculoskeletal: Positive for neck pain. Negative for myalgias, back pain, joint swelling, arthralgias, gait problem and neck stiffness.  Skin: Negative.  Negative for rash.  Allergic/Immunologic: Negative.   Neurological: Negative.  Negative for headaches.  Hematological: Negative.  Negative for adenopathy. Does not bruise/bleed easily.  Psychiatric/Behavioral: Negative.  Negative for suicidal ideas, sleep  disturbance, self-injury, dysphoric mood and decreased concentration. The patient is not nervous/anxious.        Objective:   Physical Exam  Constitutional: She is oriented to person, place, and time. She appears well-developed and well-nourished. No distress.  HENT:  Head: Normocephalic and atraumatic.  Mouth/Throat: Oropharynx is clear and moist. No oropharyngeal exudate.  Eyes: Conjunctivae are normal. Right eye exhibits no discharge. Left eye exhibits no discharge. No scleral icterus.  Neck: Normal range of motion. Neck supple. No JVD present. No tracheal deviation present. No thyromegaly present.  Cardiovascular: Normal rate, regular rhythm, normal heart sounds and intact distal pulses.  Exam reveals no gallop.   No murmur heard. Pulmonary/Chest: Effort normal and breath sounds normal. No stridor. No respiratory distress. She has no wheezes. She has no rales. She exhibits no tenderness.  Abdominal: Soft. Bowel sounds are normal. She exhibits no distension and no mass. There is no tenderness. There is no rebound and no guarding.  Musculoskeletal: Normal range of motion. She exhibits no edema or tenderness.       Cervical back: Normal. She exhibits normal range of motion, no tenderness, no bony tenderness, no swelling, no edema, no deformity, no pain and no spasm.  Lymphadenopathy:    She has no cervical adenopathy.  Neurological: She is oriented to person, place, and time. She has normal strength. She displays no atrophy and no tremor. No cranial nerve deficit or sensory deficit. She exhibits normal muscle tone. She displays a negative Romberg sign. She displays no seizure activity.  Reflex Scores:      Tricep reflexes are 2+ on the right side and 2+ on the left side.      Bicep reflexes are 2+ on the right side and 2+ on the left side.      Brachioradialis reflexes are 2+ on the right side and 2+ on the left side.  Patellar reflexes are 2+ on the right side and 2+ on the left  side.      Achilles reflexes are 2+ on the right side and 2+ on the left side. Skin: Skin is warm and dry. No rash noted. She is not diaphoretic. No erythema. No pallor.  Psychiatric: She has a normal mood and affect. Her behavior is normal. Judgment and thought content normal.  Vitals reviewed.    Lab Results  Component Value Date   WBC 9.2 04/16/2014   HGB 11.3* 04/16/2014   HCT 34.7* 04/16/2014   PLT 341.0 04/16/2014   GLUCOSE 92 04/16/2014   CHOL 157 12/17/2013   TRIG 362.0* 12/17/2013   HDL 28.80* 12/17/2013   LDLDIRECT 84.6 06/19/2013   LDLCALC 56 12/17/2013   ALT 6 12/17/2013   AST 25 12/17/2013   NA 138 04/16/2014   K 3.8 04/16/2014   CL 99 04/16/2014   CREATININE 0.9 04/16/2014   BUN 4* 04/16/2014   CO2 32 04/16/2014   TSH 2.73 12/17/2013   INR 1.0 04/23/2008       Assessment & Plan:

## 2014-07-24 NOTE — Patient Instructions (Signed)

## 2014-07-25 ENCOUNTER — Encounter: Payer: Self-pay | Admitting: Internal Medicine

## 2014-07-25 MED ORDER — FOLIC ACID 1 MG PO TABS: 1.0000 mg | ORAL_TABLET | Freq: Every day | ORAL | Status: DC

## 2014-07-25 MED ORDER — FERRALET 90 90-1 MG PO TABS: 1.0000 | ORAL_TABLET | Freq: Every day | ORAL | Status: DC

## 2014-07-25 NOTE — Assessment & Plan Note (Signed)
Despite replacement with ferralet (she tells me that she has been compliant), she remains low on iron and folate, will refer to GI to see if she needs to be evaluated for GI blood loss or malabsorption

## 2014-07-25 NOTE — Assessment & Plan Note (Signed)
Her BP is well controlled There has been a very slight decrease in her renal function, will follow for now Her lytes are stable

## 2014-07-25 NOTE — Assessment & Plan Note (Signed)
Her folate level is slightly better but still in the low range I have asked her to start a folate replacement

## 2014-07-25 NOTE — Assessment & Plan Note (Signed)
She is doing well on methadone, will cont the same dose for now

## 2014-12-23 ENCOUNTER — Other Ambulatory Visit (INDEPENDENT_AMBULATORY_CARE_PROVIDER_SITE_OTHER): Payer: Self-pay

## 2014-12-23 ENCOUNTER — Ambulatory Visit (INDEPENDENT_AMBULATORY_CARE_PROVIDER_SITE_OTHER): Payer: Self-pay | Admitting: Internal Medicine

## 2014-12-23 ENCOUNTER — Encounter: Payer: Self-pay | Admitting: Internal Medicine

## 2014-12-23 VITALS — BP 162/84 | HR 127 | Temp 97.8°F | Resp 20 | Ht 60.0 in | Wt 158.0 lb

## 2014-12-23 DIAGNOSIS — I10 Essential (primary) hypertension: Secondary | ICD-10-CM

## 2014-12-23 DIAGNOSIS — G894 Chronic pain syndrome: Secondary | ICD-10-CM

## 2014-12-23 DIAGNOSIS — M4802 Spinal stenosis, cervical region: Secondary | ICD-10-CM

## 2014-12-23 DIAGNOSIS — R000 Tachycardia, unspecified: Secondary | ICD-10-CM

## 2014-12-23 DIAGNOSIS — Z1231 Encounter for screening mammogram for malignant neoplasm of breast: Secondary | ICD-10-CM

## 2014-12-23 DIAGNOSIS — E781 Pure hyperglyceridemia: Secondary | ICD-10-CM

## 2014-12-23 LAB — URINALYSIS, ROUTINE W REFLEX MICROSCOPIC
BILIRUBIN URINE: NEGATIVE
Ketones, ur: NEGATIVE
Leukocytes, UA: NEGATIVE
NITRITE: NEGATIVE
PH: 5.5 (ref 5.0–8.0)
Specific Gravity, Urine: <=1.005 — AB (ref 1.000–1.030)
TOTAL PROTEIN, URINE-UPE24: NEGATIVE
URINE GLUCOSE: NEGATIVE
Urobilinogen, UA: 0.2 (ref 0.0–1.0)
WBC, UA: NONE SEEN (ref 0–?)

## 2014-12-23 LAB — CBC WITH DIFFERENTIAL/PLATELET
Basophils Absolute: 0.1 10*3/uL (ref 0.0–0.1)
Basophils Relative: 0.6 % (ref 0.0–3.0)
EOS ABS: 0.1 10*3/uL (ref 0.0–0.7)
Eosinophils Relative: 0.7 % (ref 0.0–5.0)
HCT: 35 % — ABNORMAL LOW (ref 36.0–46.0)
HEMOGLOBIN: 11.1 g/dL — AB (ref 12.0–15.0)
Lymphocytes Relative: 21.3 % (ref 12.0–46.0)
Lymphs Abs: 2.5 10*3/uL (ref 0.7–4.0)
MCHC: 31.8 g/dL (ref 30.0–36.0)
MCV: 70.4 fl — AB (ref 78.0–100.0)
Monocytes Absolute: 0.8 10*3/uL (ref 0.1–1.0)
Monocytes Relative: 6.4 % (ref 3.0–12.0)
NEUTROS ABS: 8.4 10*3/uL — AB (ref 1.4–7.7)
Neutrophils Relative %: 71 % (ref 43.0–77.0)
Platelets: 436 10*3/uL — ABNORMAL HIGH (ref 150.0–400.0)
RBC: 4.97 Mil/uL (ref 3.87–5.11)
RDW: 15.8 % — AB (ref 11.5–15.5)
WBC: 11.8 10*3/uL — AB (ref 4.0–10.5)

## 2014-12-23 LAB — COMPREHENSIVE METABOLIC PANEL
ALBUMIN: 4.3 g/dL (ref 3.5–5.2)
ALT: 5 U/L (ref 0–35)
AST: 19 U/L (ref 0–37)
Alkaline Phosphatase: 117 U/L (ref 39–117)
BUN: 11 mg/dL (ref 6–23)
CALCIUM: 10.5 mg/dL (ref 8.4–10.5)
CHLORIDE: 91 meq/L — AB (ref 96–112)
CO2: 30 meq/L (ref 19–32)
CREATININE: 0.87 mg/dL (ref 0.40–1.20)
GFR: 73.23 mL/min (ref >60.00)
Glucose, Bld: 125 mg/dL — ABNORMAL HIGH (ref 70–99)
POTASSIUM: 3.5 meq/L (ref 3.5–5.1)
SODIUM: 131 meq/L — AB (ref 135–145)
Total Bilirubin: 0.2 mg/dL (ref 0.2–1.2)
Total Protein: 8.6 g/dL — ABNORMAL HIGH (ref 6.0–8.3)

## 2014-12-23 LAB — LDL CHOLESTEROL, DIRECT: Direct LDL: 74 mg/dL

## 2014-12-23 LAB — LIPID PANEL
Cholesterol: 137 mg/dL (ref 0–200)
HDL: 29 mg/dL — ABNORMAL LOW (ref >39.00)
NonHDL: 108
TRIGLYCERIDES: 309 mg/dL — AB (ref 0.0–149.0)
Total CHOL/HDL Ratio: 5
VLDL: 61.8 mg/dL — ABNORMAL HIGH (ref 0.0–40.0)

## 2014-12-23 LAB — TSH: TSH: 2.1 u[IU]/mL (ref 0.35–4.50)

## 2014-12-23 LAB — D-DIMER, QUANTITATIVE (NOT AT ARMC): D DIMER QUANT: 0.47 ug{FEU}/mL (ref 0.00–0.48)

## 2014-12-23 MED ORDER — METHADONE HCL 10 MG PO TABS: ORAL_TABLET | ORAL | Status: DC

## 2014-12-23 NOTE — Assessment & Plan Note (Signed)
She will cont methadone for pain relief

## 2014-12-23 NOTE — Progress Notes (Signed)
Pre visit review using our clinic review tool, if applicable. No additional management support is needed unless otherwise documented below in the visit note. 

## 2014-12-23 NOTE — Progress Notes (Signed)
Subjective:    Patient ID: Emily [name], female    DOB: Aug 27, 1965, 49 y.o.   MRN: [id number]  HPI Comments: Her HR is elevated today - she tells me that this only happens when she comes to the office, she rushed to get here today as she drove in from [place], she tells me that she is not taking any meds or stimulants that would raise her HR or BP though she has been cutting back on her methadone dose because she was running out and was not able to get here sooner.  Hypertension This is a chronic problem. The current episode started more than 1 year ago. The problem has been gradually worsening since onset. The problem is uncontrolled. Associated symptoms include anxiety and neck pain. Pertinent negatives include no blurred vision, chest pain, headaches, malaise/fatigue, orthopnea, palpitations, peripheral edema, PND, shortness of breath or sweats. Risk factors for coronary artery disease include smoking/tobacco exposure and stress. Past treatments include angiotensin blockers and calcium channel blockers. The current treatment provides mild improvement. Compliance problems include diet and exercise.       Review of Systems  Constitutional: Negative.  Negative for fever, chills, malaise/fatigue, diaphoresis, appetite change and fatigue.  HENT: Negative.  Negative for trouble swallowing.   Eyes: Negative.  Negative for blurred vision.  Respiratory: Negative.  Negative for cough, choking, chest tightness, shortness of breath and stridor.   Cardiovascular: Negative.  Negative for chest pain, palpitations, orthopnea, leg swelling and PND.  Gastrointestinal: Negative.  Negative for nausea, abdominal pain, diarrhea and blood in stool.  Endocrine: Negative.   Genitourinary: Negative.   Musculoskeletal: Positive for neck pain. Negative for myalgias, back pain, joint swelling, arthralgias, gait problem and neck stiffness.  Skin: Negative.  Negative for rash.  Allergic/Immunologic: Negative.    Neurological: Negative.  Negative for dizziness, tremors, weakness, light-headedness, numbness and headaches.  Hematological: Negative.  Negative for adenopathy. Does not bruise/bleed easily.  Psychiatric/Behavioral: Positive for dysphoric mood. Negative for suicidal ideas, hallucinations, behavioral problems, confusion, sleep disturbance, self-injury, decreased concentration and agitation. The patient is nervous/anxious. The patient is not hyperactive.        Objective:   Physical Exam  Constitutional: She is oriented to person, place, and time. She appears well-developed and well-nourished. No distress.  HENT:  Head: Normocephalic and atraumatic.  Mouth/Throat: Oropharynx is clear and moist. No oropharyngeal exudate.  Eyes: Conjunctivae are normal. Right eye exhibits no discharge. Left eye exhibits no discharge. No scleral icterus.  Neck: Normal range of motion. Neck supple. No JVD present. No tracheal deviation present. No thyromegaly present.  Cardiovascular: Regular rhythm, S1 normal, S2 normal, normal heart sounds and intact distal pulses.  Tachycardia present.  Exam reveals no gallop.   No murmur heard. Pulmonary/Chest: Effort normal and breath sounds normal. No stridor. No respiratory distress. She has no wheezes. She has no rales. She exhibits no tenderness.  Abdominal: Soft. Bowel sounds are normal. She exhibits no distension and no mass. There is no tenderness. There is no rebound and no guarding.  Musculoskeletal: Normal range of motion. She exhibits no edema or tenderness.  Lymphadenopathy:    She has no cervical adenopathy.  Neurological: She is oriented to person, place, and time.  Skin: Skin is warm and dry. No rash noted. She is not diaphoretic. No erythema. No pallor.  Psychiatric: Her behavior is normal. Judgment and thought content normal. Her mood appears anxious. Her affect is not angry, not labile and not inappropriate. She is not agitated,  not aggressive, not slowed,  not withdrawn and not combative. Thought content is not paranoid. Cognition and memory are normal. She does not exhibit a depressed mood. She expresses no homicidal and no suicidal ideation. She expresses no suicidal plans and no homicidal plans.  Vitals reviewed.    Lab Results  Component Value Date   WBC 11.9* 07/24/2014   HGB 11.4* 07/24/2014   HCT 35.7* 07/24/2014   PLT 360.0 07/24/2014   GLUCOSE 107* 07/24/2014   CHOL 157 12/17/2013   TRIG 362.0* 12/17/2013   HDL 28.80* 12/17/2013   LDLDIRECT 84.6 06/19/2013   LDLCALC 56 12/17/2013   ALT 6 07/24/2014   AST 17 07/24/2014   NA 137 07/24/2014   K 3.9 07/24/2014   CL 98 07/24/2014   CREATININE 1.1 07/24/2014   BUN 12 07/24/2014   CO2 28 07/24/2014   TSH 2.73 12/17/2013   INR 1.0 04/23/2008       Assessment & Plan:

## 2014-12-23 NOTE — Assessment & Plan Note (Signed)
Will recheck her lipid panel today Will treat if trigs are >500

## 2014-12-23 NOTE — Assessment & Plan Note (Signed)
Her BP is not very well controlled Will check her labs for secondary causes She was asked to quit smoking and to work on her lifestyle modifications Will re-evaluate her BP at the next visit

## 2014-12-23 NOTE — Assessment & Plan Note (Signed)
Her EKG shows a mild sinus tachy with non-specific changes in the inferior leads Will get a 48 hour monitor done to see if this is just a white coat phenomenon Will check for PE with a d-dimer She has abused stimulants before so will check a UDS and will also check other labs for secondary causes of elevated HR and BP

## 2014-12-23 NOTE — Patient Instructions (Signed)

## 2014-12-24 ENCOUNTER — Encounter: Payer: Self-pay | Admitting: Internal Medicine

## 2014-12-24 ENCOUNTER — Ambulatory Visit: Payer: Self-pay | Admitting: Internal Medicine

## 2014-12-24 LAB — DRUGS OF ABUSE SCREEN W/O ALC, ROUTINE URINE
Amphetamine Screen, Ur: NEGATIVE
Barbiturate Quant, Ur: NEGATIVE
Benzodiazepines.: NEGATIVE
Cocaine Metabolites: NEGATIVE
Creatinine,U: 21.2 mg/dL
Marijuana Metabolite: NEGATIVE
Methadone: POSITIVE — AB
Opiate Screen, Urine: NEGATIVE
Phencyclidine (PCP): NEGATIVE
Propoxyphene: NEGATIVE

## 2014-12-28 LAB — METHADONE (GC/LC/MS), URINE
EDDP (GC/LC/MS), ur confirm: 3866 ng/mL — ABNORMAL HIGH (ref <100)
Methadone (GC/LC/MS), ur confirm: 6777 ng/mL — ABNORMAL HIGH (ref <100)

## 2015-02-20 ENCOUNTER — Other Ambulatory Visit (INDEPENDENT_AMBULATORY_CARE_PROVIDER_SITE_OTHER): Payer: Self-pay

## 2015-02-20 ENCOUNTER — Encounter: Payer: Self-pay | Admitting: Internal Medicine

## 2015-02-20 ENCOUNTER — Ambulatory Visit (INDEPENDENT_AMBULATORY_CARE_PROVIDER_SITE_OTHER): Payer: Self-pay | Admitting: Internal Medicine

## 2015-02-20 VITALS — BP 152/94 | HR 116 | Temp 98.3°F | Resp 16 | Wt 158.0 lb

## 2015-02-20 DIAGNOSIS — F341 Dysthymic disorder: Secondary | ICD-10-CM

## 2015-02-20 DIAGNOSIS — D509 Iron deficiency anemia, unspecified: Secondary | ICD-10-CM

## 2015-02-20 DIAGNOSIS — I10 Essential (primary) hypertension: Secondary | ICD-10-CM

## 2015-02-20 DIAGNOSIS — G894 Chronic pain syndrome: Secondary | ICD-10-CM

## 2015-02-20 DIAGNOSIS — D529 Folate deficiency anemia, unspecified: Secondary | ICD-10-CM

## 2015-02-20 DIAGNOSIS — M4802 Spinal stenosis, cervical region: Secondary | ICD-10-CM

## 2015-02-20 LAB — CBC WITH DIFFERENTIAL/PLATELET
BASOS PCT: 0.5 % (ref 0.0–3.0)
Basophils Absolute: 0 10*3/uL (ref 0.0–0.1)
Eosinophils Absolute: 0.1 10*3/uL (ref 0.0–0.7)
Eosinophils Relative: 1.5 % (ref 0.0–5.0)
HEMATOCRIT: 30 % — AB (ref 36.0–46.0)
HEMOGLOBIN: 9.4 g/dL — AB (ref 12.0–15.0)
Lymphocytes Relative: 28.1 % (ref 12.0–46.0)
Lymphs Abs: 2.6 10*3/uL (ref 0.7–4.0)
MCHC: 31.2 g/dL (ref 30.0–36.0)
Monocytes Absolute: 0.6 10*3/uL (ref 0.1–1.0)
Monocytes Relative: 6.2 % (ref 3.0–12.0)
Neutro Abs: 5.8 10*3/uL (ref 1.4–7.7)
Neutrophils Relative %: 63.7 % (ref 43.0–77.0)
Platelets: 355 10*3/uL (ref 150.0–400.0)
RBC: 4.36 Mil/uL (ref 3.87–5.11)
RDW: 16.3 % — ABNORMAL HIGH (ref 11.5–15.5)
WBC: 9.1 10*3/uL (ref 4.0–10.5)

## 2015-02-20 LAB — IBC PANEL
Iron: 56 ug/dL (ref 42–145)
Saturation Ratios: 11.5 % — ABNORMAL LOW (ref 20.0–50.0)
TRANSFERRIN: 347 mg/dL (ref 212.0–360.0)

## 2015-02-20 LAB — FOLATE: FOLATE: 13.2 ng/mL (ref >5.9)

## 2015-02-20 LAB — BASIC METABOLIC PANEL
BUN: 8 mg/dL (ref 6–23)
CALCIUM: 9 mg/dL (ref 8.4–10.5)
CO2: 29 mEq/L (ref 19–32)
CREATININE: 0.92 mg/dL (ref 0.40–1.20)
Chloride: 101 mEq/L (ref 96–112)
GFR: 68.61 mL/min (ref >60.00)
Glucose, Bld: 119 mg/dL — ABNORMAL HIGH (ref 70–99)
POTASSIUM: 3.5 meq/L (ref 3.5–5.1)
Sodium: 136 mEq/L (ref 135–145)

## 2015-02-20 LAB — CORTISOL: Cortisol, Plasma: 9.6 ug/dL

## 2015-02-20 LAB — FERRITIN: FERRITIN: 4.7 ng/mL — AB (ref 10.0–291.0)

## 2015-02-20 MED ORDER — METHADONE HCL 10 MG PO TABS: ORAL_TABLET | ORAL | Status: DC

## 2015-02-20 MED ORDER — FOLIC ACID 1 MG PO TABS: 1.0000 mg | ORAL_TABLET | Freq: Every day | ORAL | Status: DC

## 2015-02-20 NOTE — Progress Notes (Signed)
Subjective:  Patient ID: Emily [name], female    DOB: [birth year]  Age: 49 y.o. MRN: [id number]  CC: Hypertension   HPI Emily [name] presents for a blood pressure check. She was seen 2 months ago she had a low sodium and chloride level. She admits that she does not take the Tribenzor every day and has trouble controlling her blood pressure. She suffers from chronic pain and anxiety but otherwise offers no complaints.  Outpatient Prescriptions Prior to Visit  Medication Sig Dispense Refill  . Cholecalciferol 50000 UNITS TABS Take 1 tablet by mouth once a week. 12 tablet 3  . Fe Cbn-Fe Gluc-FA-B12-C-DSS (FERRALET 90) 90-1 MG TABS Take 1 tablet by mouth daily. 90 each 3  . loperamide (IMODIUM A-D) 2 MG tablet Take 2 mg by mouth as needed.      . Olmesartan-Amlodipine-HCTZ (TRIBENZOR) 40-10-25 MG TABS Take 1 tablet by mouth daily. 30 tablet 11  . potassium chloride SA (K-DUR,KLOR-CON) 20 MEQ tablet Take 1 tablet (20 mEq total) by mouth 2 (two) times daily. 180 tablet 1  . sertraline (ZOLOFT) 100 MG tablet TAKE 1 TABLET IN THE MORNING AND 1/2 TAB IN THE EVENING. 45 tablet 2  . folic acid (FOLVITE) 1 MG tablet Take 1 tablet (1 mg total) by mouth daily. 90 tablet 1  . methadone (DOLOPHINE) 10 MG tablet Take a total of 50 mg [5 tablets] TID 450 tablet 0   No facility-administered medications prior to visit.    ROS Review of Systems  Constitutional: Negative.  Negative for fever, chills, diaphoresis, appetite change and fatigue.  HENT: Negative.  Negative for congestion and trouble swallowing.   Eyes: Negative.   Respiratory: Negative.  Negative for cough, choking, chest tightness, shortness of breath and stridor.   Cardiovascular: Negative.  Negative for chest pain, palpitations and leg swelling.  Gastrointestinal: Negative.  Negative for nausea, vomiting, abdominal pain, diarrhea, constipation and blood in stool.  Endocrine: Negative.   Genitourinary: Negative.  Negative for  dysuria, urgency, frequency, hematuria, difficulty urinating and pelvic pain.  Musculoskeletal: Positive for neck pain.  Skin: Negative.  Negative for rash.  Allergic/Immunologic: Negative.   Neurological: Negative.  Negative for dizziness, tremors, seizures, weakness and numbness.  Hematological: Negative.  Negative for adenopathy. Does not bruise/bleed easily.  Psychiatric/Behavioral: Negative for suicidal ideas, hallucinations, behavioral problems, confusion, sleep disturbance, self-injury, dysphoric mood, decreased concentration and agitation. The patient is nervous/anxious. The patient is not hyperactive.     Objective:  BP 152/94 mmHg  Pulse 116  Temp(Src) 98.3 F (36.8 C) (Oral)  Resp 16  Wt 158 lb (71.668 kg)  SpO2 97%  BP Readings from Last 3 Encounters:  02/20/15 152/94  12/23/14 162/84  07/24/14 140/84    Wt Readings from Last 3 Encounters:  02/20/15 158 lb (71.668 kg)  12/23/14 158 lb (71.668 kg)  07/24/14 144 lb (65.318 kg)    Physical Exam  Constitutional: She is oriented to person, place, and time.  Non-toxic appearance. She does not have a sickly appearance. She does not appear ill. No distress.  HENT:  Mouth/Throat: Oropharynx is clear and moist. No oropharyngeal exudate.  Eyes: Conjunctivae are normal. Right eye exhibits no discharge. Left eye exhibits no discharge. No scleral icterus.  Neck: Normal range of motion. Neck supple. No JVD present. No tracheal deviation present. No thyromegaly present.  Cardiovascular: Normal rate, regular rhythm, normal heart sounds and intact distal pulses.  Exam reveals no gallop and no friction rub.   No  murmur heard. Pulmonary/Chest: Effort normal and breath sounds normal. No stridor. No respiratory distress. She has no wheezes. She has no rales. She exhibits no tenderness.  Abdominal: Soft. Bowel sounds are normal. She exhibits no distension and no mass. There is no tenderness. There is no rebound and no guarding.    Musculoskeletal: Normal range of motion. She exhibits no edema or tenderness.  Lymphadenopathy:    She has no cervical adenopathy.  Neurological: She is oriented to person, place, and time.  Skin: Skin is warm and dry. No rash noted. She is not diaphoretic. No erythema. No pallor.  Psychiatric: Judgment and thought content normal. Her mood appears anxious. Her speech is not rapid and/or pressured. She is not agitated, not slowed and not combative. Cognition and memory are normal. She does not exhibit a depressed mood. She expresses no homicidal and no suicidal ideation. She expresses no suicidal plans and no homicidal plans. She is attentive.    Lab Results  Component Value Date   WBC 11.8* 12/23/2014   HGB 11.1* 12/23/2014   HCT 35.0* 12/23/2014   PLT 436.0* 12/23/2014   GLUCOSE 125* 12/23/2014   CHOL 137 12/23/2014   TRIG 309.0* 12/23/2014   HDL 29.00* 12/23/2014   LDLDIRECT 74.0 12/23/2014   LDLCALC 56 12/17/2013   ALT 5 12/23/2014   AST 19 12/23/2014   NA 131* 12/23/2014   K 3.5 12/23/2014   CL 91* 12/23/2014   CREATININE 0.87 12/23/2014   BUN 11 12/23/2014   CO2 30 12/23/2014   TSH 2.10 12/23/2014   INR 1.0 04/23/2008    No results found.  Assessment & Plan:   Emily [name] was seen today for hypertension.  Diagnoses and all orders for this visit:  Essential hypertension- blood pressure is not adequately well controlled but she also agrees to some noncompliance with her daily dose of tribenzor. We talked about this today and she agrees to get better blood pressure control with better compliance. Since she has recently had a low sodium and chloride level I'll check her labs today to screen for adrenal insufficiency. She has a history of using amphetamines as well so we'll also check her urine drug screen for any exposure to amphetamines. Will continue to monitor her lites and renal function. She was asked to quit smoking to lower her blood pressure as well. Orders: -      Basic metabolic panel; Future -     Urinalysis, Routine w reflex microscopic (not at [facility]); Future -     Cortisol; Future -     ACTH  Anemia, iron deficiency- I will recheck her H&H today and will monitor her for iron level. 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 Orders: -     CBC with Differential/Platelet; Future -     Folate; Future -     folic acid (FOLVITE) 1 MG tablet; Take 1 tablet (1 mg total) by mouth daily.  Cervical spinal stenosis Orders: -     Discontinue: methadone (DOLOPHINE) 10 MG tablet; Take a total of 50 mg [5 tablets] TID -     methadone (DOLOPHINE) 10 MG tablet; Take a total of 50 mg [5 tablets] TID  Chronic pain syndrome Orders: -     Drugs of  abuse screen w/o alc, rtn urine-sln; Future -     Discontinue: methadone (DOLOPHINE) 10 MG tablet; Take a total of 50 mg [5 tablets] TID -     methadone (DOLOPHINE) 10 MG tablet; Take a total of 50 mg [5 tablets] TID  I am having Emily [name] maintain her loperamide, sertraline, potassium chloride SA, Cholecalciferol, Olmesartan-Amlodipine-HCTZ, FERRALET 90, folic acid, and methadone.  Meds ordered this encounter  Medications  . folic acid (FOLVITE) 1 MG tablet    Sig: Take 1 tablet (1 mg total) by mouth daily.    Dispense:  90 tablet    Refill:  3  . DISCONTD: methadone (DOLOPHINE) 10 MG tablet    Sig: Take a total of 50 mg [5 tablets] TID    Dispense:  450 tablet    Refill:  0    Fill on/after 02/20/2015  . methadone (DOLOPHINE) 10 MG tablet    Sig: Take a total of 50 mg [5 tablets] TID    Dispense:  450 tablet    Refill:  0    Fill on/after 03/23/2015     Follow-up: Return in about 2 months (around 04/23/2015).  Sanda Lingerhomas Stori Royse, MD

## 2015-02-20 NOTE — Patient Instructions (Signed)

## 2015-02-20 NOTE — Progress Notes (Signed)
Pre visit review using our clinic review tool, if applicable. No additional management support is needed unless otherwise documented below in the visit note. 

## 2015-02-21 LAB — DRUGS OF ABUSE SCREEN W/O ALC, ROUTINE URINE
AMPHETAMINE SCRN UR: NEGATIVE
Barbiturate Quant, Ur: NEGATIVE
Benzodiazepines.: NEGATIVE
CREATININE, U: 120.5 mg/dL
Cocaine Metabolites: NEGATIVE
METHADONE: POSITIVE — AB
Marijuana Metabolite: NEGATIVE
OPIATE SCREEN, URINE: NEGATIVE
PHENCYCLIDINE (PCP): NEGATIVE
PROPOXYPHENE: NEGATIVE

## 2015-02-24 ENCOUNTER — Ambulatory Visit: Payer: Self-pay | Admitting: Internal Medicine

## 2015-02-24 LAB — METHADONE (GC/LC/MS), URINE
EDDP (GC/LC/MS), ur confirm: 26361 ng/mL — ABNORMAL HIGH (ref <100)
Methadone (GC/LC/MS), ur confirm: 30901 ng/mL — ABNORMAL HIGH (ref <100)

## 2015-02-25 ENCOUNTER — Encounter: Payer: Self-pay | Admitting: Internal Medicine

## 2015-04-07 ENCOUNTER — Encounter: Payer: Self-pay | Admitting: Internal Medicine

## 2015-04-21 ENCOUNTER — Ambulatory Visit: Payer: Self-pay | Admitting: Internal Medicine

## 2015-04-22 ENCOUNTER — Encounter: Payer: Self-pay | Admitting: Internal Medicine

## 2015-04-24 ENCOUNTER — Other Ambulatory Visit (INDEPENDENT_AMBULATORY_CARE_PROVIDER_SITE_OTHER): Payer: Self-pay

## 2015-04-24 ENCOUNTER — Encounter: Payer: Self-pay | Admitting: Internal Medicine

## 2015-04-24 ENCOUNTER — Ambulatory Visit (INDEPENDENT_AMBULATORY_CARE_PROVIDER_SITE_OTHER): Payer: Self-pay | Admitting: Internal Medicine

## 2015-04-24 VITALS — BP 146/84 | HR 106 | Temp 98.4°F | Resp 16 | Ht 60.0 in | Wt 158.0 lb

## 2015-04-24 DIAGNOSIS — D509 Iron deficiency anemia, unspecified: Secondary | ICD-10-CM

## 2015-04-24 DIAGNOSIS — G894 Chronic pain syndrome: Secondary | ICD-10-CM

## 2015-04-24 DIAGNOSIS — E876 Hypokalemia: Secondary | ICD-10-CM

## 2015-04-24 DIAGNOSIS — M4802 Spinal stenosis, cervical region: Secondary | ICD-10-CM

## 2015-04-24 DIAGNOSIS — Z23 Encounter for immunization: Secondary | ICD-10-CM

## 2015-04-24 DIAGNOSIS — I10 Essential (primary) hypertension: Secondary | ICD-10-CM

## 2015-04-24 LAB — CBC WITH DIFFERENTIAL/PLATELET
BASOS ABS: 0.1 10*3/uL (ref 0.0–0.1)
Basophils Relative: 1.1 % (ref 0.0–3.0)
EOS ABS: 0.1 10*3/uL (ref 0.0–0.7)
Eosinophils Relative: 0.9 % (ref 0.0–5.0)
HEMATOCRIT: 30.9 % — AB (ref 36.0–46.0)
HEMOGLOBIN: 9.7 g/dL — AB (ref 12.0–15.0)
LYMPHS PCT: 24.6 % (ref 12.0–46.0)
Lymphs Abs: 2.4 10*3/uL (ref 0.7–4.0)
MCHC: 31.3 g/dL (ref 30.0–36.0)
MONOS PCT: 7.4 % (ref 3.0–12.0)
Monocytes Absolute: 0.7 10*3/uL (ref 0.1–1.0)
Neutro Abs: 6.4 10*3/uL (ref 1.4–7.7)
Neutrophils Relative %: 66 % (ref 43.0–77.0)
Platelets: 341 10*3/uL (ref 150.0–400.0)
RBC: 4.61 Mil/uL (ref 3.87–5.11)
RDW: 18.3 % — ABNORMAL HIGH (ref 11.5–15.5)
WBC: 9.8 10*3/uL (ref 4.0–10.5)

## 2015-04-24 LAB — BASIC METABOLIC PANEL
BUN: 7 mg/dL (ref 6–23)
CHLORIDE: 97 meq/L (ref 96–112)
CO2: 31 meq/L (ref 19–32)
CREATININE: 0.78 mg/dL (ref 0.40–1.20)
Calcium: 9.1 mg/dL (ref 8.4–10.5)
GFR: 82.95 mL/min (ref >60.00)
Glucose, Bld: 104 mg/dL — ABNORMAL HIGH (ref 70–99)
POTASSIUM: 3.4 meq/L — AB (ref 3.5–5.1)
Sodium: 135 mEq/L (ref 135–145)

## 2015-04-24 MED ORDER — OLMESARTAN-AMLODIPINE-HCTZ 40-10-25 MG PO TABS: 1.0000 | ORAL_TABLET | Freq: Every day | ORAL | Status: DC

## 2015-04-24 MED ORDER — METHADONE HCL 10 MG PO TABS: ORAL_TABLET | ORAL | Status: DC

## 2015-04-24 NOTE — Progress Notes (Signed)
Subjective:  Patient ID: Emily [name], female    DOB: Oct 30, 1965  Age: 49 y.o. MRN: [id number]  CC: Hypertension   HPI Emily [name] presents for f/up on anemia, hypertension, and chronic pain. She feels better than usual and offers no new complaints.  Outpatient Prescriptions Prior to Visit  Medication Sig Dispense Refill  . Cholecalciferol 50000 UNITS TABS Take 1 tablet by mouth once a week. 12 tablet 3  . Fe Cbn-Fe Gluc-FA-B12-C-DSS (FERRALET 90) 90-1 MG TABS Take 1 tablet by mouth daily. 90 each 3  . folic acid (FOLVITE) 1 MG tablet Take 1 tablet (1 mg total) by mouth daily. 90 tablet 3  . loperamide (IMODIUM A-D) 2 MG tablet Take 2 mg by mouth as needed.      . potassium chloride SA (K-DUR,KLOR-CON) 20 MEQ tablet Take 1 tablet (20 mEq total) by mouth 2 (two) times daily. 180 tablet 1  . sertraline (ZOLOFT) 100 MG tablet TAKE 1 TABLET IN THE MORNING AND 1/2 TAB IN THE EVENING. 45 tablet 2  . methadone (DOLOPHINE) 10 MG tablet Take a total of 50 mg [5 tablets] TID 450 tablet 0  . Olmesartan-Amlodipine-HCTZ (TRIBENZOR) 40-10-25 MG TABS Take 1 tablet by mouth daily. 30 tablet 11   No facility-administered medications prior to visit.    ROS Review of Systems  Constitutional: Negative.  Negative for fever, chills, diaphoresis, appetite change and fatigue.  HENT: Negative.   Eyes: Negative.   Respiratory: Negative.  Negative for cough, choking, chest tightness, shortness of breath and stridor.   Cardiovascular: Negative.  Negative for chest pain, palpitations and leg swelling.  Gastrointestinal: Negative.  Negative for nausea, vomiting, abdominal pain, diarrhea, constipation and blood in stool.  Endocrine: Negative.   Genitourinary: Negative.   Musculoskeletal: Positive for neck pain.  Skin: Negative.  Negative for rash.  Allergic/Immunologic: Negative.   Neurological: Negative.  Negative for dizziness, tremors, syncope, light-headedness, numbness and headaches.    Hematological: Negative.  Negative for adenopathy. Does not bruise/bleed easily.  Psychiatric/Behavioral: Positive for sleep disturbance and dysphoric mood. Negative for suicidal ideas, hallucinations, behavioral problems, confusion, self-injury, decreased concentration and agitation. The patient is nervous/anxious. The patient is not hyperactive.     Objective:  BP 146/84 mmHg  Pulse 106  Temp(Src) 98.4 F (36.9 C) (Oral)  Resp 16  Ht 5' (1.524 m)  Wt 158 lb (71.668 kg)  BMI 30.86 kg/m2  SpO2 98%  BP Readings from Last 3 Encounters:  04/24/15 146/84  02/20/15 152/94  12/23/14 162/84    Wt Readings from Last 3 Encounters:  04/24/15 158 lb (71.668 kg)  02/20/15 158 lb (71.668 kg)  12/23/14 158 lb (71.668 kg)    Physical Exam  Constitutional: She is oriented to person, place, and time. No distress.  HENT:  Mouth/Throat: Oropharynx is clear and moist. No oropharyngeal exudate.  Eyes: Conjunctivae are normal. Right eye exhibits no discharge. Left eye exhibits no discharge. No scleral icterus.  Neck: Normal range of motion. Neck supple. No JVD present. No tracheal deviation present. No thyromegaly present.  Cardiovascular: Normal rate, regular rhythm, normal heart sounds and intact distal pulses.  Exam reveals no gallop and no friction rub.   No murmur heard. Pulmonary/Chest: Effort normal and breath sounds normal. No stridor. No respiratory distress. She has no wheezes. She has no rales. She exhibits no tenderness.  Abdominal: Soft. Bowel sounds are normal. She exhibits no distension and no mass. There is no tenderness. There is no rebound and no  guarding.  Musculoskeletal: Normal range of motion. She exhibits no edema.  Lymphadenopathy:    She has no cervical adenopathy.  Neurological: She is oriented to person, place, and time.  Skin: Skin is warm and dry. No rash noted. She is not diaphoretic. No erythema. No pallor.  Psychiatric: Judgment and thought content normal. Her  mood appears anxious. Her speech is not rapid and/or pressured, not delayed and not tangential. She is not agitated, not slowed and not withdrawn. Cognition and memory are normal. Cognition and memory are not impaired. She does not exhibit a depressed mood. She expresses no homicidal and no suicidal ideation. She expresses no suicidal plans and no homicidal plans. She exhibits normal recent memory and normal remote memory. She is attentive.    Lab Results  Component Value Date   WBC 9.8 04/24/2015   HGB 9.7* 04/24/2015   HCT 30.9* 04/24/2015   PLT 341.0 04/24/2015   GLUCOSE 104* 04/24/2015   CHOL 137 12/23/2014   TRIG 309.0* 12/23/2014   HDL 29.00* 12/23/2014   LDLDIRECT 74.0 12/23/2014   LDLCALC 56 12/17/2013   ALT 5 12/23/2014   AST 19 12/23/2014   NA 135 04/24/2015   K 3.4* 04/24/2015   CL 97 04/24/2015   CREATININE 0.78 04/24/2015   BUN 7 04/24/2015   CO2 31 04/24/2015   TSH 2.10 12/23/2014   INR 1.0 04/23/2008    No results found.  Assessment & Plan:   Haniah was seen today for hypertension.  Diagnoses and all orders for this visit:  Essential hypertension- her BP is adequately well controlled, her lytes and renal function are stable  Need for influenza vaccination -     Flu Vaccine QUAD 36+ mos IM  Anemia, iron deficiency- her anemia has not improved much since she is not compliant with iron replacement therapy, I have asked her to be more compliant   Hypokalemia- her K+ level is still slightly low, I have asked her to increase her K+ replacement therapy to TID   I am having Ms. Sullivan Lone maintain her loperamide, sertraline, potassium chloride SA, Cholecalciferol, FERRALET 90, folic acid, Olmesartan-Amlodipine-HCTZ, and methadone.  Meds ordered this encounter  Medications  . DISCONTD: methadone (DOLOPHINE) 10 MG tablet    Sig: Take a total of 50 mg [5 tablets] TID    Dispense:  450 tablet    Refill:  0    Fill on/after 04/24/2015  . DISCONTD: methadone  (DOLOPHINE) 10 MG tablet    Sig: Take a total of 50 mg [5 tablets] TID    Dispense:  450 tablet    Refill:  0    Fill on/after 05/24/2015  . DISCONTD: methadone (DOLOPHINE) 10 MG tablet    Sig: Take a total of 50 mg [5 tablets] TID    Dispense:  450 tablet    Refill:  0    Fill on/after 06/24/2015  . DISCONTD: Olmesartan-Amlodipine-HCTZ (TRIBENZOR) 40-10-25 MG TABS    Sig: Take 1 tablet by mouth daily.    Dispense:  30 tablet    Refill:  11  . Olmesartan-Amlodipine-HCTZ (TRIBENZOR) 40-10-25 MG TABS    Sig: Take 1 tablet by mouth daily.    Dispense:  30 tablet    Refill:  11  . methadone (DOLOPHINE) 10 MG tablet    Sig: Take a total of 50 mg [5 tablets] TID    Dispense:  450 tablet    Refill:  0    Fill on/after 07/24/2015     Follow-up:  Return in about 4 months (around 08/24/2015).  Sanda Linger, MD

## 2015-04-24 NOTE — Patient Instructions (Signed)

## 2015-04-24 NOTE — Progress Notes (Signed)
Pre visit review using our clinic review tool, if applicable. No additional management support is needed unless otherwise documented below in the visit note. 

## 2015-04-27 MED ORDER — POTASSIUM CHLORIDE CRYS ER 20 MEQ PO TBCR: 20.0000 meq | EXTENDED_RELEASE_TABLET | Freq: Three times a day (TID) | ORAL | Status: DC

## 2015-04-27 NOTE — Assessment & Plan Note (Signed)
She is stable and doing well on methadone  Will continue

## 2015-04-28 ENCOUNTER — Encounter: Payer: Self-pay | Admitting: Internal Medicine

## 2015-05-23 ENCOUNTER — Encounter: Payer: Self-pay | Admitting: Internal Medicine

## 2015-09-09 ENCOUNTER — Ambulatory Visit: Payer: Self-pay | Admitting: Internal Medicine

## 2015-09-11 ENCOUNTER — Other Ambulatory Visit (INDEPENDENT_AMBULATORY_CARE_PROVIDER_SITE_OTHER): Payer: PRIVATE HEALTH INSURANCE

## 2015-09-11 ENCOUNTER — Encounter: Payer: Self-pay | Admitting: Internal Medicine

## 2015-09-11 ENCOUNTER — Ambulatory Visit (INDEPENDENT_AMBULATORY_CARE_PROVIDER_SITE_OTHER): Payer: PRIVATE HEALTH INSURANCE | Admitting: Internal Medicine

## 2015-09-11 VITALS — BP 160/100 | HR 88 | Temp 98.0°F | Resp 16 | Ht 60.0 in | Wt 157.0 lb

## 2015-09-11 DIAGNOSIS — D509 Iron deficiency anemia, unspecified: Secondary | ICD-10-CM

## 2015-09-11 DIAGNOSIS — G894 Chronic pain syndrome: Secondary | ICD-10-CM | POA: Diagnosis not present

## 2015-09-11 DIAGNOSIS — D529 Folate deficiency anemia, unspecified: Secondary | ICD-10-CM | POA: Diagnosis not present

## 2015-09-11 DIAGNOSIS — M4802 Spinal stenosis, cervical region: Secondary | ICD-10-CM

## 2015-09-11 DIAGNOSIS — E876 Hypokalemia: Secondary | ICD-10-CM | POA: Diagnosis not present

## 2015-09-11 DIAGNOSIS — I10 Essential (primary) hypertension: Secondary | ICD-10-CM

## 2015-09-11 LAB — CBC WITH DIFFERENTIAL/PLATELET
BASOS PCT: 0.7 % (ref 0.0–3.0)
Basophils Absolute: 0.1 10*3/uL (ref 0.0–0.1)
EOS PCT: 0.6 % (ref 0.0–5.0)
Eosinophils Absolute: 0.1 10*3/uL (ref 0.0–0.7)
HCT: 30.3 % — ABNORMAL LOW (ref 36.0–46.0)
HEMOGLOBIN: 9.2 g/dL — AB (ref 12.0–15.0)
Lymphocytes Relative: 23.6 % (ref 12.0–46.0)
Lymphs Abs: 2.3 10*3/uL (ref 0.7–4.0)
MCHC: 30.3 g/dL (ref 30.0–36.0)
MCV: 64.4 fl — AB (ref 78.0–100.0)
MONOS PCT: 5.3 % (ref 3.0–12.0)
Monocytes Absolute: 0.5 10*3/uL (ref 0.1–1.0)
Neutro Abs: 6.7 10*3/uL (ref 1.4–7.7)
Neutrophils Relative %: 69.8 % (ref 43.0–77.0)
Platelets: 316 10*3/uL (ref 150.0–400.0)
RBC: 4.7 Mil/uL (ref 3.87–5.11)
RDW: 17.2 % — ABNORMAL HIGH (ref 11.5–15.5)
WBC: 9.6 10*3/uL (ref 4.0–10.5)

## 2015-09-11 LAB — IBC PANEL
Iron: 17 ug/dL — ABNORMAL LOW (ref 42–145)
Saturation Ratios: 2.9 % — ABNORMAL LOW (ref 20.0–50.0)
Transferrin: 415 mg/dL — ABNORMAL HIGH (ref 212.0–360.0)

## 2015-09-11 LAB — BASIC METABOLIC PANEL
BUN: 9 mg/dL (ref 6–23)
CALCIUM: 9.5 mg/dL (ref 8.4–10.5)
CO2: 28 meq/L (ref 19–32)
CREATININE: 0.84 mg/dL (ref 0.40–1.20)
Chloride: 97 mEq/L (ref 96–112)
GFR: 76.03 mL/min (ref >60.00)
GLUCOSE: 101 mg/dL — AB (ref 70–99)
Potassium: 3.5 mEq/L (ref 3.5–5.1)
Sodium: 134 mEq/L — ABNORMAL LOW (ref 135–145)

## 2015-09-11 LAB — FERRITIN: Ferritin: 3.3 ng/mL — ABNORMAL LOW (ref 10.0–291.0)

## 2015-09-11 LAB — URINALYSIS, ROUTINE W REFLEX MICROSCOPIC
Bilirubin Urine: NEGATIVE
Ketones, ur: NEGATIVE
Leukocytes, UA: NEGATIVE
NITRITE: NEGATIVE
RBC / HPF: NONE SEEN (ref 0–?)
TOTAL PROTEIN, URINE-UPE24: NEGATIVE
Urine Glucose: NEGATIVE
Urobilinogen, UA: 0.2 (ref 0.0–1.0)
WBC UA: NONE SEEN (ref 0–?)
pH: 6 (ref 5.0–8.0)

## 2015-09-11 LAB — FOLATE: FOLATE: 6.2 ng/mL (ref >5.9)

## 2015-09-11 MED ORDER — METHADONE HCL 10 MG PO TABS: ORAL_TABLET | ORAL | Status: DC

## 2015-09-11 MED ORDER — FERRALET 90 90-1 MG PO TABS: 1.0000 | ORAL_TABLET | Freq: Every day | ORAL | Status: DC

## 2015-09-11 NOTE — Patient Instructions (Signed)
Hypertension Hypertension, commonly called high blood pressure, is when the force of blood pumping through your arteries is too strong. Your arteries are the blood vessels that carry blood from your heart throughout your body. A blood pressure reading consists of a higher number over a lower number, such as 110/72. The higher number (systolic) is the pressure inside your arteries when your heart pumps. The lower number (diastolic) is the pressure inside your arteries when your heart relaxes. Ideally you want your blood pressure below 120/80. Hypertension forces your heart to work harder to pump blood. Your arteries may become narrow or stiff. Having untreated or uncontrolled hypertension can cause heart attack, stroke, kidney disease, and other problems. RISK FACTORS Some risk factors for high blood pressure are controllable. Others are not.  Risk factors you cannot control include:   Race. You may be at higher risk if you are African American.  Age. Risk increases with age.  Gender. Men are at higher risk than women before age 45 years. After age 65, women are at higher risk than men. Risk factors you can control include:  Not getting enough exercise or physical activity.  Being overweight.  Getting too much fat, sugar, calories, or salt in your diet.  Drinking too much alcohol. SIGNS AND SYMPTOMS Hypertension does not usually cause signs or symptoms. Extremely high blood pressure (hypertensive crisis) may cause headache, anxiety, shortness of breath, and nosebleed. DIAGNOSIS To check if you have hypertension, your health care provider will measure your blood pressure while you are seated, with your arm held at the level of your heart. It should be measured at least twice using the same arm. Certain conditions can cause a difference in blood pressure between your right and left arms. A blood pressure reading that is higher than normal on one occasion does not mean that you need treatment. If  it is not clear whether you have high blood pressure, you may be asked to return on a different day to have your blood pressure checked again. Or, you may be asked to monitor your blood pressure at home for 1 or more weeks. TREATMENT Treating high blood pressure includes making lifestyle changes and possibly taking medicine. Living a healthy lifestyle can help lower high blood pressure. You may need to change some of your habits. Lifestyle changes may include:  Following the DASH diet. This diet is high in fruits, vegetables, and whole grains. It is low in salt, red meat, and added sugars.  Keep your sodium intake below 2,300 mg per day.  Getting at least 30-45 minutes of aerobic exercise at least 4 times per week.  Losing weight if necessary.  Not smoking.  Limiting alcoholic beverages.  Learning ways to reduce stress. Your health care provider may prescribe medicine if lifestyle changes are not enough to get your blood pressure under control, and if one of the following is true:  You are 18-59 years of age and your systolic blood pressure is above 140.  You are 60 years of age or older, and your systolic blood pressure is above 150.  Your diastolic blood pressure is above 90.  You have diabetes, and your systolic blood pressure is over 140 or your diastolic blood pressure is over 90.  You have kidney disease and your blood pressure is above 140/90.  You have heart disease and your blood pressure is above 140/90. Your personal target blood pressure may vary depending on your medical conditions, your age, and other factors. HOME CARE INSTRUCTIONS    Have your blood pressure rechecked as directed by your health care provider.   Take medicines only as directed by your health care provider. Follow the directions carefully. Blood pressure medicines must be taken as prescribed. The medicine does not work as well when you skip doses. Skipping doses also puts you at risk for  problems.  Do not smoke.   Monitor your blood pressure at home as directed by your health care provider. SEEK MEDICAL CARE IF:   You think you are having a reaction to medicines taken.  You have recurrent headaches or feel dizzy.  You have swelling in your ankles.  You have trouble with your vision. SEEK IMMEDIATE MEDICAL CARE IF:  You develop a severe headache or confusion.  You have unusual weakness, numbness, or feel faint.  You have severe chest or abdominal pain.  You vomit repeatedly.  You have trouble breathing. MAKE SURE YOU:   Understand these instructions.  Will watch your condition.  Will get help right away if you are not doing well or get worse.   This information is not intended to replace advice given to you by your health care provider. Make sure you discuss any questions you have with your health care provider.   Document Released: 07/26/2005 Document Revised: 12/10/2014 Document Reviewed: 05/18/2013 Elsevier Interactive Patient Education 2016 [employer]  

## 2015-09-11 NOTE — Progress Notes (Signed)
Subjective:  Patient ID: Emily [name], female    DOB: 09/12/1965  Age: 50 y.o. MRN: [id number]  CC: Hypertension and Anemia   HPI Emily [name] presents for follow-up-she has not been compliant with her antihypertensive medications or her iron replacement therapy. She complains of persistent neck pain that is unchanged. She describes a burning and throbbing sensation in her neck that does not radiate towards her arms. She is currently getting moderate symptom relief with methadone. She has tried gabapentin before for pain and says it helped, she wants to know if she can try gabapentin again.  Outpatient Prescriptions Prior to Visit  Medication Sig Dispense Refill  . Cholecalciferol 50000 UNITS TABS Take 1 tablet by mouth once a week. 12 tablet 3  . folic acid (FOLVITE) 1 MG tablet Take 1 tablet (1 mg total) by mouth daily. 90 tablet 3  . loperamide (IMODIUM A-D) 2 MG tablet Take 2 mg by mouth as needed.      . Olmesartan-Amlodipine-HCTZ (TRIBENZOR) 40-10-25 MG TABS Take 1 tablet by mouth daily. 30 tablet 11  . potassium chloride SA (K-DUR,KLOR-CON) 20 MEQ tablet Take 1 tablet (20 mEq total) by mouth 3 (three) times daily. 270 tablet 1  . sertraline (ZOLOFT) 100 MG tablet TAKE 1 TABLET IN THE MORNING AND 1/2 TAB IN THE EVENING. 45 tablet 2  . Fe Cbn-Fe Gluc-FA-B12-C-DSS (FERRALET 90) 90-1 MG TABS Take 1 tablet by mouth daily. 90 each 3  . methadone (DOLOPHINE) 10 MG tablet Take a total of 50 mg [5 tablets] TID 450 tablet 0   No facility-administered medications prior to visit.    ROS Review of Systems  Constitutional: Negative.  Negative for fever, chills, diaphoresis, appetite change and fatigue.  HENT: Negative.   Eyes: Negative.   Respiratory: Negative.  Negative for cough, choking, chest tightness, shortness of breath and stridor.   Cardiovascular: Negative.  Negative for chest pain, palpitations and leg swelling.  Gastrointestinal: Negative.  Negative for nausea,  vomiting, abdominal pain, diarrhea, constipation and blood in stool.  Endocrine: Negative.   Genitourinary: Negative.   Musculoskeletal: Positive for neck pain. Negative for myalgias, back pain, arthralgias and neck stiffness.  Skin: Negative.  Negative for color change and rash.  Allergic/Immunologic: Negative.   Neurological: Negative.  Negative for dizziness, syncope, speech difficulty, weakness, light-headedness, numbness and headaches.  Hematological: Negative.  Negative for adenopathy. Does not bruise/bleed easily.  Psychiatric/Behavioral: Negative.  Negative for suicidal ideas, sleep disturbance, dysphoric mood, decreased concentration and agitation. The patient is not nervous/anxious.     Objective:  BP 160/100 mmHg  Pulse 88  Temp(Src) 98 F (36.7 C) (Oral)  Resp 16  Ht 5' (1.524 m)  Wt 157 lb (71.215 kg)  BMI 30.66 kg/m2  SpO2 99%  BP Readings from Last 3 Encounters:  09/11/15 160/100  04/24/15 146/84  02/20/15 152/94    Wt Readings from Last 3 Encounters:  09/11/15 157 lb (71.215 kg)  04/24/15 158 lb (71.668 kg)  02/20/15 158 lb (71.668 kg)    Physical Exam  Constitutional: She is oriented to person, place, and time.  Non-toxic appearance. She does not have a sickly appearance. She does not appear ill. No distress.  HENT:  Head: Normocephalic and atraumatic.  Mouth/Throat: Oropharynx is clear and moist. No oropharyngeal exudate.  Eyes: Conjunctivae are normal. Right eye exhibits no discharge. Left eye exhibits no discharge. No scleral icterus.  Neck: Normal range of motion. Neck supple. No JVD present. No tracheal deviation present.  No thyromegaly present.  Cardiovascular: Normal rate, regular rhythm, normal heart sounds and intact distal pulses.  Exam reveals no gallop and no friction rub.   No murmur heard. Pulmonary/Chest: Effort normal and breath sounds normal. No stridor. No respiratory distress. She has no wheezes. She has no rales. She exhibits no  tenderness.  Abdominal: Soft. Bowel sounds are normal. She exhibits no distension and no mass. There is no tenderness. There is no rebound and no guarding.  Musculoskeletal: Normal range of motion. She exhibits no edema or tenderness.  Lymphadenopathy:    She has no cervical adenopathy.  Neurological: She is alert and oriented to person, place, and time. She has normal strength. She displays no atrophy, no tremor and normal reflexes. No cranial nerve deficit or sensory deficit. She exhibits normal muscle tone. She displays no seizure activity. Coordination and gait normal. She displays no Babinski's sign on the right side. She displays no Babinski's sign on the left side.  Reflex Scores:      Tricep reflexes are 1+ on the right side and 1+ on the left side.      Bicep reflexes are 1+ on the right side and 1+ on the left side.      Brachioradialis reflexes are 1+ on the right side and 1+ on the left side.      Patellar reflexes are 1+ on the right side and 1+ on the left side.      Achilles reflexes are 1+ on the right side and 1+ on the left side. Skin: Skin is warm and dry. No rash noted. She is not diaphoretic. No erythema. No pallor.  Psychiatric: She has a normal mood and affect. Her behavior is normal. Judgment and thought content normal.  Vitals reviewed.   Lab Results  Component Value Date   WBC 9.6 09/11/2015   HGB 9.2* 09/11/2015   HCT 30.3* 09/11/2015   PLT 316.0 09/11/2015   GLUCOSE 101* 09/11/2015   CHOL 137 12/23/2014   TRIG 309.0* 12/23/2014   HDL 29.00* 12/23/2014   LDLDIRECT 74.0 12/23/2014   LDLCALC 56 12/17/2013   ALT 5 12/23/2014   AST 19 12/23/2014   NA 134* 09/11/2015   K 3.5 09/11/2015   CL 97 09/11/2015   CREATININE 0.84 09/11/2015   BUN 9 09/11/2015   CO2 28 09/11/2015   TSH 2.10 12/23/2014   INR 1.0 04/23/2008    No results found.  Assessment & Plan:   Emily [name] was seen today for hypertension and anemia.  Diagnoses and all orders for this  visit:  Chronic pain syndrome -     Discontinue: methadone (DOLOPHINE) 10 MG tablet; Take a total of 50 mg [5 tablets] TID -     Drugs of abuse screen w/o alc, rtn urine-sln; Future -     Discontinue: methadone (DOLOPHINE) 10 MG tablet; Take a total of 50 mg [5 tablets] TID -     methadone (DOLOPHINE) 10 MG tablet; Take a total of 50 mg [5 tablets] TID -     gabapentin (NEURONTIN) 100 MG capsule; Take 1 capsule (100 mg total) by mouth 3 (three) times daily.  Cervical spinal stenosis- I will continue methadone at the current dose, her urine drug screen today is consistent with her listed medications, will also add on gabapentin for additional symptom relief. -     Discontinue: methadone (DOLOPHINE) 10 MG tablet; Take a total of 50 mg [5 tablets] TID -     Discontinue: methadone (DOLOPHINE) 10 MG  tablet; Take a total of 50 mg [5 tablets] TID -     methadone (DOLOPHINE) 10 MG tablet; Take a total of 50 mg [5 tablets] TID -     gabapentin (NEURONTIN) 100 MG capsule; Take 1 capsule (100 mg total) by mouth 3 (three) times daily.  Hypokalemia- her potassium level is only 3.5, I've asked her to restart potassium replacement therapy. -     Basic metabolic panel; Future  Anemia, iron deficiency- her anemia has worsened and her iron level is low, will restart iron replacement therapy, I've asked her to see gastroenterology to consider having endoscopies done to rule out gastrointestinal sources of blood loss. -     Ambulatory referral to Gastroenterology -     Fe Cbn-Fe Gluc-FA-B12-C-DSS (FERRALET 90) 90-1 MG TABS; Take 1 tablet by mouth daily. -     CBC with Differential/Platelet; Future -     IBC panel; Future -     Ferritin; Future  Essential hypertension- her blood pressure is not well controlled due to noncompliance, I've asked her to restart Tribenzor, her electrolytes and renal function are stable today. -     Urinalysis, Routine w reflex microscopic (not at [facility]); Future -     Drugs of abuse  screen w/o alc, rtn urine-sln; Future -     Basic metabolic panel; Future  Folate deficiency anemia- folate level is normal -     Fe Cbn-Fe Gluc-FA-B12-C-DSS (FERRALET 90) 90-1 MG TABS; Take 1 tablet by mouth daily. -     CBC with Differential/Platelet; Future -     Folate; Future   I am having Ms. Gilbert start on gabapentin. I am also having her maintain her loperamide, sertraline, Cholecalciferol, folic acid, Olmesartan-Amlodipine-HCTZ, potassium chloride SA, FERRALET 90, and methadone.  Meds ordered this encounter  Medications  . DISCONTD: methadone (DOLOPHINE) 10 MG tablet    Sig: Take a total of 50 mg [5 tablets] TID    Dispense:  450 tablet    Refill:  0    Fill on/after 09/11/2015  . Fe Cbn-Fe Gluc-FA-B12-C-DSS (FERRALET 90) 90-1 MG TABS    Sig: Take 1 tablet by mouth daily.    Dispense:  90 each    Refill:  3  . DISCONTD: methadone (DOLOPHINE) 10 MG tablet    Sig: Take a total of 50 mg [5 tablets] TID    Dispense:  450 tablet    Refill:  0    Fill on/after 10/09/2015  . methadone (DOLOPHINE) 10 MG tablet    Sig: Take a total of 50 mg [5 tablets] TID    Dispense:  450 tablet    Refill:  0    Fill on/after 11/09/2015  . gabapentin (NEURONTIN) 100 MG capsule    Sig: Take 1 capsule (100 mg total) by mouth 3 (three) times daily.    Dispense:  90 capsule    Refill:  5     Follow-up: Return in about 3 months (around 12/09/2015).  Sanda Linger, MD

## 2015-09-11 NOTE — Progress Notes (Signed)
Pre visit review using our clinic review tool, if applicable. No additional management support is needed unless otherwise documented below in the visit note. 

## 2015-09-12 ENCOUNTER — Encounter: Payer: Self-pay | Admitting: Internal Medicine

## 2015-09-12 LAB — DRUGS OF ABUSE SCREEN W/O ALC, ROUTINE URINE
Amphetamine Screen, Ur: NEGATIVE
BARBITURATE QUANT UR: NEGATIVE
Benzodiazepines.: NEGATIVE
COCAINE METABOLITES: NEGATIVE
Creatinine,U: 44.8 mg/dL
METHADONE: POSITIVE — AB
Marijuana Metabolite: NEGATIVE
OPIATE SCREEN, URINE: NEGATIVE
PROPOXYPHENE: NEGATIVE
Phencyclidine (PCP): NEGATIVE

## 2015-09-12 MED ORDER — POTASSIUM CHLORIDE CRYS ER 20 MEQ PO TBCR: 20.0000 meq | EXTENDED_RELEASE_TABLET | Freq: Three times a day (TID) | ORAL | Status: DC

## 2015-09-12 MED ORDER — GABAPENTIN 100 MG PO CAPS: 100.0000 mg | ORAL_CAPSULE | Freq: Three times a day (TID) | ORAL | Status: DC

## 2015-09-16 LAB — METHADONE (GC/LC/MS), URINE
EDDPUC: 12424 ng/mL — AB (ref <100)
Methadone (GC/LC/MS), ur confirm: 10973 ng/mL — ABNORMAL HIGH (ref <100)

## 2015-12-29 ENCOUNTER — Ambulatory Visit: Payer: Self-pay | Admitting: Internal Medicine

## 2016-01-01 ENCOUNTER — Ambulatory Visit (INDEPENDENT_AMBULATORY_CARE_PROVIDER_SITE_OTHER): Payer: PRIVATE HEALTH INSURANCE | Admitting: Internal Medicine

## 2016-01-01 ENCOUNTER — Other Ambulatory Visit (INDEPENDENT_AMBULATORY_CARE_PROVIDER_SITE_OTHER): Payer: PRIVATE HEALTH INSURANCE

## 2016-01-01 ENCOUNTER — Encounter: Payer: Self-pay | Admitting: Internal Medicine

## 2016-01-01 VITALS — BP 164/102 | HR 90 | Temp 97.9°F | Resp 16 | Ht 60.0 in | Wt 156.0 lb

## 2016-01-01 DIAGNOSIS — E876 Hypokalemia: Secondary | ICD-10-CM

## 2016-01-01 DIAGNOSIS — E781 Pure hyperglyceridemia: Secondary | ICD-10-CM | POA: Diagnosis not present

## 2016-01-01 DIAGNOSIS — I10 Essential (primary) hypertension: Secondary | ICD-10-CM

## 2016-01-01 DIAGNOSIS — D529 Folate deficiency anemia, unspecified: Secondary | ICD-10-CM | POA: Diagnosis not present

## 2016-01-01 DIAGNOSIS — G894 Chronic pain syndrome: Secondary | ICD-10-CM

## 2016-01-01 DIAGNOSIS — M4802 Spinal stenosis, cervical region: Secondary | ICD-10-CM

## 2016-01-01 DIAGNOSIS — D509 Iron deficiency anemia, unspecified: Secondary | ICD-10-CM

## 2016-01-01 DIAGNOSIS — E785 Hyperlipidemia, unspecified: Secondary | ICD-10-CM

## 2016-01-01 DIAGNOSIS — Z1211 Encounter for screening for malignant neoplasm of colon: Secondary | ICD-10-CM

## 2016-01-01 LAB — URINALYSIS, ROUTINE W REFLEX MICROSCOPIC
BILIRUBIN URINE: NEGATIVE
KETONES UR: NEGATIVE
LEUKOCYTES UA: NEGATIVE
NITRITE: NEGATIVE
PH: 6 (ref 5.0–8.0)
Specific Gravity, Urine: 1.01 (ref 1.000–1.030)
Total Protein, Urine: NEGATIVE
UROBILINOGEN UA: 0.2 (ref 0.0–1.0)
Urine Glucose: NEGATIVE
WBC, UA: NONE SEEN (ref 0–?)

## 2016-01-01 LAB — LIPID PANEL
CHOL/HDL RATIO: 7
Cholesterol: 178 mg/dL (ref 0–200)
HDL: 25.1 mg/dL — AB (ref >39.00)
NONHDL: 153.15
Triglycerides: 333 mg/dL — ABNORMAL HIGH (ref 0.0–149.0)
VLDL: 66.6 mg/dL — AB (ref 0.0–40.0)

## 2016-01-01 LAB — BASIC METABOLIC PANEL
BUN: 9 mg/dL (ref 6–23)
CO2: 32 mEq/L (ref 19–32)
Calcium: 9.4 mg/dL (ref 8.4–10.5)
Chloride: 100 mEq/L (ref 96–112)
Creatinine, Ser: 1.05 mg/dL (ref 0.40–1.20)
GFR: 58.7 mL/min — AB (ref >60.00)
GLUCOSE: 92 mg/dL (ref 70–99)
POTASSIUM: 3.5 meq/L (ref 3.5–5.1)
SODIUM: 137 meq/L (ref 135–145)

## 2016-01-01 LAB — CBC WITH DIFFERENTIAL/PLATELET
Basophils Absolute: 0 10*3/uL (ref 0.0–0.1)
Basophils Relative: 0.4 % (ref 0.0–3.0)
Eosinophils Absolute: 0.1 10*3/uL (ref 0.0–0.7)
Eosinophils Relative: 1.3 % (ref 0.0–5.0)
HEMATOCRIT: 37.3 % (ref 36.0–46.0)
HEMOGLOBIN: 12 g/dL (ref 12.0–15.0)
LYMPHS PCT: 23.4 % (ref 12.0–46.0)
Lymphs Abs: 2.3 10*3/uL (ref 0.7–4.0)
MCHC: 32.2 g/dL (ref 30.0–36.0)
MCV: 75 fl — AB (ref 78.0–100.0)
MONOS PCT: 7.7 % (ref 3.0–12.0)
Monocytes Absolute: 0.8 10*3/uL (ref 0.1–1.0)
NEUTROS PCT: 67.2 % (ref 43.0–77.0)
Neutro Abs: 6.6 10*3/uL (ref 1.4–7.7)
Platelets: 278 10*3/uL (ref 150.0–400.0)
RBC: 4.98 Mil/uL (ref 3.87–5.11)
RDW: 17.6 % — ABNORMAL HIGH (ref 11.5–15.5)
WBC: 9.8 10*3/uL (ref 4.0–10.5)

## 2016-01-01 LAB — TSH: TSH: 3.59 u[IU]/mL (ref 0.35–4.50)

## 2016-01-01 LAB — LDL CHOLESTEROL, DIRECT: Direct LDL: 101 mg/dL

## 2016-01-01 MED ORDER — NEBIVOLOL HCL 5 MG PO TABS: 5.0000 mg | ORAL_TABLET | Freq: Every day | ORAL | Status: DC

## 2016-01-01 MED ORDER — METHADONE HCL 10 MG PO TABS: ORAL_TABLET | ORAL | Status: DC

## 2016-01-01 NOTE — Progress Notes (Signed)
Pre visit review using our clinic review tool, if applicable. No additional management support is needed unless otherwise documented below in the visit note. 

## 2016-01-01 NOTE — Patient Instructions (Signed)
Hypertension Hypertension, commonly called high blood pressure, is when the force of blood pumping through your arteries is too strong. Your arteries are the blood vessels that carry blood from your heart throughout your body. A blood pressure reading consists of a higher number over a lower number, such as 110/72. The higher number (systolic) is the pressure inside your arteries when your heart pumps. The lower number (diastolic) is the pressure inside your arteries when your heart relaxes. Ideally you want your blood pressure below 120/80. Hypertension forces your heart to work harder to pump blood. Your arteries may become narrow or stiff. Having untreated or uncontrolled hypertension can cause heart attack, stroke, kidney disease, and other problems. RISK FACTORS Some risk factors for high blood pressure are controllable. Others are not.  Risk factors you cannot control include:   Race. You may be at higher risk if you are African American.  Age. Risk increases with age.  Gender. Men are at higher risk than women before age 45 years. After age 65, women are at higher risk than men. Risk factors you can control include:  Not getting enough exercise or physical activity.  Being overweight.  Getting too much fat, sugar, calories, or salt in your diet.  Drinking too much alcohol. SIGNS AND SYMPTOMS Hypertension does not usually cause signs or symptoms. Extremely high blood pressure (hypertensive crisis) may cause headache, anxiety, shortness of breath, and nosebleed. DIAGNOSIS To check if you have hypertension, your health care provider will measure your blood pressure while you are seated, with your arm held at the level of your heart. It should be measured at least twice using the same arm. Certain conditions can cause a difference in blood pressure between your right and left arms. A blood pressure reading that is higher than normal on one occasion does not mean that you need treatment. If  it is not clear whether you have high blood pressure, you may be asked to return on a different day to have your blood pressure checked again. Or, you may be asked to monitor your blood pressure at home for 1 or more weeks. TREATMENT Treating high blood pressure includes making lifestyle changes and possibly taking medicine. Living a healthy lifestyle can help lower high blood pressure. You may need to change some of your habits. Lifestyle changes may include:  Following the DASH diet. This diet is high in fruits, vegetables, and whole grains. It is low in salt, red meat, and added sugars.  Keep your sodium intake below 2,300 mg per day.  Getting at least 30-45 minutes of aerobic exercise at least 4 times per week.  Losing weight if necessary.  Not smoking.  Limiting alcoholic beverages.  Learning ways to reduce stress. Your health care provider may prescribe medicine if lifestyle changes are not enough to get your blood pressure under control, and if one of the following is true:  You are 18-59 years of age and your systolic blood pressure is above 140.  You are 60 years of age or older, and your systolic blood pressure is above 150.  Your diastolic blood pressure is above 90.  You have diabetes, and your systolic blood pressure is over 140 or your diastolic blood pressure is over 90.  You have kidney disease and your blood pressure is above 140/90.  You have heart disease and your blood pressure is above 140/90. Your personal target blood pressure may vary depending on your medical conditions, your age, and other factors. HOME CARE INSTRUCTIONS    Have your blood pressure rechecked as directed by your health care provider.   Take medicines only as directed by your health care provider. Follow the directions carefully. Blood pressure medicines must be taken as prescribed. The medicine does not work as well when you skip doses. Skipping doses also puts you at risk for  problems.  Do not smoke.   Monitor your blood pressure at home as directed by your health care provider. SEEK MEDICAL CARE IF:   You think you are having a reaction to medicines taken.  You have recurrent headaches or feel dizzy.  You have swelling in your ankles.  You have trouble with your vision. SEEK IMMEDIATE MEDICAL CARE IF:  You develop a severe headache or confusion.  You have unusual weakness, numbness, or feel faint.  You have severe chest or abdominal pain.  You vomit repeatedly.  You have trouble breathing. MAKE SURE YOU:   Understand these instructions.  Will watch your condition.  Will get help right away if you are not doing well or get worse.   This information is not intended to replace advice given to you by your health care provider. Make sure you discuss any questions you have with your health care provider.   Document Released: 07/26/2005 Document Revised: 12/10/2014 Document Reviewed: 05/18/2013 Elsevier Interactive Patient Education 2016 [employer]  

## 2016-01-01 NOTE — Progress Notes (Signed)
Subjective:  Patient ID: Emily [name], female    DOB: 07-12-1966  Age: 50 y.o. MRN: [id number]  CC: Follow-up; Hypertension; and Anemia   HPI Emily [name] presents for follow-up on hypertension, anemia and chronic neck pain. She tells me that her current dose of methadone a is adequate control for neck pain. She has had no worsening of the discomfort in her neck and she denies any radiation of the neck pain or paresthesias in her arms or legs.  Her blood pressure is not well controlled, she tells me that she has not taken Tribenzor for the last 2-3 days. She tells me she just forgets. It is not too expensive for her as she tells me that she gets samples from a family member who is a physician. She tells me it was not unusual for her to go for 2-3 days without taking it. She denies headache, blurred vision, chest pain, shortness of breath, or edema.  She has iron deficiency anemia and is being treated with oral iron replacement therapy. She is due for a .framcolonoscopy. She tells me that her symptoms improved since she has been more compliant with the iron replacement therapy. She denies any recent episodes of blood loss, shortness of breath, fatigue, or easy bruising.  Outpatient Prescriptions Prior to Visit  Medication Sig Dispense Refill  . Cholecalciferol 50000 UNITS TABS Take 1 tablet by mouth once a week. 12 tablet 3  . Fe Cbn-Fe Gluc-FA-B12-C-DSS (FERRALET 90) 90-1 MG TABS Take 1 tablet by mouth daily. 90 each 3  . folic acid (FOLVITE) 1 MG tablet Take 1 tablet (1 mg total) by mouth daily. 90 tablet 3  . gabapentin (NEURONTIN) 100 MG capsule Take 1 capsule (100 mg total) by mouth 3 (three) times daily. 90 capsule 5  . loperamide (IMODIUM A-D) 2 MG tablet Take 2 mg by mouth as needed.      . Olmesartan-Amlodipine-HCTZ (TRIBENZOR) 40-10-25 MG TABS Take 1 tablet by mouth daily. 30 tablet 11  . potassium chloride SA (K-DUR,KLOR-CON) 20 MEQ tablet Take 1 tablet (20 mEq total) by  mouth 3 (three) times daily. 270 tablet 1  . sertraline (ZOLOFT) 100 MG tablet TAKE 1 TABLET IN THE MORNING AND 1/2 TAB IN THE EVENING. 45 tablet 2  . methadone (DOLOPHINE) 10 MG tablet Take a total of 50 mg [5 tablets] TID 450 tablet 0   No facility-administered medications prior to visit.    ROS Review of Systems  Constitutional: Negative.  Negative for fever, chills, diaphoresis, appetite change and fatigue.  HENT: Negative.   Eyes: Negative.  Negative for visual disturbance.  Respiratory: Negative.  Negative for cough, choking, chest tightness, shortness of breath and stridor.   Cardiovascular: Negative.  Negative for chest pain, palpitations and leg swelling.  Gastrointestinal: Negative.  Negative for nausea, vomiting, abdominal pain, diarrhea, constipation and blood in stool.  Endocrine: Negative.   Genitourinary: Negative for dysuria, urgency, frequency, hematuria, flank pain, decreased urine volume, vaginal bleeding and difficulty urinating.  Musculoskeletal: Positive for neck pain. Negative for myalgias, back pain, arthralgias and neck stiffness.  Skin: Negative.  Negative for color change and rash.  Allergic/Immunologic: Negative.   Neurological: Negative.  Negative for dizziness, tremors, syncope, light-headedness, numbness and headaches.  Hematological: Negative.  Negative for adenopathy. Does not bruise/bleed easily.  Psychiatric/Behavioral: Negative.     Objective:  BP 164/102 mmHg  Pulse 90  Temp(Src) 97.9 F (36.6 C) (Oral)  Resp 16  Ht 5' (1.524 m)  Wt  156 lb (70.761 kg)  BMI 30.47 kg/m2  SpO2 96%  BP Readings from Last 3 Encounters:  01/01/16 164/102  09/11/15 160/100  04/24/15 146/84    Wt Readings from Last 3 Encounters:  01/01/16 156 lb (70.761 kg)  09/11/15 157 lb (71.215 kg)  04/24/15 158 lb (71.668 kg)    Physical Exam  Constitutional: She is oriented to person, place, and time. No distress.  HENT:  Head: Normocephalic and atraumatic.    Mouth/Throat: Oropharynx is clear and moist. No oropharyngeal exudate.  Eyes: Conjunctivae are normal. Right eye exhibits no discharge. Left eye exhibits no discharge. No scleral icterus.  Neck: Normal range of motion. Neck supple. No JVD present. No tracheal deviation present. No thyromegaly present.  Cardiovascular: Normal rate, regular rhythm, normal heart sounds and intact distal pulses.  Exam reveals no gallop and no friction rub.   No murmur heard. Pulmonary/Chest: Effort normal and breath sounds normal. No stridor. No respiratory distress. She has no wheezes. She has no rales. She exhibits no tenderness.  Abdominal: Soft. Bowel sounds are normal. She exhibits no distension and no mass. There is no tenderness. There is no rebound and no guarding.  Musculoskeletal: Normal range of motion. She exhibits no edema or tenderness.  Lymphadenopathy:    She has no cervical adenopathy.  Neurological: She is oriented to person, place, and time.  Skin: Skin is warm and dry. No rash noted. She is not diaphoretic. No erythema. No pallor.  Vitals reviewed.   Lab Results  Component Value Date   WBC 9.8 01/01/2016   HGB 12.0 01/01/2016   HCT 37.3 01/01/2016   PLT 278.0 01/01/2016   GLUCOSE 92 01/01/2016   CHOL 178 01/01/2016   TRIG 333.0* 01/01/2016   HDL 25.10* 01/01/2016   LDLDIRECT 101.0 01/01/2016   LDLCALC 56 12/17/2013   ALT 5 12/23/2014   AST 19 12/23/2014   NA 137 01/01/2016   K 3.5 01/01/2016   CL 100 01/01/2016   CREATININE 1.05 01/01/2016   BUN 9 01/01/2016   CO2 32 01/01/2016   TSH 3.59 01/01/2016   INR 1.0 04/23/2008    No results found.  Assessment & Plan:   Emily [name] was seen today for follow-up, hypertension and anemia.  Diagnoses and all orders for this visit:  Anemia, iron deficiency- Improvement noted, I've asked her to continue her iron replacement therapy -     CBC with Differential/Platelet; Future -     Ambulatory referral to  Gastroenterology  Hypertriglyceridemia- triglycerides are mildly elevated but do not warrant pharmacological treatment at this time, she agrees to have her triglycerides repeated in the fasting state and to work on her lifestyle modifications -     Lipid panel; Future  Hypokalemia- this is stable -     Basic metabolic panel; Future  Essential hypertension- her blood pressure is not well controlled, partly due to noncompliance, she has blood in her urine and a decline in her renal function today, I've asked her to restart Tribenzor as directed and will also add nebivolol. -     nebivolol (BYSTOLIC) 5 MG tablet; Take 1 tablet (5 mg total) by mouth daily. -     Basic metabolic panel; Future -     TSH; Future -     Urinalysis, [facility] w reflex microscopic ([facility]); Future  Folate deficiency anemia- improvement noted -     CBC with Differential/Platelet; Future  Cervical spinal stenosis -     Discontinue: methadone (DOLOPHINE) 10 MG  tablet; Take a total of 50 mg [5 tablets] TID -     Discontinue: methadone (DOLOPHINE) 10 MG tablet; Take a total of 50 mg [5 tablets] TID -     methadone (DOLOPHINE) 10 MG tablet; Take a total of 50 mg [5 tablets] TID  Chronic pain syndrome -     Discontinue: methadone (DOLOPHINE) 10 MG tablet; Take a total of 50 mg [5 tablets] TID -     Discontinue: methadone (DOLOPHINE) 10 MG tablet; Take a total of 50 mg [5 tablets] TID -     methadone (DOLOPHINE) 10 MG tablet; Take a total of 50 mg [5 tablets] TID  Hyperlipidemia with target LDL less than 70- her Framingham risk score is 11% so I've asked her to start statin therapy to reduce risk of heart attack and stroke. -     atorvastatin (LIPITOR) 10 MG tablet; Take 1 tablet (10 mg total) by mouth daily.  Screen for colon cancer -     Ambulatory referral to Gastroenterology   I am having Ms. Gilbert start on nebivolol and atorvastatin. I am also having her maintain her loperamide, sertraline, Cholecalciferol,  folic acid, Olmesartan-Amlodipine-HCTZ, FERRALET 90, gabapentin, potassium chloride SA, and methadone.  Meds ordered this encounter  Medications  . nebivolol (BYSTOLIC) 5 MG tablet    Sig: Take 1 tablet (5 mg total) by mouth daily.    Dispense:  30 tablet    Refill:  5  . DISCONTD: methadone (DOLOPHINE) 10 MG tablet    Sig: Take a total of 50 mg [5 tablets] TID    Dispense:  450 tablet    Refill:  0    Fill on/after 01/01/2016  . DISCONTD: methadone (DOLOPHINE) 10 MG tablet    Sig: Take a total of 50 mg [5 tablets] TID    Dispense:  450 tablet    Refill:  0    Fill on/after 02/01/2016  . methadone (DOLOPHINE) 10 MG tablet    Sig: Take a total of 50 mg [5 tablets] TID    Dispense:  450 tablet    Refill:  0    Fill on/after 03/02/2016  . atorvastatin (LIPITOR) 10 MG tablet    Sig: Take 1 tablet (10 mg total) by mouth daily.    Dispense:  90 tablet    Refill:  3     Follow-up: Return in about 6 weeks (around 02/12/2016).  Sanda Linger, MD

## 2016-01-02 ENCOUNTER — Encounter: Payer: Self-pay | Admitting: Internal Medicine

## 2016-01-02 DIAGNOSIS — E785 Hyperlipidemia, unspecified: Secondary | ICD-10-CM | POA: Insufficient documentation

## 2016-01-02 MED ORDER — ATORVASTATIN CALCIUM 10 MG PO TABS: 10.0000 mg | ORAL_TABLET | Freq: Every day | ORAL | Status: DC

## 2016-01-04 DIAGNOSIS — Z114 Encounter for screening for human immunodeficiency virus [HIV]: Secondary | ICD-10-CM | POA: Insufficient documentation

## 2016-01-06 ENCOUNTER — Encounter: Payer: Self-pay | Admitting: Internal Medicine

## 2016-01-13 ENCOUNTER — Encounter: Payer: Self-pay | Admitting: Internal Medicine

## 2016-02-06 ENCOUNTER — Encounter: Payer: Self-pay | Admitting: Internal Medicine

## 2016-02-16 ENCOUNTER — Telehealth: Payer: Self-pay

## 2016-02-16 NOTE — Telephone Encounter (Signed)
[facility] in [place] called and wanted verification that rx for methadone was okay to fill.   Gave okay per previous message from pt.

## 2016-05-06 ENCOUNTER — Ambulatory Visit: Payer: Self-pay | Admitting: Internal Medicine

## 2016-05-10 ENCOUNTER — Ambulatory Visit: Payer: Self-pay | Admitting: Internal Medicine

## 2016-05-17 ENCOUNTER — Ambulatory Visit: Payer: Self-pay | Admitting: Internal Medicine

## 2016-05-20 ENCOUNTER — Encounter: Payer: Self-pay | Admitting: Internal Medicine

## 2016-05-20 ENCOUNTER — Ambulatory Visit (INDEPENDENT_AMBULATORY_CARE_PROVIDER_SITE_OTHER): Payer: PRIVATE HEALTH INSURANCE | Admitting: Internal Medicine

## 2016-05-20 VITALS — BP 220/130 | HR 109 | Temp 98.3°F | Ht 60.0 in | Wt 147.2 lb

## 2016-05-20 DIAGNOSIS — G894 Chronic pain syndrome: Secondary | ICD-10-CM

## 2016-05-20 DIAGNOSIS — I517 Cardiomegaly: Secondary | ICD-10-CM

## 2016-05-20 DIAGNOSIS — I10 Essential (primary) hypertension: Secondary | ICD-10-CM | POA: Diagnosis not present

## 2016-05-20 DIAGNOSIS — E876 Hypokalemia: Secondary | ICD-10-CM

## 2016-05-20 DIAGNOSIS — M4802 Spinal stenosis, cervical region: Secondary | ICD-10-CM

## 2016-05-20 DIAGNOSIS — Z23 Encounter for immunization: Secondary | ICD-10-CM | POA: Diagnosis not present

## 2016-05-20 MED ORDER — NEBIVOLOL HCL 10 MG PO TABS: 10.0000 mg | ORAL_TABLET | Freq: Every day | ORAL | 11 refills | Status: DC

## 2016-05-20 MED ORDER — METHADONE HCL 10 MG PO TABS: ORAL_TABLET | ORAL | 0 refills | Status: DC

## 2016-05-20 MED ORDER — CHLORTHALIDONE 25 MG PO TABS: 25.0000 mg | ORAL_TABLET | Freq: Every day | ORAL | 1 refills | Status: DC

## 2016-05-20 MED ORDER — TELMISARTAN 80 MG PO TABS: 80.0000 mg | ORAL_TABLET | Freq: Every day | ORAL | 1 refills | Status: DC

## 2016-05-20 NOTE — Progress Notes (Signed)
Pre visit review using our clinic review tool, if applicable. No additional management support is needed unless otherwise documented below in the visit note. 

## 2016-05-20 NOTE — Patient Instructions (Signed)
Hypertension Hypertension, commonly called high blood pressure, is when the force of blood pumping through your arteries is too strong. Your arteries are the blood vessels that carry blood from your heart throughout your body. A blood pressure reading consists of a higher number over a lower number, such as 110/72. The higher number (systolic) is the pressure inside your arteries when your heart pumps. The lower number (diastolic) is the pressure inside your arteries when your heart relaxes. Ideally you want your blood pressure below 120/80. Hypertension forces your heart to work harder to pump blood. Your arteries may become narrow or stiff. Having untreated or uncontrolled hypertension can cause heart attack, stroke, kidney disease, and other problems. RISK FACTORS Some risk factors for high blood pressure are controllable. Others are not.  Risk factors you cannot control include:   Race. You may be at higher risk if you are African American.  Age. Risk increases with age.  Gender. Men are at higher risk than women before age 45 years. After age 65, women are at higher risk than men. Risk factors you can control include:  Not getting enough exercise or physical activity.  Being overweight.  Getting too much fat, sugar, calories, or salt in your diet.  Drinking too much alcohol. SIGNS AND SYMPTOMS Hypertension does not usually cause signs or symptoms. Extremely high blood pressure (hypertensive crisis) may cause headache, anxiety, shortness of breath, and nosebleed. DIAGNOSIS To check if you have hypertension, your health care provider will measure your blood pressure while you are seated, with your arm held at the level of your heart. It should be measured at least twice using the same arm. Certain conditions can cause a difference in blood pressure between your right and left arms. A blood pressure reading that is higher than normal on one occasion does not mean that you need treatment. If  it is not clear whether you have high blood pressure, you may be asked to return on a different day to have your blood pressure checked again. Or, you may be asked to monitor your blood pressure at home for 1 or more weeks. TREATMENT Treating high blood pressure includes making lifestyle changes and possibly taking medicine. Living a healthy lifestyle can help lower high blood pressure. You may need to change some of your habits. Lifestyle changes may include:  Following the DASH diet. This diet is high in fruits, vegetables, and whole grains. It is low in salt, red meat, and added sugars.  Keep your sodium intake below 2,300 mg per day.  Getting at least 30-45 minutes of aerobic exercise at least 4 times per week.  Losing weight if necessary.  Not smoking.  Limiting alcoholic beverages.  Learning ways to reduce stress. Your health care provider may prescribe medicine if lifestyle changes are not enough to get your blood pressure under control, and if one of the following is true:  You are 18-59 years of age and your systolic blood pressure is above 140.  You are 60 years of age or older, and your systolic blood pressure is above 150.  Your diastolic blood pressure is above 90.  You have diabetes, and your systolic blood pressure is over 140 or your diastolic blood pressure is over 90.  You have kidney disease and your blood pressure is above 140/90.  You have heart disease and your blood pressure is above 140/90. Your personal target blood pressure may vary depending on your medical conditions, your age, and other factors. HOME CARE INSTRUCTIONS    Have your blood pressure rechecked as directed by your health care provider.   Take medicines only as directed by your health care provider. Follow the directions carefully. Blood pressure medicines must be taken as prescribed. The medicine does not work as well when you skip doses. Skipping doses also puts you at risk for  problems.  Do not smoke.   Monitor your blood pressure at home as directed by your health care provider. SEEK MEDICAL CARE IF:   You think you are having a reaction to medicines taken.  You have recurrent headaches or feel dizzy.  You have swelling in your ankles.  You have trouble with your vision. SEEK IMMEDIATE MEDICAL CARE IF:  You develop a severe headache or confusion.  You have unusual weakness, numbness, or feel faint.  You have severe chest or abdominal pain.  You vomit repeatedly.  You have trouble breathing. MAKE SURE YOU:   Understand these instructions.  Will watch your condition.  Will get help right away if you are not doing well or get worse.   This information is not intended to replace advice given to you by your health care provider. Make sure you discuss any questions you have with your health care provider.   Document Released: 07/26/2005 Document Revised: 12/10/2014 Document Reviewed: 05/18/2013 Elsevier Interactive Patient Education 2016 [employer]  

## 2016-05-20 NOTE — Progress Notes (Signed)
Subjective:  Patient ID: Emily [name], female    DOB: [birth year]  Age: 50 y.o. MRN: [id number]  CC: Hypertension   HPI Emily [name] presents for a blood pressure check. She has not taken any antihypertensives for about a week because she ran out and they were too expensive for her to buy again. She's had a few headaches but she denies blurred vision, chest pain, shortness of breath, diaphoresis, palpitations, edema, or fatigue.   She also needs a refill on methadone which she takes for chronic neck pain. She describes intermittent throbbing and stabbing in her neck that radiates towards her arms. The pain is not worsening or changing and she denies paresthesias in her arms or legs.  Outpatient Medications Prior to Visit  Medication Sig Dispense Refill  . atorvastatin (LIPITOR) 10 MG tablet Take 1 tablet (10 mg total) by mouth daily. 90 tablet 3  . Cholecalciferol 50000 UNITS TABS Take 1 tablet by mouth once a week. 12 tablet 3  . Fe Cbn-Fe Gluc-FA-B12-C-DSS (FERRALET 90) 90-1 MG TABS Take 1 tablet by mouth daily. 90 each 3  . folic acid (FOLVITE) 1 MG tablet Take 1 tablet (1 mg total) by mouth daily. 90 tablet 3  . gabapentin (NEURONTIN) 100 MG capsule Take 1 capsule (100 mg total) by mouth 3 (three) times daily. 90 capsule 5  . loperamide (IMODIUM A-D) 2 MG tablet Take 2 mg by mouth as needed.      . potassium chloride SA (K-DUR,KLOR-CON) 20 MEQ tablet Take 1 tablet (20 mEq total) by mouth 3 (three) times daily. 270 tablet 1  . sertraline (ZOLOFT) 100 MG tablet TAKE 1 TABLET IN THE MORNING AND 1/2 TAB IN THE EVENING. 45 tablet 2  . methadone (DOLOPHINE) 10 MG tablet Take a total of 50 mg [5 tablets] TID 450 tablet 0  . nebivolol (BYSTOLIC) 5 MG tablet Take 1 tablet (5 mg total) by mouth daily. 30 tablet 5  . Olmesartan-Amlodipine-HCTZ (TRIBENZOR) 40-10-25 MG TABS Take 1 tablet by mouth daily. 30 tablet 11   No facility-administered medications prior to visit.      ROS Review of Systems  Constitutional: Negative.  Negative for appetite change, chills, diaphoresis, fatigue and fever.  Eyes: Negative.   Respiratory: Negative.  Negative for cough, choking, chest tightness, shortness of breath and stridor.   Cardiovascular: Negative for chest pain, palpitations and leg swelling.  Gastrointestinal: Negative.  Negative for abdominal pain, constipation, diarrhea, nausea and vomiting.  Endocrine: Negative.   Genitourinary: Negative.   Musculoskeletal: Positive for neck pain. Negative for back pain, gait problem and neck stiffness.  Skin: Negative.   Allergic/Immunologic: Negative.   Neurological: Positive for headaches. Negative for dizziness, tremors, seizures, syncope, facial asymmetry, speech difficulty, weakness, light-headedness and numbness.  Hematological: Negative for adenopathy. Does not bruise/bleed easily.  Psychiatric/Behavioral: Negative.  Negative for behavioral problems, decreased concentration, dysphoric mood, sleep disturbance and suicidal ideas. The patient is not nervous/anxious.     Objective:  BP (!) 220/130 (BP Location: Left Arm, Patient Position: Sitting, Cuff Size: Normal)   Pulse (!) 109   Temp 98.3 F (36.8 C) (Oral)   Ht 5' (1.524 m)   Wt 147 lb 4 oz (66.8 kg)   SpO2 93%   BMI 28.76 kg/m   BP Readings from Last 3 Encounters:  05/20/16 (!) 220/130  01/01/16 (!) 164/102  09/11/15 (!) 160/100    Wt Readings from Last 3 Encounters:  05/20/16 147 lb 4 oz (66.8 kg)  01/01/16 156 lb (70.8 kg)  09/11/15 157 lb (71.2 kg)    Physical Exam  Constitutional: She is oriented to person, place, and time. No distress.  HENT:  Mouth/Throat: Oropharynx is clear and moist. No oropharyngeal exudate.  Eyes: Conjunctivae are normal. Right eye exhibits no discharge. Left eye exhibits no discharge. No scleral icterus.  Neck: Normal range of motion. Neck supple. No JVD present. No tracheal deviation present. No thyromegaly present.   Cardiovascular: Normal rate, regular rhythm, S1 normal, S2 normal, normal heart sounds and intact distal pulses.  Exam reveals no gallop and no friction rub.   No murmur heard. Pulses:      Carotid pulses are 1+ on the right side, and 1+ on the left side.      Radial pulses are 1+ on the right side, and 1+ on the left side.       Femoral pulses are 1+ on the right side, and 1+ on the left side.      Popliteal pulses are 1+ on the right side, and 1+ on the left side.       Dorsalis pedis pulses are 1+ on the right side, and 1+ on the left side.       Posterior tibial pulses are 1+ on the right side, and 1+ on the left side.  EKG ----  Sinus  Rhythm  -Left atrial enlargement.   BORDERLINE  Pulmonary/Chest: Effort normal and breath sounds normal. No stridor. No respiratory distress. She has no wheezes. She has no rales. She exhibits no tenderness.  Abdominal: Soft. Bowel sounds are normal. She exhibits no distension and no mass. There is no tenderness. There is no rebound and no guarding.  Musculoskeletal: Normal range of motion. She exhibits no edema, tenderness or deformity.  Lymphadenopathy:    She has no cervical adenopathy.  Neurological: She is oriented to person, place, and time.  Skin: Skin is warm and dry. No rash noted. She is not diaphoretic. No erythema. No pallor.  Psychiatric: She has a normal mood and affect. Her behavior is normal. Judgment and thought content normal.  Vitals reviewed.   Lab Results  Component Value Date   WBC 9.8 01/01/2016   HGB 12.0 01/01/2016   HCT 37.3 01/01/2016   PLT 278.0 01/01/2016   GLUCOSE 92 01/01/2016   CHOL 178 01/01/2016   TRIG 333.0 (H) 01/01/2016   HDL 25.10 (L) 01/01/2016   LDLDIRECT 101.0 01/01/2016   LDLCALC 56 12/17/2013   ALT 5 12/23/2014   AST 19 12/23/2014   NA 137 01/01/2016   K 3.5 01/01/2016   CL 100 01/01/2016   CREATININE 1.05 01/01/2016   BUN 9 01/01/2016   CO2 32 01/01/2016   TSH 3.59 01/01/2016   INR 1.0  04/23/2008    No results found.  Assessment & Plan:   Sharonne was seen today for hypertension.  Diagnoses and all orders for this visit:  Essential hypertension- her blood pressure is not well controlled, she tells me her previous antihypertensives were too expensive so I've changed to generics. Will restart the beta blocker, ARB, and thiazide diuretic, will add on amlodipine if needed in the future. -     EKG 12-Lead -     telmisartan (MICARDIS) 80 MG tablet; Take 1 tablet (80 mg total) by mouth daily. -     chlorthalidone (HYGROTON) 25 MG tablet; Take 1 tablet (25 mg total) by mouth daily. -     nebivolol (BYSTOLIC) 10 MG tablet; Take 1  tablet (10 mg total) by mouth daily.  Cervical spinal stenosis -     methadone (DOLOPHINE) 10 MG tablet; Take a total of 50 mg [5 tablets] TID  Hypokalemia- this is stable, will continue potassium replacement therapy.  LAE (left atrial enlargement)- she has no symptoms related to this, I've ordered an echocardiogram to screen for valvular heart disease, signs of ischemic heart disease, ventricular hypertrophy. -     ECHOCARDIOGRAM COMPLETE; Future  Chronic pain syndrome -     methadone (DOLOPHINE) 10 MG tablet; Take a total of 50 mg [5 tablets] TID  Need for prophylactic vaccination and inoculation against influenza -     Flu Vaccine QUAD 36+ mos IM   I have discontinued Ms. Elisabeth Cara Olmesartan-Amlodipine-HCTZ and nebivolol. I am also having her start on telmisartan, chlorthalidone, and nebivolol. Additionally, I am having her maintain her loperamide, sertraline, Cholecalciferol, folic acid, FERRALET 90, gabapentin, potassium chloride SA, atorvastatin, and methadone.  Meds ordered this encounter  Medications  . telmisartan (MICARDIS) 80 MG tablet    Sig: Take 1 tablet (80 mg total) by mouth daily.    Dispense:  90 tablet    Refill:  1  . chlorthalidone (HYGROTON) 25 MG tablet    Sig: Take 1 tablet (25 mg total) by mouth daily.     Dispense:  90 tablet    Refill:  1  . nebivolol (BYSTOLIC) 10 MG tablet    Sig: Take 1 tablet (10 mg total) by mouth daily.    Dispense:  30 tablet    Refill:  11  . methadone (DOLOPHINE) 10 MG tablet    Sig: Take a total of 50 mg [5 tablets] TID    Dispense:  450 tablet    Refill:  0    Fill on/after 05/20/2016     Follow-up: Return in about 4 weeks (around 06/17/2016).  Sanda Linger, MD

## 2016-05-24 ENCOUNTER — Ambulatory Visit: Payer: PRIVATE HEALTH INSURANCE | Admitting: Internal Medicine

## 2016-05-28 ENCOUNTER — Telehealth: Payer: Self-pay

## 2016-05-28 NOTE — Telephone Encounter (Signed)
Order [id number]

## 2016-06-21 ENCOUNTER — Other Ambulatory Visit: Payer: Self-pay | Admitting: Internal Medicine

## 2016-06-21 ENCOUNTER — Encounter: Payer: Self-pay | Admitting: Internal Medicine

## 2016-06-21 DIAGNOSIS — M4802 Spinal stenosis, cervical region: Secondary | ICD-10-CM

## 2016-06-21 DIAGNOSIS — G894 Chronic pain syndrome: Secondary | ICD-10-CM

## 2016-06-21 MED ORDER — METHADONE HCL 10 MG PO TABS: ORAL_TABLET | ORAL | 0 refills | Status: DC

## 2016-06-21 NOTE — Telephone Encounter (Signed)
This has been placed in the mail to address of file.

## 2016-06-21 NOTE — Telephone Encounter (Signed)
Found envelope. Filled out new one with address pt has rq rx to be mailed to.

## 2016-06-21 NOTE — Telephone Encounter (Signed)
Rx for Methadone rx written and then mailed. Pls advise.

## 2016-06-24 ENCOUNTER — Ambulatory Visit: Payer: PRIVATE HEALTH INSURANCE | Admitting: Internal Medicine

## 2016-07-09 ENCOUNTER — Telehealth: Payer: Self-pay | Admitting: Internal Medicine

## 2016-07-09 NOTE — Telephone Encounter (Signed)
I have made 3 attempt to schedule patient for Echo.   Called on 10-26, 11/15 and 12/1.   Unable to leave message due to mailbox is full.

## 2016-07-29 ENCOUNTER — Encounter: Payer: Self-pay | Admitting: Internal Medicine

## 2016-07-29 ENCOUNTER — Other Ambulatory Visit (INDEPENDENT_AMBULATORY_CARE_PROVIDER_SITE_OTHER): Payer: PRIVATE HEALTH INSURANCE

## 2016-07-29 ENCOUNTER — Ambulatory Visit (INDEPENDENT_AMBULATORY_CARE_PROVIDER_SITE_OTHER): Payer: PRIVATE HEALTH INSURANCE | Admitting: Internal Medicine

## 2016-07-29 VITALS — BP 160/94 | HR 115 | Temp 98.1°F | Resp 16 | Ht 60.0 in | Wt 146.0 lb

## 2016-07-29 DIAGNOSIS — D508 Other iron deficiency anemias: Secondary | ICD-10-CM

## 2016-07-29 DIAGNOSIS — E781 Pure hyperglyceridemia: Secondary | ICD-10-CM | POA: Diagnosis not present

## 2016-07-29 DIAGNOSIS — E876 Hypokalemia: Secondary | ICD-10-CM

## 2016-07-29 DIAGNOSIS — E785 Hyperlipidemia, unspecified: Secondary | ICD-10-CM

## 2016-07-29 DIAGNOSIS — D520 Dietary folate deficiency anemia: Secondary | ICD-10-CM

## 2016-07-29 DIAGNOSIS — I10 Essential (primary) hypertension: Secondary | ICD-10-CM

## 2016-07-29 DIAGNOSIS — G894 Chronic pain syndrome: Secondary | ICD-10-CM

## 2016-07-29 DIAGNOSIS — M4802 Spinal stenosis, cervical region: Secondary | ICD-10-CM

## 2016-07-29 LAB — CBC WITH DIFFERENTIAL/PLATELET
BASOS PCT: 0.4 % (ref 0.0–3.0)
Basophils Absolute: 0.1 10*3/uL (ref 0.0–0.1)
EOS PCT: 1.1 % (ref 0.0–5.0)
Eosinophils Absolute: 0.2 10*3/uL (ref 0.0–0.7)
HEMATOCRIT: 41 % (ref 36.0–46.0)
Hemoglobin: 13.5 g/dL (ref 12.0–15.0)
LYMPHS PCT: 24.8 % (ref 12.0–46.0)
Lymphs Abs: 3.6 10*3/uL (ref 0.7–4.0)
MCHC: 33 g/dL (ref 30.0–36.0)
MCV: 77.5 fl — AB (ref 78.0–100.0)
MONOS PCT: 6.3 % (ref 3.0–12.0)
Monocytes Absolute: 0.9 10*3/uL (ref 0.1–1.0)
NEUTROS ABS: 9.8 10*3/uL — AB (ref 1.4–7.7)
Neutrophils Relative %: 67.4 % (ref 43.0–77.0)
PLATELETS: 411 10*3/uL — AB (ref 150.0–400.0)
RBC: 5.29 Mil/uL — ABNORMAL HIGH (ref 3.87–5.11)
RDW: 16.7 % — AB (ref 11.5–15.5)
WBC: 14.5 10*3/uL — ABNORMAL HIGH (ref 4.0–10.5)

## 2016-07-29 MED ORDER — METHADONE HCL 10 MG PO TABS: ORAL_TABLET | ORAL | 0 refills | Status: DC

## 2016-07-29 MED ORDER — POTASSIUM CHLORIDE CRYS ER 20 MEQ PO TBCR: 20.0000 meq | EXTENDED_RELEASE_TABLET | Freq: Three times a day (TID) | ORAL | 1 refills | Status: DC

## 2016-07-29 NOTE — Progress Notes (Signed)
Pre visit review using our clinic review tool, if applicable. No additional management support is needed unless otherwise documented below in the visit note. 

## 2016-07-29 NOTE — Patient Instructions (Signed)
Hypertension Hypertension, commonly called high blood pressure, is when the force of blood pumping through your arteries is too strong. Your arteries are the blood vessels that carry blood from your heart throughout your body. A blood pressure reading consists of a higher number over a lower number, such as 110/72. The higher number (systolic) is the pressure inside your arteries when your heart pumps. The lower number (diastolic) is the pressure inside your arteries when your heart relaxes. Ideally you want your blood pressure below 120/80. Hypertension forces your heart to work harder to pump blood. Your arteries may become narrow or stiff. Having untreated or uncontrolled hypertension can cause heart attack, stroke, kidney disease, and other problems. What increases the risk? Some risk factors for high blood pressure are controllable. Others are not. Risk factors you cannot control include:  Race. You may be at higher risk if you are African American.  Age. Risk increases with age.  Gender. Men are at higher risk than women before age 45 years. After age 65, women are at higher risk than men. Risk factors you can control include:  Not getting enough exercise or physical activity.  Being overweight.  Getting too much fat, sugar, calories, or salt in your diet.  Drinking too much alcohol. What are the signs or symptoms? Hypertension does not usually cause signs or symptoms. Extremely high blood pressure (hypertensive crisis) may cause headache, anxiety, shortness of breath, and nosebleed. How is this diagnosed? To check if you have hypertension, your health care provider will measure your blood pressure while you are seated, with your arm held at the level of your heart. It should be measured at least twice using the same arm. Certain conditions can cause a difference in blood pressure between your right and left arms. A blood pressure reading that is higher than normal on one occasion does  not mean that you need treatment. If it is not clear whether you have high blood pressure, you may be asked to return on a different day to have your blood pressure checked again. Or, you may be asked to monitor your blood pressure at home for 1 or more weeks. How is this treated? Treating high blood pressure includes making lifestyle changes and possibly taking medicine. Living a healthy lifestyle can help lower high blood pressure. You may need to change some of your habits. Lifestyle changes may include:  Following the DASH diet. This diet is high in fruits, vegetables, and whole grains. It is low in salt, red meat, and added sugars.  Keep your sodium intake below 2,300 mg per day.  Getting at least 30-45 minutes of aerobic exercise at least 4 times per week.  Losing weight if necessary.  Not smoking.  Limiting alcoholic beverages.  Learning ways to reduce stress. Your health care provider may prescribe medicine if lifestyle changes are not enough to get your blood pressure under control, and if one of the following is true:  You are 18-59 years of age and your systolic blood pressure is above 140.  You are 60 years of age or older, and your systolic blood pressure is above 150.  Your diastolic blood pressure is above 90.  You have diabetes, and your systolic blood pressure is over 140 or your diastolic blood pressure is over 90.  You have kidney disease and your blood pressure is above 140/90.  You have heart disease and your blood pressure is above 140/90. Your personal target blood pressure may vary depending on your medical   conditions, your age, and other factors. Follow these instructions at home:  Have your blood pressure rechecked as directed by your health care provider.  Take medicines only as directed by your health care provider. Follow the directions carefully. Blood pressure medicines must be taken as prescribed. The medicine does not work as well when you skip  doses. Skipping doses also puts you at risk for problems.  Do not smoke.  Monitor your blood pressure at home as directed by your health care provider. Contact a health care provider if:  You think you are having a reaction to medicines taken.  You have recurrent headaches or feel dizzy.  You have swelling in your ankles.  You have trouble with your vision. Get help right away if:  You develop a severe headache or confusion.  You have unusual weakness, numbness, or feel faint.  You have severe chest or abdominal pain.  You vomit repeatedly.  You have trouble breathing. This information is not intended to replace advice given to you by your health care provider. Make sure you discuss any questions you have with your health care provider. Document Released: 07/26/2005 Document Revised: 01/01/2016 Document Reviewed: 05/18/2013 Elsevier Interactive Patient Education  2017 [employer]  

## 2016-07-29 NOTE — Progress Notes (Signed)
Subjective:  Patient ID: Emily [name], female    DOB: 1966-03-15  Age: 50 y.o. MRN: [id number]  CC: Hypertension   HPI Emily [name] presents for blood pressure check and follow-up on chronic neck pain. She is taking methadone and says she gets good symptom relief. Her blood pressure is better but has not been well controlled. She's not been taking Bystolic because she thought it was too expensive. She has had no recent episodes of headache/blurred vision/chest pain/shortness of breath/palpitations/edema/fatigue.  Outpatient Medications Prior to Visit  Medication Sig Dispense Refill  . atorvastatin (LIPITOR) 10 MG tablet Take 1 tablet (10 mg total) by mouth daily. 90 tablet 3  . chlorthalidone (HYGROTON) 25 MG tablet Take 1 tablet (25 mg total) by mouth daily. 90 tablet 1  . Cholecalciferol 50000 UNITS TABS Take 1 tablet by mouth once a week. 12 tablet 3  . Fe Cbn-Fe Gluc-FA-B12-C-DSS (FERRALET 90) 90-1 MG TABS Take 1 tablet by mouth daily. 90 each 3  . folic acid (FOLVITE) 1 MG tablet Take 1 tablet (1 mg total) by mouth daily. 90 tablet 3  . gabapentin (NEURONTIN) 100 MG capsule Take 1 capsule (100 mg total) by mouth 3 (three) times daily. 90 capsule 5  . loperamide (IMODIUM A-D) 2 MG tablet Take 2 mg by mouth as needed.      . sertraline (ZOLOFT) 100 MG tablet TAKE 1 TABLET IN THE MORNING AND 1/2 TAB IN THE EVENING. 45 tablet 2  . telmisartan (MICARDIS) 80 MG tablet Take 1 tablet (80 mg total) by mouth daily. 90 tablet 1  . methadone (DOLOPHINE) 10 MG tablet Take a total of 50 mg [5 tablets] TID 450 tablet 0  . nebivolol (BYSTOLIC) 10 MG tablet Take 1 tablet (10 mg total) by mouth daily. 30 tablet 11  . potassium chloride SA (K-DUR,KLOR-CON) 20 MEQ tablet Take 1 tablet (20 mEq total) by mouth 3 (three) times daily. 270 tablet 1   No facility-administered medications prior to visit.     ROS Review of Systems  Constitutional: Negative for appetite change, diaphoresis and  fatigue.  HENT: Negative.   Eyes: Negative for visual disturbance.  Respiratory: Negative for cough, choking, chest tightness, shortness of breath, wheezing and stridor.   Cardiovascular: Negative for chest pain, palpitations and leg swelling.  Gastrointestinal: Negative for abdominal pain, blood in stool, constipation, diarrhea, nausea and vomiting.  Genitourinary: Negative.  Negative for decreased urine volume, difficulty urinating, frequency and hematuria.  Musculoskeletal: Positive for neck pain. Negative for back pain and neck stiffness.  Skin: Negative.   Neurological: Negative.  Negative for dizziness, weakness, light-headedness, numbness and headaches.  Hematological: Negative for adenopathy. Does not bruise/bleed easily.  Psychiatric/Behavioral: Negative.  Negative for decreased concentration, dysphoric mood, sleep disturbance and suicidal ideas. The patient is not nervous/anxious.     Objective:  BP (!) 160/94 (BP Location: Left Arm, Patient Position: Sitting, Cuff Size: Normal)   Pulse (!) 115   Temp 98.1 F (36.7 C) (Oral)   Resp 16   Ht 5' (1.524 m)   Wt 146 lb (66.2 kg)   SpO2 99%   BMI 28.51 kg/m   BP Readings from Last 3 Encounters:  07/29/16 (!) 160/94  05/20/16 (!) 220/130  01/01/16 (!) 164/102    Wt Readings from Last 3 Encounters:  07/29/16 146 lb (66.2 kg)  05/20/16 147 lb 4 oz (66.8 kg)  01/01/16 156 lb (70.8 kg)    Physical Exam  Constitutional: She is oriented to  person, place, and time. No distress.  HENT:  Mouth/Throat: Oropharynx is clear and moist. No oropharyngeal exudate.  Eyes: Conjunctivae are normal. Right eye exhibits no discharge. Left eye exhibits no discharge. No scleral icterus.  Neck: Normal range of motion. Neck supple. No JVD present. No tracheal deviation present. No thyromegaly present.  Cardiovascular: Regular rhythm, normal heart sounds and intact distal pulses.  Tachycardia present.  Exam reveals no gallop and no friction rub.    No murmur heard. Pulmonary/Chest: Effort normal and breath sounds normal. No stridor. No respiratory distress. She has no wheezes. She has no rales. She exhibits no tenderness.  Abdominal: Bowel sounds are normal. She exhibits no distension and no mass. There is no tenderness. There is no rebound and no guarding.  Musculoskeletal: Normal range of motion. She exhibits no edema, tenderness or deformity.  Lymphadenopathy:    She has no cervical adenopathy.  Neurological: She is oriented to person, place, and time.  Skin: Skin is warm and dry. No rash noted. She is not diaphoretic. No erythema. No pallor.  Psychiatric: She has a normal mood and affect. Her behavior is normal. Judgment and thought content normal.  Vitals reviewed.   Lab Results  Component Value Date   WBC 14.5 (H) 07/29/2016   HGB 13.5 07/29/2016   HCT 41.0 07/29/2016   PLT 411.0 (H) 07/29/2016   GLUCOSE 129 (H) 07/29/2016   CHOL 172 07/29/2016   TRIG 210.0 (H) 07/29/2016   HDL 38.80 (L) 07/29/2016   LDLDIRECT 100.0 07/29/2016   LDLCALC 56 12/17/2013   ALT 5 12/23/2014   AST 19 12/23/2014   NA 140 07/29/2016   K 3.8 07/29/2016   CL 98 07/29/2016   CREATININE 1.08 07/29/2016   BUN 13 07/29/2016   CO2 34 (H) 07/29/2016   TSH 3.59 01/01/2016   INR 1.0 04/23/2008    No results found.  Assessment & Plan:   Emily [name] was seen today for hypertension.  Diagnoses and all orders for this visit:  Essential hypertension- Her blood pressure is not adequately well controlled, will restart by systolic-I gave her samples. Her electrolytes and renal function are stable. -     potassium chloride SA (K-DUR,KLOR-CON) 20 MEQ tablet; Take 1 tablet (20 mEq total) by mouth 3 (three) times daily. -     Basic metabolic panel; Future -     nebivolol (BYSTOLIC) 10 MG tablet; Take 1 tablet (10 mg total) by mouth daily.  Other iron deficiency anemia- improvement noted, her white blood cell count is elevated due to tobacco abuse. -      CBC with Differential/Platelet; Future  Dietary folate deficiency anemia- improvement noted -     CBC with Differential/Platelet; Future  Hypokalemia- her potassium level is in the normal range now, will continue potassium replacement therapy. -     potassium chloride SA (K-DUR,KLOR-CON) 20 MEQ tablet; Take 1 tablet (20 mEq total) by mouth 3 (three) times daily. -     Basic metabolic panel; Future  Hypertriglyceridemia- her triglycerides are mildly elevated but do not require treatment with a medication -     Lipid panel; Future  Hyperlipidemia with target LDL less than 70- she has achieved her LDL goal is doing well on the statin -     Lipid panel; Future  Chronic pain syndrome -     Discontinue: methadone (DOLOPHINE) 10 MG tablet; Take a total of 50 mg [5 tablets] TID -     Discontinue: methadone (DOLOPHINE) 10 MG tablet; Take  a total of 50 mg [5 tablets] TID -     methadone (DOLOPHINE) 10 MG tablet; Take a total of 50 mg [5 tablets] TID  Cervical spinal stenosis -     Discontinue: methadone (DOLOPHINE) 10 MG tablet; Take a total of 50 mg [5 tablets] TID -     Discontinue: methadone (DOLOPHINE) 10 MG tablet; Take a total of 50 mg [5 tablets] TID -     methadone (DOLOPHINE) 10 MG tablet; Take a total of 50 mg [5 tablets] TID   I am having Emily [name] maintain her loperamide, sertraline, Cholecalciferol, folic acid, FERRALET 90, gabapentin, atorvastatin, telmisartan, chlorthalidone, potassium chloride SA, methadone, and nebivolol.  Meds ordered this encounter  Medications  . potassium chloride SA (K-DUR,KLOR-CON) 20 MEQ tablet    Sig: Take 1 tablet (20 mEq total) by mouth 3 (three) times daily.    Dispense:  270 tablet    Refill:  1  . DISCONTD: methadone (DOLOPHINE) 10 MG tablet    Sig: Take a total of 50 mg [5 tablets] TID    Dispense:  450 tablet    Refill:  0    Fill on/after 07/29/2016  . DISCONTD: methadone (DOLOPHINE) 10 MG tablet    Sig: Take a total of 50 mg [5  tablets] TID    Dispense:  450 tablet    Refill:  0    Fill on/after 08/29/2016  . methadone (DOLOPHINE) 10 MG tablet    Sig: Take a total of 50 mg [5 tablets] TID    Dispense:  450 tablet    Refill:  0    Fill on/after 09/29/2016  . nebivolol (BYSTOLIC) 10 MG tablet    Sig: Take 1 tablet (10 mg total) by mouth daily.    Dispense:  140 tablet    Refill:  0     Follow-up: Return in about 3 months (around 10/27/2016).  Scarlette Calico, MD

## 2016-07-30 ENCOUNTER — Encounter: Payer: Self-pay | Admitting: Internal Medicine

## 2016-07-30 LAB — LIPID PANEL
Cholesterol: 172 mg/dL (ref 0–200)
HDL: 38.8 mg/dL — AB (ref >39.00)
NONHDL: 133.54
Total CHOL/HDL Ratio: 4
Triglycerides: 210 mg/dL — ABNORMAL HIGH (ref 0.0–149.0)
VLDL: 42 mg/dL — ABNORMAL HIGH (ref 0.0–40.0)

## 2016-07-30 LAB — BASIC METABOLIC PANEL
BUN: 13 mg/dL (ref 6–23)
CHLORIDE: 98 meq/L (ref 96–112)
CO2: 34 meq/L — AB (ref 19–32)
Calcium: 9.9 mg/dL (ref 8.4–10.5)
Creatinine, Ser: 1.08 mg/dL (ref 0.40–1.20)
GFR: 56.69 mL/min — ABNORMAL LOW (ref >60.00)
Glucose, Bld: 129 mg/dL — ABNORMAL HIGH (ref 70–99)
Potassium: 3.8 mEq/L (ref 3.5–5.1)
SODIUM: 140 meq/L (ref 135–145)

## 2016-07-30 LAB — LDL CHOLESTEROL, DIRECT: LDL DIRECT: 100 mg/dL

## 2016-07-30 MED ORDER — NEBIVOLOL HCL 10 MG PO TABS: 10.0000 mg | ORAL_TABLET | Freq: Every day | ORAL | 0 refills | Status: DC

## 2016-08-13 NOTE — Telephone Encounter (Signed)
Canceled due to inactivity 

## 2016-09-28 ENCOUNTER — Telehealth: Payer: Self-pay | Admitting: Internal Medicine

## 2016-10-26 ENCOUNTER — Ambulatory Visit (INDEPENDENT_AMBULATORY_CARE_PROVIDER_SITE_OTHER): Payer: PRIVATE HEALTH INSURANCE | Admitting: Internal Medicine

## 2016-10-26 VITALS — BP 170/100 | HR 89 | Temp 98.1°F | Resp 16 | Ht 60.0 in | Wt 145.0 lb

## 2016-10-26 DIAGNOSIS — M4802 Spinal stenosis, cervical region: Secondary | ICD-10-CM

## 2016-10-26 DIAGNOSIS — I10 Essential (primary) hypertension: Secondary | ICD-10-CM

## 2016-10-26 DIAGNOSIS — G894 Chronic pain syndrome: Secondary | ICD-10-CM

## 2016-10-26 MED ORDER — METHADONE HCL 10 MG PO TABS: ORAL_TABLET | ORAL | 0 refills | Status: DC

## 2016-10-26 MED ORDER — CHLORTHALIDONE 25 MG PO TABS: 25.0000 mg | ORAL_TABLET | Freq: Every day | ORAL | 1 refills | Status: DC

## 2016-10-26 NOTE — Patient Instructions (Signed)

## 2016-10-26 NOTE — Progress Notes (Signed)
Subjective:  Patient ID: Emily [name], female    DOB: 12/29/1965  Age: 51 y.o. MRN: [id number]  CC: Hypertension   HPI Emily [name] presents for A blood pressure check. Her blood pressure has not been well controlled. She tells me she is taking nebivolol and telmisartan but she never picked up the prescription for chlorthalidone. She has chronic unchanged neck pain but she denies headache/blurred vision/chest pain/shortness of breath/palpitations/edema/fatigue.  I recently got a phone call from a pharmacist in [place] who complained that the dosage on her methadone was too high. I have discussed this with the patient and she agrees to decrease her dose from 50 mg 3 times a day to 40 mg 3 times a day.  Outpatient Medications Prior to Visit  Medication Sig Dispense Refill  . atorvastatin (LIPITOR) 10 MG tablet Take 1 tablet (10 mg total) by mouth daily. 90 tablet 3  . Cholecalciferol 50000 UNITS TABS Take 1 tablet by mouth once a week. 12 tablet 3  . Fe Cbn-Fe Gluc-FA-B12-C-DSS (FERRALET 90) 90-1 MG TABS Take 1 tablet by mouth daily. 90 each 3  . folic acid (FOLVITE) 1 MG tablet Take 1 tablet (1 mg total) by mouth daily. 90 tablet 3  . gabapentin (NEURONTIN) 100 MG capsule Take 1 capsule (100 mg total) by mouth 3 (three) times daily. 90 capsule 5  . loperamide (IMODIUM A-D) 2 MG tablet Take 2 mg by mouth as needed.      . nebivolol (BYSTOLIC) 10 MG tablet Take 1 tablet (10 mg total) by mouth daily. 140 tablet 0  . potassium chloride SA (K-DUR,KLOR-CON) 20 MEQ tablet Take 1 tablet (20 mEq total) by mouth 3 (three) times daily. 270 tablet 1  . sertraline (ZOLOFT) 100 MG tablet TAKE 1 TABLET IN THE MORNING AND 1/2 TAB IN THE EVENING. 45 tablet 2  . telmisartan (MICARDIS) 80 MG tablet Take 1 tablet (80 mg total) by mouth daily. 90 tablet 1  . chlorthalidone (HYGROTON) 25 MG tablet Take 1 tablet (25 mg total) by mouth daily. 90 tablet 1  . methadone (DOLOPHINE) 10 MG tablet Take a  total of 50 mg [5 tablets] TID 450 tablet 0   No facility-administered medications prior to visit.     ROS Review of Systems  Constitutional: Negative.  Negative for appetite change, diaphoresis, fatigue and unexpected weight change.  HENT: Negative.   Eyes: Negative for visual disturbance.  Respiratory: Negative for apnea, cough, chest tightness, shortness of breath and wheezing.   Cardiovascular: Negative for chest pain, palpitations and leg swelling.  Gastrointestinal: Negative for abdominal pain, blood in stool, constipation, diarrhea, nausea and vomiting.  Endocrine: Negative.   Genitourinary: Negative.  Negative for decreased urine volume, difficulty urinating, dysuria, flank pain, hematuria and urgency.  Musculoskeletal: Positive for neck pain. Negative for arthralgias, back pain, myalgias and neck stiffness.       Her neck pain is unchanged, there is no radiation of pain into her arms and she denies numbness weakness or tingling. She is getting symptom relief with methadone  Skin: Negative.  Negative for color change and rash.  Allergic/Immunologic: Negative.   Neurological: Negative.  Negative for dizziness, syncope, weakness, light-headedness and headaches.  Hematological: Negative.  Negative for adenopathy. Does not bruise/bleed easily.  Psychiatric/Behavioral: Negative.  Negative for dysphoric mood, sleep disturbance and suicidal ideas. The patient is not nervous/anxious.     Objective:  BP (!) 170/100 (BP Location: Left Arm, Patient Position: Sitting, Cuff Size: Normal)  Pulse 89   Temp 98.1 F (36.7 C) (Oral)   Resp 16   Ht 5' (1.524 m)   Wt 145 lb 0.6 oz (65.8 kg)   SpO2 99%   BMI 28.33 kg/m   BP Readings from Last 3 Encounters:  10/26/16 (!) 170/100  07/29/16 (!) 160/94  05/20/16 (!) 220/130    Wt Readings from Last 3 Encounters:  10/26/16 145 lb 0.6 oz (65.8 kg)  07/29/16 146 lb (66.2 kg)  05/20/16 147 lb 4 oz (66.8 kg)    Physical Exam    Constitutional: She is oriented to person, place, and time. No distress.  HENT:  Head: Normocephalic and atraumatic.  Mouth/Throat: Oropharynx is clear and moist. No oropharyngeal exudate.  Eyes: Conjunctivae are normal. Right eye exhibits no discharge. Left eye exhibits no discharge. No scleral icterus.  Neck: Normal range of motion. Neck supple. No JVD present. No tracheal deviation present. No thyromegaly present.  Cardiovascular: Normal rate, regular rhythm, normal heart sounds and intact distal pulses.  Exam reveals no gallop and no friction rub.   No murmur heard. Pulmonary/Chest: Effort normal and breath sounds normal. No stridor. No respiratory distress. She has no wheezes. She has no rales. She exhibits no tenderness.  Abdominal: Soft. Bowel sounds are normal. She exhibits no distension and no mass. There is no tenderness. There is no rebound and no guarding.  Musculoskeletal: Normal range of motion. She exhibits no edema, tenderness or deformity.  Lymphadenopathy:    She has no cervical adenopathy.  Neurological: She is alert and oriented to person, place, and time. She has normal reflexes. She displays normal reflexes. No cranial nerve deficit. She exhibits normal muscle tone. Coordination normal.  Skin: Skin is warm and dry. No rash noted. She is not diaphoretic. No erythema. No pallor.  Psychiatric: She has a normal mood and affect. Her behavior is normal. Judgment and thought content normal.  Vitals reviewed.   Lab Results  Component Value Date   WBC 14.5 (H) 07/29/2016   HGB 13.5 07/29/2016   HCT 41.0 07/29/2016   PLT 411.0 (H) 07/29/2016   GLUCOSE 129 (H) 07/29/2016   CHOL 172 07/29/2016   TRIG 210.0 (H) 07/29/2016   HDL 38.80 (L) 07/29/2016   LDLDIRECT 100.0 07/29/2016   LDLCALC 56 12/17/2013   ALT 5 12/23/2014   AST 19 12/23/2014   NA 140 07/29/2016   K 3.8 07/29/2016   CL 98 07/29/2016   CREATININE 1.08 07/29/2016   BUN 13 07/29/2016   CO2 34 (H) 07/29/2016    TSH 3.59 01/01/2016   INR 1.0 04/23/2008    No results found.  Assessment & Plan:   Emily [name] was seen today for hypertension.  Diagnoses and all orders for this visit:  Essential hypertension- her blood pressure is not adequately well controlled, she agrees to start the chlorthalidone and continue telmisartan and nebivolol. -     chlorthalidone (HYGROTON) 25 MG tablet; Take 1 tablet (25 mg total) by mouth daily.  Cervical spinal stenosis- will slowly start decreasing her methadone dose. A urine drug screen was collected today. -     Discontinue: methadone (DOLOPHINE) 10 MG tablet; Take a total of 40 mg [4 tablets] TID -     Discontinue: methadone (DOLOPHINE) 10 MG tablet; Take a total of 40 mg [4 tablets] TID -     methadone (DOLOPHINE) 10 MG tablet; Take a total of 40 mg [4 tablets] TID  Chronic pain syndrome -     Discontinue: methadone (  DOLOPHINE) 10 MG tablet; Take a total of 40 mg [4 tablets] TID -     Discontinue: methadone (DOLOPHINE) 10 MG tablet; Take a total of 40 mg [4 tablets] TID -     methadone (DOLOPHINE) 10 MG tablet; Take a total of 40 mg [4 tablets] TID   I am having Emily [name] maintain her loperamide, sertraline, Cholecalciferol, folic acid, FERRALET 90, gabapentin, atorvastatin, telmisartan, potassium chloride SA, nebivolol, chlorthalidone, and methadone.  Meds ordered this encounter  Medications  . chlorthalidone (HYGROTON) 25 MG tablet    Sig: Take 1 tablet (25 mg total) by mouth daily.    Dispense:  90 tablet    Refill:  1  . DISCONTD: methadone (DOLOPHINE) 10 MG tablet    Sig: Take a total of 40 mg [4 tablets] TID    Dispense:  360 tablet    Refill:  0    Fill on/after 10/27/2016  . DISCONTD: methadone (DOLOPHINE) 10 MG tablet    Sig: Take a total of 40 mg [4 tablets] TID    Dispense:  360 tablet    Refill:  0    Fill on/after 11/27/2016  . methadone (DOLOPHINE) 10 MG tablet    Sig: Take a total of 40 mg [4 tablets] TID    Dispense:  360  tablet    Refill:  0    Fill on/after 10/26/2016     Follow-up: Return in about 2 months (around 12/26/2016).  Sanda Lingerhomas Marlowe Lawes, MD

## 2016-10-26 NOTE — Progress Notes (Signed)
Pre visit review using our clinic review tool, if applicable. No additional management support is needed unless otherwise documented below in the visit note. 

## 2016-10-27 ENCOUNTER — Encounter: Payer: Self-pay | Admitting: Internal Medicine

## 2016-11-01 ENCOUNTER — Ambulatory Visit: Payer: Self-pay | Admitting: Internal Medicine

## 2016-12-23 ENCOUNTER — Encounter: Payer: Self-pay | Admitting: Internal Medicine

## 2016-12-27 ENCOUNTER — Other Ambulatory Visit: Payer: Self-pay | Admitting: Internal Medicine

## 2016-12-27 ENCOUNTER — Encounter: Payer: Self-pay | Admitting: Internal Medicine

## 2016-12-27 DIAGNOSIS — G894 Chronic pain syndrome: Secondary | ICD-10-CM

## 2016-12-27 DIAGNOSIS — M4802 Spinal stenosis, cervical region: Secondary | ICD-10-CM

## 2016-12-27 MED ORDER — METHADONE HCL 10 MG PO TABS: ORAL_TABLET | ORAL | 0 refills | Status: DC

## 2016-12-27 NOTE — Telephone Encounter (Signed)
Stef,  Can this be done? TP

## 2016-12-30 ENCOUNTER — Ambulatory Visit: Payer: PRIVATE HEALTH INSURANCE | Admitting: Internal Medicine

## 2017-01-02 ENCOUNTER — Other Ambulatory Visit: Payer: Self-pay | Admitting: Internal Medicine

## 2017-01-02 DIAGNOSIS — M4802 Spinal stenosis, cervical region: Secondary | ICD-10-CM

## 2017-01-02 DIAGNOSIS — G894 Chronic pain syndrome: Secondary | ICD-10-CM

## 2017-02-01 ENCOUNTER — Encounter: Payer: Self-pay | Admitting: Internal Medicine

## 2017-02-01 ENCOUNTER — Ambulatory Visit (INDEPENDENT_AMBULATORY_CARE_PROVIDER_SITE_OTHER): Payer: Self-pay | Admitting: Internal Medicine

## 2017-02-01 ENCOUNTER — Other Ambulatory Visit (INDEPENDENT_AMBULATORY_CARE_PROVIDER_SITE_OTHER): Payer: Self-pay

## 2017-02-01 VITALS — BP 190/110 | HR 87 | Temp 98.8°F | Resp 16 | Ht 60.0 in | Wt 147.8 lb

## 2017-02-01 DIAGNOSIS — M4802 Spinal stenosis, cervical region: Secondary | ICD-10-CM

## 2017-02-01 DIAGNOSIS — G894 Chronic pain syndrome: Secondary | ICD-10-CM

## 2017-02-01 DIAGNOSIS — I10 Essential (primary) hypertension: Secondary | ICD-10-CM

## 2017-02-01 DIAGNOSIS — D508 Other iron deficiency anemias: Secondary | ICD-10-CM

## 2017-02-01 DIAGNOSIS — D520 Dietary folate deficiency anemia: Secondary | ICD-10-CM

## 2017-02-01 DIAGNOSIS — Z1211 Encounter for screening for malignant neoplasm of colon: Secondary | ICD-10-CM

## 2017-02-01 LAB — CBC WITH DIFFERENTIAL/PLATELET
Basophils Absolute: 0.1 10*3/uL (ref 0.0–0.1)
Basophils Relative: 1.1 % (ref 0.0–3.0)
Eosinophils Absolute: 0.2 10*3/uL (ref 0.0–0.7)
Eosinophils Relative: 1.9 % (ref 0.0–5.0)
HCT: 41 % (ref 36.0–46.0)
Hemoglobin: 13.3 g/dL (ref 12.0–15.0)
Lymphocytes Relative: 33.6 % (ref 12.0–46.0)
Lymphs Abs: 3.8 10*3/uL (ref 0.7–4.0)
MCHC: 32.4 g/dL (ref 30.0–36.0)
MCV: 82.3 fl (ref 78.0–100.0)
Monocytes Absolute: 0.8 10*3/uL (ref 0.1–1.0)
Monocytes Relative: 6.6 % (ref 3.0–12.0)
Neutro Abs: 6.5 10*3/uL (ref 1.4–7.7)
Neutrophils Relative %: 56.8 % (ref 43.0–77.0)
Platelets: 281 10*3/uL (ref 150.0–400.0)
RBC: 4.98 Mil/uL (ref 3.87–5.11)
RDW: 15.9 % — ABNORMAL HIGH (ref 11.5–15.5)
WBC: 11.4 10*3/uL — ABNORMAL HIGH (ref 4.0–10.5)

## 2017-02-01 LAB — COMPREHENSIVE METABOLIC PANEL
ALT: 5 U/L (ref 0–35)
AST: 14 U/L (ref 0–37)
Albumin: 4.4 g/dL (ref 3.5–5.2)
Alkaline Phosphatase: 80 U/L (ref 39–117)
BUN: 15 mg/dL (ref 6–23)
CO2: 29 mEq/L (ref 19–32)
Calcium: 10 mg/dL (ref 8.4–10.5)
Chloride: 98 mEq/L (ref 96–112)
Creatinine, Ser: 1.19 mg/dL (ref 0.40–1.20)
GFR: 50.59 mL/min — ABNORMAL LOW (ref >60.00)
Glucose, Bld: 179 mg/dL — ABNORMAL HIGH (ref 70–99)
Potassium: 3.6 mEq/L (ref 3.5–5.1)
Sodium: 138 mEq/L (ref 135–145)
Total Bilirubin: 0.3 mg/dL (ref 0.2–1.2)
Total Protein: 7.5 g/dL (ref 6.0–8.3)

## 2017-02-01 LAB — IBC PANEL
Iron: 71 ug/dL (ref 42–145)
Saturation Ratios: 15.2 % — ABNORMAL LOW (ref 20.0–50.0)
Transferrin: 333 mg/dL (ref 212.0–360.0)

## 2017-02-01 LAB — FOLATE: FOLATE: 10.8 ng/mL (ref >5.9)

## 2017-02-01 LAB — FERRITIN: Ferritin: 13.5 ng/mL (ref 10.0–291.0)

## 2017-02-01 MED ORDER — METHADONE HCL 10 MG PO TABS: ORAL_TABLET | ORAL | 0 refills | Status: DC

## 2017-02-01 MED ORDER — CHLORTHALIDONE 25 MG PO TABS: 25.0000 mg | ORAL_TABLET | Freq: Every day | ORAL | 1 refills | Status: DC

## 2017-02-01 NOTE — Patient Instructions (Signed)

## 2017-02-01 NOTE — Progress Notes (Signed)
Subjective:  Patient ID: Emily [name], female    DOB: 1966/07/20  Age: 51 y.o. MRN: [id number]  CC: Hypertension and Anemia   HPI Emily [name] presents for f/up - Her blood pressure has not been well controlled, she has not been taking chlorthalidone because she complained that at $100 every 90 days it was too expensive. She tells me she has been taking nebivolol and telmisartan. She has had no recent episodes of headache/blurred vision/chest pain/shortness of breath/edema/fatigue. She does complain of chronic neck pain and requests a refill on methadone.  Outpatient Medications Prior to Visit  Medication Sig Dispense Refill  . atorvastatin (LIPITOR) 10 MG tablet Take 1 tablet (10 mg total) by mouth daily. 90 tablet 3  . Cholecalciferol 50000 UNITS TABS Take 1 tablet by mouth once a week. 12 tablet 3  . Fe Cbn-Fe Gluc-FA-B12-C-DSS (FERRALET 90) 90-1 MG TABS Take 1 tablet by mouth daily. 90 each 3  . folic acid (FOLVITE) 1 MG tablet Take 1 tablet (1 mg total) by mouth daily. 90 tablet 3  . gabapentin (NEURONTIN) 100 MG capsule TAKE 1 CAPSULE BY MOUTH THREE TIMES DAILY 90 capsule 3  . loperamide (IMODIUM A-D) 2 MG tablet Take 2 mg by mouth as needed.      . nebivolol (BYSTOLIC) 10 MG tablet Take 1 tablet (10 mg total) by mouth daily. 140 tablet 0  . potassium chloride SA (K-DUR,KLOR-CON) 20 MEQ tablet Take 1 tablet (20 mEq total) by mouth 3 (three) times daily. 270 tablet 1  . sertraline (ZOLOFT) 100 MG tablet TAKE 1 TABLET IN THE MORNING AND 1/2 TAB IN THE EVENING. 45 tablet 2  . telmisartan (MICARDIS) 80 MG tablet Take 1 tablet (80 mg total) by mouth daily. 90 tablet 1  . chlorthalidone (HYGROTON) 25 MG tablet Take 1 tablet (25 mg total) by mouth daily. 90 tablet 1  . methadone (DOLOPHINE) 10 MG tablet Take a total of 30 mg [3 tablets] TID 270 tablet 0   No facility-administered medications prior to visit.     ROS Review of Systems  Constitutional: Negative.  Negative for  appetite change, diaphoresis, fatigue and unexpected weight change.  HENT: Negative.  Negative for trouble swallowing.   Eyes: Negative for visual disturbance.  Respiratory: Negative for cough, chest tightness, shortness of breath and wheezing.   Cardiovascular: Negative.  Negative for chest pain, palpitations and leg swelling.  Gastrointestinal: Negative for abdominal pain, constipation, diarrhea, nausea and vomiting.  Genitourinary: Negative.  Negative for difficulty urinating.  Musculoskeletal: Positive for neck pain. Negative for arthralgias, back pain and gait problem.       He has chronic, nonradiating, unchanged neck pain.  Skin: Negative.   Neurological: Negative.  Negative for dizziness, seizures, weakness, light-headedness, numbness and headaches.  Hematological: Negative for adenopathy. Does not bruise/bleed easily.  Psychiatric/Behavioral: Negative.     Objective:  BP (!) 190/110 (BP Location: Left Arm, Patient Position: Sitting, Cuff Size: Normal)   Pulse 87   Temp 98.8 F (37.1 C) (Oral)   Resp 16   Ht 5' (1.524 m)   Wt 147 lb 12 oz (67 kg)   SpO2 99%   BMI 28.86 kg/m   BP Readings from Last 3 Encounters:  02/01/17 (!) 190/110  10/26/16 (!) 170/100  07/29/16 (!) 160/94    Wt Readings from Last 3 Encounters:  02/01/17 147 lb 12 oz (67 kg)  10/26/16 145 lb 0.6 oz (65.8 kg)  07/29/16 146 lb (66.2 kg)  Physical Exam  Constitutional: She is oriented to person, place, and time. No distress.  HENT:  Mouth/Throat: Oropharynx is clear and moist. No oropharyngeal exudate.  Eyes: Conjunctivae are normal. Right eye exhibits no discharge. Left eye exhibits no discharge. No scleral icterus.  Neck: Normal range of motion. Neck supple. No JVD present. No thyromegaly present.  Cardiovascular: Normal rate, regular rhythm and intact distal pulses.  Exam reveals no gallop.   No murmur heard. Pulmonary/Chest: Effort normal and breath sounds normal. No respiratory distress.  She has no wheezes. She has no rales. She exhibits no tenderness.  Abdominal: Soft. Bowel sounds are normal. She exhibits no distension and no mass. There is no tenderness. There is no rebound and no guarding.  Musculoskeletal: Normal range of motion. She exhibits no edema, tenderness or deformity.       Cervical back: Normal. She exhibits normal range of motion, no tenderness, no bony tenderness, no swelling, no deformity, no pain and no spasm.  Lymphadenopathy:    She has no cervical adenopathy.  Neurological: She is alert and oriented to person, place, and time. She has normal reflexes.  Skin: Skin is warm and dry. No rash noted. She is not diaphoretic. No erythema. No pallor.  Vitals reviewed.   Lab Results  Component Value Date   WBC 11.4 (H) 02/01/2017   HGB 13.3 02/01/2017   HCT 41.0 02/01/2017   PLT 281.0 02/01/2017   GLUCOSE 179 (H) 02/01/2017   CHOL 172 07/29/2016   TRIG 210.0 (H) 07/29/2016   HDL 38.80 (L) 07/29/2016   LDLDIRECT 100.0 07/29/2016   LDLCALC 56 12/17/2013   ALT 5 02/01/2017   AST 14 02/01/2017   NA 138 02/01/2017   K 3.6 02/01/2017   CL 98 02/01/2017   CREATININE 1.19 02/01/2017   BUN 15 02/01/2017   CO2 29 02/01/2017   TSH 3.08 02/01/2017   INR 1.0 04/23/2008    No results found.  Assessment & Plan:   Emily [name] was seen today for hypertension and anemia.  Diagnoses and all orders for this visit:  Essential hypertension- her blood pressure is not adequately well controlled, she has had a slight decrease in her creatinine clearance, I've asked her to take the chlorthalidone as directed and to continue with nebivolol and telmisartan. Her labs are otherwise negative for any evidence of secondary causes or end organ damage. -     chlorthalidone (HYGROTON) 25 MG tablet; Take 1 tablet (25 mg total) by mouth daily. -     Comprehensive metabolic panel; Future -     Thyroid Panel With TSH; Future  Cervical spinal stenosis- we'll continue methadone at the  current dose. -     Discontinue: methadone (DOLOPHINE) 10 MG tablet; Take a total of 30 mg [3 tablets] TID -     Discontinue: methadone (DOLOPHINE) 10 MG tablet; Take a total of 30 mg [3 tablets] TID -     methadone (DOLOPHINE) 10 MG tablet; Take a total of 30 mg [3 tablets] TID  Dietary folate deficiency anemia- improvement noted, will continue folate replacement therapy. -     CBC with Differential/Platelet; Future -     Folate; Future  Other iron deficiency anemia- white blood cell count is down compared to the prior level, her H&H are normal and the other cell lines are normal. The leukocytosis is consistent with tobacco abuse. I've asked her to see GI to undergo colon cancer screening and to continue her iron replacement therapy. -  CBC with Differential/Platelet; Future -     IBC panel; Future -     Ferritin; Future -     Ambulatory referral to Gastroenterology  Chronic pain syndrome -     Discontinue: methadone (DOLOPHINE) 10 MG tablet; Take a total of 30 mg [3 tablets] TID -     Discontinue: methadone (DOLOPHINE) 10 MG tablet; Take a total of 30 mg [3 tablets] TID -     methadone (DOLOPHINE) 10 MG tablet; Take a total of 30 mg [3 tablets] TID  Screen for colon cancer -     Ambulatory referral to Gastroenterology   I am having Emily [name] maintain her loperamide, sertraline, Cholecalciferol, folic acid, FERRALET 90, atorvastatin, telmisartan, potassium chloride SA, nebivolol, gabapentin, methadone, and chlorthalidone.  Meds ordered this encounter  Medications  . DISCONTD: methadone (DOLOPHINE) 10 MG tablet    Sig: Take a total of 30 mg [3 tablets] TID    Dispense:  270 tablet    Refill:  0    Fill on/after 02/01/2017  . DISCONTD: methadone (DOLOPHINE) 10 MG tablet    Sig: Take a total of 30 mg [3 tablets] TID    Dispense:  270 tablet    Refill:  0    Fill on/after 03/03/2017  . methadone (DOLOPHINE) 10 MG tablet    Sig: Take a total of 30 mg [3 tablets] TID     Dispense:  270 tablet    Refill:  0    Fill on/after 04/03/2017  . chlorthalidone (HYGROTON) 25 MG tablet    Sig: Take 1 tablet (25 mg total) by mouth daily.    Dispense:  90 tablet    Refill:  1     Follow-up: Return in about 4 weeks (around 03/01/2017).  Sanda Lingerhomas Gretna Bergin, MD

## 2017-02-02 ENCOUNTER — Encounter: Payer: Self-pay | Admitting: Internal Medicine

## 2017-02-02 LAB — THYROID PANEL WITH TSH
FREE THYROXINE INDEX: 1.6 (ref 1.4–3.8)
T3 Uptake: 26 % (ref 22–35)
T4, Total: 6.2 ug/dL (ref 4.5–12.0)
TSH: 3.08 mIU/L

## 2017-03-07 ENCOUNTER — Encounter: Payer: Self-pay | Admitting: Internal Medicine

## 2017-05-04 ENCOUNTER — Ambulatory Visit (INDEPENDENT_AMBULATORY_CARE_PROVIDER_SITE_OTHER): Payer: Self-pay | Admitting: Internal Medicine

## 2017-05-04 ENCOUNTER — Encounter: Payer: Self-pay | Admitting: Internal Medicine

## 2017-05-04 ENCOUNTER — Other Ambulatory Visit (INDEPENDENT_AMBULATORY_CARE_PROVIDER_SITE_OTHER): Payer: Self-pay

## 2017-05-04 VITALS — BP 180/92 | HR 82 | Temp 98.3°F | Ht 60.0 in | Wt 151.0 lb

## 2017-05-04 DIAGNOSIS — R739 Hyperglycemia, unspecified: Secondary | ICD-10-CM

## 2017-05-04 DIAGNOSIS — D508 Other iron deficiency anemias: Secondary | ICD-10-CM

## 2017-05-04 DIAGNOSIS — Z79891 Long term (current) use of opiate analgesic: Secondary | ICD-10-CM | POA: Insufficient documentation

## 2017-05-04 DIAGNOSIS — Z23 Encounter for immunization: Secondary | ICD-10-CM

## 2017-05-04 DIAGNOSIS — N183 Chronic kidney disease, stage 3 unspecified: Secondary | ICD-10-CM | POA: Insufficient documentation

## 2017-05-04 DIAGNOSIS — I10 Essential (primary) hypertension: Secondary | ICD-10-CM

## 2017-05-04 DIAGNOSIS — G894 Chronic pain syndrome: Secondary | ICD-10-CM

## 2017-05-04 DIAGNOSIS — M4802 Spinal stenosis, cervical region: Secondary | ICD-10-CM

## 2017-05-04 LAB — CBC WITH DIFFERENTIAL/PLATELET
Basophils Absolute: 0.1 10*3/uL (ref 0.0–0.1)
Basophils Relative: 1.3 % (ref 0.0–3.0)
EOS PCT: 2.5 % (ref 0.0–5.0)
Eosinophils Absolute: 0.2 10*3/uL (ref 0.0–0.7)
HCT: 40.4 % (ref 36.0–46.0)
Hemoglobin: 13.3 g/dL (ref 12.0–15.0)
LYMPHS ABS: 3.3 10*3/uL (ref 0.7–4.0)
Lymphocytes Relative: 35.7 % (ref 12.0–46.0)
MCHC: 32.8 g/dL (ref 30.0–36.0)
MCV: 86.4 fl (ref 78.0–100.0)
MONOS PCT: 7 % (ref 3.0–12.0)
Monocytes Absolute: 0.7 10*3/uL (ref 0.1–1.0)
NEUTROS ABS: 5 10*3/uL (ref 1.4–7.7)
NEUTROS PCT: 53.5 % (ref 43.0–77.0)
PLATELETS: 279 10*3/uL (ref 150.0–400.0)
RBC: 4.68 Mil/uL (ref 3.87–5.11)
RDW: 14.5 % (ref 11.5–15.5)
WBC: 9.4 10*3/uL (ref 4.0–10.5)

## 2017-05-04 LAB — BASIC METABOLIC PANEL
BUN: 29 mg/dL — AB (ref 6–23)
CALCIUM: 9.8 mg/dL (ref 8.4–10.5)
CO2: 30 meq/L (ref 19–32)
Chloride: 97 mEq/L (ref 96–112)
Creatinine, Ser: 1.54 mg/dL — ABNORMAL HIGH (ref 0.40–1.20)
GFR: 37.53 mL/min — ABNORMAL LOW (ref >60.00)
GLUCOSE: 140 mg/dL — AB (ref 70–99)
POTASSIUM: 3.6 meq/L (ref 3.5–5.1)
SODIUM: 135 meq/L (ref 135–145)

## 2017-05-04 LAB — URINALYSIS, ROUTINE W REFLEX MICROSCOPIC
BILIRUBIN URINE: NEGATIVE
Hgb urine dipstick: NEGATIVE
Ketones, ur: NEGATIVE
Leukocytes, UA: NEGATIVE
Nitrite: NEGATIVE
PH: 5.5 (ref 5.0–8.0)
RBC / HPF: NONE SEEN (ref 0–?)
Total Protein, Urine: NEGATIVE
Urine Glucose: NEGATIVE
Urobilinogen, UA: 0.2 (ref 0.0–1.0)

## 2017-05-04 LAB — HEMOGLOBIN A1C: Hgb A1c MFr Bld: 5.9 % (ref 4.6–6.5)

## 2017-05-04 MED ORDER — NEBIVOLOL HCL 10 MG PO TABS: 10.0000 mg | ORAL_TABLET | Freq: Every day | ORAL | 0 refills | Status: DC

## 2017-05-04 MED ORDER — METHADONE HCL 10 MG PO TABS: ORAL_TABLET | ORAL | 0 refills | Status: DC

## 2017-05-04 MED ORDER — OLMESARTAN MEDOXOMIL 40 MG PO TABS: 40.0000 mg | ORAL_TABLET | Freq: Every day | ORAL | 1 refills | Status: DC

## 2017-05-04 NOTE — Progress Notes (Signed)
Subjective:  Patient ID: Emily [name], female    DOB: 04/08/1966  Age: 51 y.o. MRN: [id number]  CC: Hypertension   HPI Emily [name] presents for f/up - Her blood pressure has not been well controlled. When I last saw her I prescribed telmisartan but she said it caused nausea and fatigue so stopped taking it. She has been taking Tribenzor with her other blood pressure medications. Apparently the Benicar in Tribenzor doesn't give her the same symptoms that telmisartan did. She also complains of chronic neck pain and wants a refill of methadone.  Outpatient Medications Prior to Visit  Medication Sig Dispense Refill  . atorvastatin (LIPITOR) 10 MG tablet Take 1 tablet (10 mg total) by mouth daily. 90 tablet 3  . chlorthalidone (HYGROTON) 25 MG tablet Take 1 tablet (25 mg total) by mouth daily. 90 tablet 1  . Cholecalciferol 50000 UNITS TABS Take 1 tablet by mouth once a week. 12 tablet 3  . Fe Cbn-Fe Gluc-FA-B12-C-DSS (FERRALET 90) 90-1 MG TABS Take 1 tablet by mouth daily. 90 each 3  . folic acid (FOLVITE) 1 MG tablet Take 1 tablet (1 mg total) by mouth daily. 90 tablet 3  . gabapentin (NEURONTIN) 100 MG capsule TAKE 1 CAPSULE BY MOUTH THREE TIMES DAILY 90 capsule 3  . loperamide (IMODIUM A-D) 2 MG tablet Take 2 mg by mouth as needed.      . potassium chloride SA (K-DUR,KLOR-CON) 20 MEQ tablet Take 1 tablet (20 mEq total) by mouth 3 (three) times daily. 270 tablet 1  . sertraline (ZOLOFT) 100 MG tablet TAKE 1 TABLET IN THE MORNING AND 1/2 TAB IN THE EVENING. 45 tablet 2  . methadone (DOLOPHINE) 10 MG tablet Take a total of 30 mg [3 tablets] TID 270 tablet 0  . nebivolol (BYSTOLIC) 10 MG tablet Take 1 tablet (10 mg total) by mouth daily. 140 tablet 0  . telmisartan (MICARDIS) 80 MG tablet Take 1 tablet (80 mg total) by mouth daily. 90 tablet 1   No facility-administered medications prior to visit.     ROS Review of Systems  Constitutional: Negative for appetite change,  diaphoresis, fatigue and unexpected weight change.  HENT: Negative.  Negative for trouble swallowing.   Eyes: Negative for visual disturbance.  Respiratory: Negative for cough, chest tightness, shortness of breath and wheezing.   Cardiovascular: Negative for chest pain, palpitations and leg swelling.  Gastrointestinal: Negative for abdominal pain, constipation, diarrhea, nausea and vomiting.  Endocrine: Negative.   Genitourinary: Negative.  Negative for decreased urine volume, difficulty urinating, dysuria, frequency, hematuria and urgency.  Musculoskeletal: Positive for neck pain. Negative for back pain and neck stiffness.  Skin: Negative.   Allergic/Immunologic: Negative.   Neurological: Negative.  Negative for dizziness, weakness, light-headedness and numbness.  Hematological: Negative for adenopathy. Does not bruise/bleed easily.  Psychiatric/Behavioral: Negative.     Objective:  BP (!) 180/92 (BP Location: Left Arm, Patient Position: Sitting, Cuff Size: Normal)   Pulse 82   Temp 98.3 F (36.8 C) (Oral)   Ht 5' (1.524 m)   Wt 151 lb (68.5 kg)   SpO2 98%   BMI 29.49 kg/m   BP Readings from Last 3 Encounters:  05/04/17 (!) 180/92  02/01/17 (!) 190/110  10/26/16 (!) 170/100    Wt Readings from Last 3 Encounters:  05/04/17 151 lb (68.5 kg)  02/01/17 147 lb 12 oz (67 kg)  10/26/16 145 lb 0.6 oz (65.8 kg)    Physical Exam  Constitutional: She is oriented  to person, place, and time. No distress.  HENT:  Mouth/Throat: Oropharynx is clear and moist. No oropharyngeal exudate.  Eyes: Conjunctivae are normal. Right eye exhibits no discharge. Left eye exhibits no discharge. No scleral icterus.  Neck: Normal range of motion. Neck supple. No JVD present. No thyromegaly present.  Cardiovascular: Normal rate, regular rhythm and intact distal pulses.  Exam reveals no gallop and no friction rub.   No murmur heard. Pulmonary/Chest: Effort normal and breath sounds normal. No respiratory  distress. She has no wheezes. She has no rales. She exhibits no tenderness.  Abdominal: Soft. Bowel sounds are normal. She exhibits no distension and no mass. There is no tenderness. There is no rebound and no guarding.  Musculoskeletal: Normal range of motion. She exhibits no edema, tenderness or deformity.  Lymphadenopathy:    She has no cervical adenopathy.  Neurological: She is alert and oriented to person, place, and time.  Skin: Skin is warm and dry. No rash noted. She is not diaphoretic. No erythema. No pallor.  Vitals reviewed.   Lab Results  Component Value Date   WBC 9.4 05/04/2017   HGB 13.3 05/04/2017   HCT 40.4 05/04/2017   PLT 279.0 05/04/2017   GLUCOSE 140 (H) 05/04/2017   CHOL 172 07/29/2016   TRIG 210.0 (H) 07/29/2016   HDL 38.80 (L) 07/29/2016   LDLDIRECT 100.0 07/29/2016   LDLCALC 56 12/17/2013   ALT 5 02/01/2017   AST 14 02/01/2017   NA 135 05/04/2017   K 3.6 05/04/2017   CL 97 05/04/2017   CREATININE 1.54 (H) 05/04/2017   BUN 29 (H) 05/04/2017   CO2 30 05/04/2017   TSH 3.08 02/01/2017   INR 1.0 04/23/2008   HGBA1C 5.9 05/04/2017    No results found.  Assessment & Plan:   Chamille was seen today for hypertension.  Diagnoses and all orders for this visit:  Need for influenza vaccination -     Flu Vaccine QUAD 36+ mos IM  Essential hypertension - Her blood pressure is not well controlled and she has recently been taking Tribenzor in addition to chlorthalidone. The Tribenzor was 52 years old. I've asked her to keep taking chlorthalidone and will try to control her blood pressure with olmesartan and nebivolol. She's had a significant decline in her renal function so I will order an ultrasound of her kidneys to screen for renal artery stenosis, asymmetry, metabolic kidney disease, masses. -     Basic metabolic panel; Future -     Urinalysis, Routine w reflex microscopic; Future -     olmesartan (BENICAR) 40 MG tablet; Take 1 tablet (40 mg total) by mouth  daily. -     nebivolol (BYSTOLIC) 10 MG tablet; Take 1 tablet (10 mg total) by mouth daily.  Encounter for long-term opiate analgesic use- her urine drug screen is negative for opiates. I've asked her if she is taking the methadone. If she has stopped taking it then I will stop prescribing it. -     Pain Mgmt, Profile 8 w/Conf, U; Future  Other iron deficiency anemia- her H&H are normal now. Will continue iron replacement therapy. -     CBC with Differential/Platelet; Future  Hyperglycemia- her A1c is 5.9%. She is mildly prediabetic. No medications are needed at this time. -     Basic metabolic panel; Future -     Hemoglobin A1c; Future  Chronic pain syndrome -     Discontinue: methadone (DOLOPHINE) 10 MG tablet; Take a total of 30  mg [3 tablets] TID -     Discontinue: methadone (DOLOPHINE) 10 MG tablet; Take a total of 30 mg [3 tablets] TID -     methadone (DOLOPHINE) 10 MG tablet; Take a total of 30 mg [3 tablets] TID  Cervical spinal stenosis -     Discontinue: methadone (DOLOPHINE) 10 MG tablet; Take a total of 30 mg [3 tablets] TID -     Discontinue: methadone (DOLOPHINE) 10 MG tablet; Take a total of 30 mg [3 tablets] TID -     methadone (DOLOPHINE) 10 MG tablet; Take a total of 30 mg [3 tablets] TID  Chronic renal disease, stage 3, moderately decreased glomerular filtration rate (GFR) between 30-5mL/min/1.73 square meter- as above -     US Renal; Future -     US Renal Artery Stenosis; Future   I have discontinued Ms. Gilbert's telmisartan. I am also having her start on olmesartan. Additionally, I am having her maintain her loperamide, sertraline, Cholecalciferol, folic acid, FERRALET 90, atorvastatin, potassium chloride SA, gabapentin, chlorthalidone, methadone, and nebivolol.  Meds ordered this encounter  Medications  . olmesartan (BENICAR) 40 MG tablet    Sig: Take 1 tablet (40 mg total) by mouth daily.    Dispense:  90 tablet    Refill:  1  . DISCONTD: methadone  (DOLOPHINE) 10 MG tablet    Sig: Take a total of 30 mg [3 tablets] TID    Dispense:  270 tablet    Refill:  0    Fill on/after 05/04/2017  . DISCONTD: methadone (DOLOPHINE) 10 MG tablet    Sig: Take a total of 30 mg [3 tablets] TID    Dispense:  270 tablet    Refill:  0    Fill on/after 06/03/2017  . methadone (DOLOPHINE) 10 MG tablet    Sig: Take a total of 30 mg [3 tablets] TID    Dispense:  270 tablet    Refill:  0    Fill on/after 07/04/2017  . nebivolol (BYSTOLIC) 10 MG tablet    Sig: Take 1 tablet (10 mg total) by mouth daily.    Dispense:  84 tablet    Refill:  0     Follow-up: Return in about 6 weeks (around 06/15/2017).  Sanda Linger, MD

## 2017-05-04 NOTE — Patient Instructions (Signed)

## 2017-05-05 LAB — PAIN MGMT, PROFILE 8 W/CONF, U
6 Acetylmorphine: NEGATIVE ng/mL (ref <10)
ALCOHOL METABOLITES: NEGATIVE ng/mL (ref <500)
Amphetamines: NEGATIVE ng/mL (ref <500)
BENZODIAZEPINES: NEGATIVE ng/mL (ref <100)
Buprenorphine, Urine: NEGATIVE ng/mL (ref <5)
COCAINE METABOLITE: NEGATIVE ng/mL (ref <150)
CREATININE: 44.1 mg/dL
MDMA: NEGATIVE ng/mL (ref <500)
Marijuana Metabolite: NEGATIVE ng/mL (ref <20)
OXIDANT: NEGATIVE ug/mL (ref <200)
Opiates: NEGATIVE ng/mL (ref <100)
Oxycodone: NEGATIVE ng/mL (ref <100)
PH: 5.41 (ref 4.5–9.0)

## 2017-05-06 ENCOUNTER — Encounter: Payer: Self-pay | Admitting: Internal Medicine

## 2017-05-25 ENCOUNTER — Encounter: Payer: Self-pay | Admitting: Internal Medicine

## 2017-06-16 ENCOUNTER — Encounter: Payer: Self-pay | Admitting: Internal Medicine

## 2017-06-16 NOTE — Telephone Encounter (Signed)
Contacted pt and informed that she will need to see a provider local to her for a prescription of abx.

## 2017-07-19 ENCOUNTER — Encounter: Payer: Self-pay | Admitting: Internal Medicine

## 2017-07-20 ENCOUNTER — Encounter: Payer: Self-pay | Admitting: Internal Medicine

## 2017-07-20 ENCOUNTER — Other Ambulatory Visit: Payer: Self-pay | Admitting: Internal Medicine

## 2017-07-21 ENCOUNTER — Encounter: Payer: Self-pay | Admitting: Internal Medicine

## 2017-07-28 ENCOUNTER — Ambulatory Visit: Payer: Self-pay | Admitting: Internal Medicine

## 2017-08-03 ENCOUNTER — Telehealth: Payer: Self-pay | Admitting: Internal Medicine

## 2017-08-03 ENCOUNTER — Ambulatory Visit (INDEPENDENT_AMBULATORY_CARE_PROVIDER_SITE_OTHER): Payer: Self-pay | Admitting: Internal Medicine

## 2017-08-03 ENCOUNTER — Encounter: Payer: Self-pay | Admitting: Internal Medicine

## 2017-08-03 ENCOUNTER — Other Ambulatory Visit (INDEPENDENT_AMBULATORY_CARE_PROVIDER_SITE_OTHER): Payer: Self-pay

## 2017-08-03 VITALS — BP 190/110 | HR 75 | Temp 98.1°F | Resp 16 | Ht 60.0 in | Wt 156.1 lb

## 2017-08-03 DIAGNOSIS — N183 Chronic kidney disease, stage 3 unspecified: Secondary | ICD-10-CM

## 2017-08-03 DIAGNOSIS — Z79891 Long term (current) use of opiate analgesic: Secondary | ICD-10-CM

## 2017-08-03 DIAGNOSIS — E785 Hyperlipidemia, unspecified: Secondary | ICD-10-CM

## 2017-08-03 DIAGNOSIS — G894 Chronic pain syndrome: Secondary | ICD-10-CM

## 2017-08-03 DIAGNOSIS — I10 Essential (primary) hypertension: Secondary | ICD-10-CM

## 2017-08-03 DIAGNOSIS — E781 Pure hyperglyceridemia: Secondary | ICD-10-CM

## 2017-08-03 DIAGNOSIS — E876 Hypokalemia: Secondary | ICD-10-CM

## 2017-08-03 DIAGNOSIS — M4802 Spinal stenosis, cervical region: Secondary | ICD-10-CM

## 2017-08-03 LAB — URINALYSIS, ROUTINE W REFLEX MICROSCOPIC
Bilirubin Urine: NEGATIVE
Ketones, ur: NEGATIVE
Nitrite: NEGATIVE
SPECIFIC GRAVITY, URINE: 1.01 (ref 1.000–1.030)
Total Protein, Urine: NEGATIVE
URINE GLUCOSE: NEGATIVE
UROBILINOGEN UA: 0.2 (ref 0.0–1.0)
pH: 5.5 (ref 5.0–8.0)

## 2017-08-03 LAB — LIPID PANEL
CHOL/HDL RATIO: 7
CHOLESTEROL: 170 mg/dL (ref 0–200)
HDL: 25.7 mg/dL — AB (ref >39.00)
Triglycerides: 541 mg/dL — ABNORMAL HIGH (ref 0.0–149.0)

## 2017-08-03 LAB — BASIC METABOLIC PANEL
BUN: 16 mg/dL (ref 6–23)
CALCIUM: 9.7 mg/dL (ref 8.4–10.5)
CO2: 30 mEq/L (ref 19–32)
CREATININE: 1.1 mg/dL (ref 0.40–1.20)
Chloride: 98 mEq/L (ref 96–112)
GFR: 55.29 mL/min — ABNORMAL LOW (ref >60.00)
Glucose, Bld: 106 mg/dL — ABNORMAL HIGH (ref 70–99)
Potassium: 3.3 mEq/L — ABNORMAL LOW (ref 3.5–5.1)
Sodium: 136 mEq/L (ref 135–145)

## 2017-08-03 LAB — CORTISOL: CORTISOL PLASMA: 15.7 ug/dL

## 2017-08-03 LAB — LDL CHOLESTEROL, DIRECT: LDL DIRECT: 95 mg/dL

## 2017-08-03 MED ORDER — METHADONE HCL 10 MG PO TABS: ORAL_TABLET | ORAL | 0 refills | Status: DC

## 2017-08-03 MED ORDER — NEBIVOLOL HCL 10 MG PO TABS: 10.0000 mg | ORAL_TABLET | Freq: Every day | ORAL | 1 refills | Status: DC

## 2017-08-03 MED ORDER — CHLORTHALIDONE 25 MG PO TABS: 25.0000 mg | ORAL_TABLET | Freq: Every day | ORAL | 1 refills | Status: DC

## 2017-08-03 MED ORDER — AMLODIPINE BESYLATE 10 MG PO TABS: 10.0000 mg | ORAL_TABLET | Freq: Every day | ORAL | 1 refills | Status: DC

## 2017-08-03 MED ORDER — POTASSIUM CHLORIDE CRYS ER 20 MEQ PO TBCR: 20.0000 meq | EXTENDED_RELEASE_TABLET | Freq: Three times a day (TID) | ORAL | 1 refills | Status: DC

## 2017-08-03 MED ORDER — OLMESARTAN MEDOXOMIL 40 MG PO TABS: 40.0000 mg | ORAL_TABLET | Freq: Every day | ORAL | 1 refills | Status: DC

## 2017-08-03 NOTE — Telephone Encounter (Signed)
Copied from [place] [id number]. Topic: Referral - Request >> Aug 03, 2017  1:46 PM Robina Ade, Helene Kelp D wrote: Reason for CRM: Judson Roch from [facility] needs a new order for US Renal & US Renal Artery Duplex Complete. Patient is trying to schedule an appt. She can be reached at [phone] ext. [phone]

## 2017-08-03 NOTE — Progress Notes (Signed)
Subjective:  Patient ID: Emily [name], female    DOB: 1965/09/20  Age: 51 y.o. MRN: [id number]  CC: Hypertension   HPI Emily [name] presents for f/up - she suffers from chronic pain and requests a refill on methadone.  She otherwise feels well and offers no complaints.  She tells me she is compliant with her blood pressure medicines including nebivolol, olmesartan, and chlorthalidone.  She denies any recent episodes of headache/blurred vision/chest pain/shortness of breath/palpitations/edema/or fatigue.  Outpatient Medications Prior to Visit  Medication Sig Dispense Refill  . atorvastatin (LIPITOR) 10 MG tablet Take 1 tablet (10 mg total) by mouth daily. 90 tablet 3  . Cholecalciferol 50000 UNITS TABS Take 1 tablet by mouth once a week. 12 tablet 3  . Fe Cbn-Fe Gluc-FA-B12-C-DSS (FERRALET 90) 90-1 MG TABS Take 1 tablet by mouth daily. 90 each 3  . folic acid (FOLVITE) 1 MG tablet Take 1 tablet (1 mg total) by mouth daily. 90 tablet 3  . gabapentin (NEURONTIN) 100 MG capsule TAKE 1 CAPSULE BY MOUTH THREE TIMES DAILY 90 capsule 3  . loperamide (IMODIUM A-D) 2 MG tablet Take 2 mg by mouth as needed.      . sertraline (ZOLOFT) 100 MG tablet TAKE 1 TABLET IN THE MORNING AND 1/2 TAB IN THE EVENING. 45 tablet 2  . chlorthalidone (HYGROTON) 25 MG tablet Take 1 tablet (25 mg total) by mouth daily. 90 tablet 1  . methadone (DOLOPHINE) 10 MG tablet Take a total of 30 mg [3 tablets] TID 270 tablet 0  . nebivolol (BYSTOLIC) 10 MG tablet Take 1 tablet (10 mg total) by mouth daily. 84 tablet 0  . olmesartan (BENICAR) 40 MG tablet Take 1 tablet (40 mg total) by mouth daily. 90 tablet 1  . potassium chloride SA (K-DUR,KLOR-CON) 20 MEQ tablet Take 1 tablet (20 mEq total) by mouth 3 (three) times daily. 270 tablet 1   No facility-administered medications prior to visit.     ROS Review of Systems  Constitutional: Negative.  Negative for diaphoresis and fatigue.  HENT: Negative.  Negative for  trouble swallowing and voice change.   Eyes: Negative.  Negative for pain and visual disturbance.  Respiratory: Negative for cough, chest tightness, shortness of breath and wheezing.   Cardiovascular: Negative for chest pain, palpitations and leg swelling.  Gastrointestinal: Negative for abdominal pain, constipation, diarrhea, nausea and vomiting.  Endocrine: Negative.   Genitourinary: Negative.  Negative for decreased urine volume, difficulty urinating, dysuria, flank pain and frequency.  Musculoskeletal: Positive for neck pain. Negative for arthralgias, back pain and myalgias.  Skin: Negative for color change and rash.  Neurological: Negative.  Negative for dizziness, weakness and light-headedness.  Hematological: Negative for adenopathy. Does not bruise/bleed easily.  Psychiatric/Behavioral: Negative.  Negative for dysphoric mood and sleep disturbance. The patient is not nervous/anxious.     Objective:  BP (!) 190/110 (BP Location: Right Arm, Patient Position: Sitting, Cuff Size: Normal)   Pulse 75   Temp 98.1 F (36.7 C) (Oral)   Resp 16   Ht 5' (1.524 m)   Wt 156 lb 1.3 oz (70.8 kg)   SpO2 99%   BMI 30.48 kg/m   BP Readings from Last 3 Encounters:  08/03/17 (!) 190/110  05/04/17 (!) 180/92  02/01/17 (!) 190/110    Wt Readings from Last 3 Encounters:  08/03/17 156 lb 1.3 oz (70.8 kg)  05/04/17 151 lb (68.5 kg)  02/01/17 147 lb 12 oz (67 kg)    Physical  Exam  Constitutional: She is oriented to person, place, and time. No distress.  HENT:  Mouth/Throat: Oropharynx is clear and moist. No oropharyngeal exudate.  Eyes: Conjunctivae are normal. Left eye exhibits no discharge. No scleral icterus.  Neck: Normal range of motion. Neck supple. No JVD present. No thyromegaly present.  Cardiovascular: Normal rate, regular rhythm and normal heart sounds.  No murmur heard. Pulmonary/Chest: Effort normal and breath sounds normal. No respiratory distress. She has no wheezes. She has  no rales.  Abdominal: Soft. Bowel sounds are normal. She exhibits no mass. There is no guarding.  Musculoskeletal: Normal range of motion. She exhibits no edema, tenderness or deformity.  Lymphadenopathy:    She has no cervical adenopathy.  Neurological: She is alert and oriented to person, place, and time.  Skin: Skin is warm and dry. No rash noted. She is not diaphoretic. No erythema. No pallor.  Psychiatric: She has a normal mood and affect. Her behavior is normal. Judgment and thought content normal.  Vitals reviewed.   Lab Results  Component Value Date   WBC 9.4 05/04/2017   HGB 13.3 05/04/2017   HCT 40.4 05/04/2017   PLT 279.0 05/04/2017   GLUCOSE 106 (H) 08/03/2017   CHOL 170 08/03/2017   TRIG (H) 08/03/2017    541.0 Triglyceride is over 400; calculations on Lipids are invalid.   HDL 25.70 (L) 08/03/2017   LDLDIRECT 95.0 08/03/2017   LDLCALC 56 12/17/2013   ALT 5 02/01/2017   AST 14 02/01/2017   NA 136 08/03/2017   K 3.3 (L) 08/03/2017   CL 98 08/03/2017   CREATININE 1.10 08/03/2017   BUN 16 08/03/2017   CO2 30 08/03/2017   TSH 3.08 02/01/2017   INR 1.0 04/23/2008   HGBA1C 5.9 05/04/2017    No results found.  Assessment & Plan:   Deshia was seen today for hypertension.  Diagnoses and all orders for this visit:  Encounter for long-term opiate analgesic use-we will check a UDS to see that she is compliant with the methadone. -     Methadone (GC/LC/MS), urine; Future  Chronic renal disease, stage 3, moderately decreased glomerular filtration rate (GFR) between 30-59 mL/min/1.73 square meter (HCC)-her creatinine clearance has improved.  It is up to 53 mL's per minute now.  I will attempt to get better blood pressure control.  She agrees to avoid nephrotoxic agents and to go ahead and have the previously ordered renal ultrasound done in the next few weeks. -     Basic metabolic panel; Future -     Urinalysis, Routine w reflex microscopic; Future  Essential  hypertension-her blood pressure is not adequately well controlled.  We will continue the current 3 drug regimen but will also add on amlodipine.  Potassium level is low so have asked her to restart a potassium supplement. -     Basic metabolic panel; Future -     Urinalysis, Routine w reflex microscopic; Future -     chlorthalidone (HYGROTON) 25 MG tablet; Take 1 tablet (25 mg total) by mouth daily. -     nebivolol (BYSTOLIC) 10 MG tablet; Take 1 tablet (10 mg total) by mouth daily. -     olmesartan (BENICAR) 40 MG tablet; Take 1 tablet (40 mg total) by mouth daily. -     amLODipine (NORVASC) 10 MG tablet; Take 1 tablet (10 mg total) by mouth daily. -     Cortisol; Future -     potassium chloride SA (K-DUR,KLOR-CON) 20 MEQ tablet; Take  1 tablet (20 mEq total) by mouth 3 (three) times daily.  Hyperlipidemia with target LDL less than 70-she has achieved her LDL goal is doing well on the statin. -     Lipid panel; Future  Hypertriglyceridemia- her triglycerides are above 500.  I have asked her to start taking an omega-3 fish oil to reduce complications. -     Lipid panel; Future -     omega-3 acid ethyl esters (LOVAZA) 1 g capsule; Take 2 capsules (2 g total) by mouth 2 (two) times daily.  Chronic pain syndrome -     methadone (DOLOPHINE) 10 MG tablet; Take a total of 30 mg [3 tablets] TID  Cervical spinal stenosis -     methadone (DOLOPHINE) 10 MG tablet; Take a total of 30 mg [3 tablets] TID  Hypokalemia -     potassium chloride SA (K-DUR,KLOR-CON) 20 MEQ tablet; Take 1 tablet (20 mEq total) by mouth 3 (three) times daily.   I am having Emily [name] start on amLODipine and omega-3 acid ethyl esters. I am also having her maintain her loperamide, sertraline, Cholecalciferol, folic acid, FERRALET 90, atorvastatin, gabapentin, chlorthalidone, nebivolol, olmesartan, methadone, and potassium chloride SA.  Meds ordered this encounter  Medications  . chlorthalidone (HYGROTON) 25 MG  tablet    Sig: Take 1 tablet (25 mg total) by mouth daily.    Dispense:  90 tablet    Refill:  1  . nebivolol (BYSTOLIC) 10 MG tablet    Sig: Take 1 tablet (10 mg total) by mouth daily.    Dispense:  90 tablet    Refill:  1  . olmesartan (BENICAR) 40 MG tablet    Sig: Take 1 tablet (40 mg total) by mouth daily.    Dispense:  90 tablet    Refill:  1  . amLODipine (NORVASC) 10 MG tablet    Sig: Take 1 tablet (10 mg total) by mouth daily.    Dispense:  90 tablet    Refill:  1  . methadone (DOLOPHINE) 10 MG tablet    Sig: Take a total of 30 mg [3 tablets] TID    Dispense:  270 tablet    Refill:  0  . potassium chloride SA (K-DUR,KLOR-CON) 20 MEQ tablet    Sig: Take 1 tablet (20 mEq total) by mouth 3 (three) times daily.    Dispense:  270 tablet    Refill:  1  . omega-3 acid ethyl esters (LOVAZA) 1 g capsule    Sig: Take 2 capsules (2 g total) by mouth 2 (two) times daily.    Dispense:  120 capsule    Refill:  5     Follow-up: Return in about 2 months (around 10/04/2017).  Sanda Lingerhomas Alfrieda Tarry, MD

## 2017-08-03 NOTE — Patient Instructions (Signed)

## 2017-08-03 NOTE — Telephone Encounter (Signed)
These have been reordered as requested.

## 2017-08-04 ENCOUNTER — Encounter: Payer: Self-pay | Admitting: Internal Medicine

## 2017-08-04 MED ORDER — OMEGA-3-ACID ETHYL ESTERS 1 G PO CAPS: 2.0000 g | ORAL_CAPSULE | Freq: Two times a day (BID) | ORAL | 5 refills | Status: DC

## 2017-08-05 LAB — METHADONE (GC/LC/MS), URINE
EDDP: 14235 ng/mL — AB (ref <100)
Methadone: 16413 ng/mL — ABNORMAL HIGH (ref <100)

## 2017-08-15 ENCOUNTER — Encounter: Payer: Self-pay | Admitting: Internal Medicine

## 2017-08-15 ENCOUNTER — Ambulatory Visit
Admission: RE | Admit: 2017-08-15 | Discharge: 2017-08-15 | Disposition: A | Discharge status: Home / Self Care | Payer: No Typology Code available for payment source | Source: Ambulatory Visit | Attending: Internal Medicine | Admitting: Internal Medicine

## 2017-08-15 DIAGNOSIS — N183 Chronic kidney disease, stage 3 unspecified: Secondary | ICD-10-CM

## 2017-08-15 DIAGNOSIS — I10 Essential (primary) hypertension: Secondary | ICD-10-CM

## 2017-08-31 ENCOUNTER — Other Ambulatory Visit: Payer: Self-pay | Admitting: Internal Medicine

## 2017-08-31 ENCOUNTER — Encounter: Payer: Self-pay | Admitting: Internal Medicine

## 2017-08-31 DIAGNOSIS — M4802 Spinal stenosis, cervical region: Secondary | ICD-10-CM

## 2017-08-31 DIAGNOSIS — G894 Chronic pain syndrome: Secondary | ICD-10-CM

## 2017-08-31 MED ORDER — METHADONE HCL 10 MG PO TABS: ORAL_TABLET | ORAL | 0 refills | Status: DC

## 2017-09-27 ENCOUNTER — Other Ambulatory Visit: Payer: Self-pay | Admitting: Internal Medicine

## 2017-09-27 DIAGNOSIS — G894 Chronic pain syndrome: Secondary | ICD-10-CM

## 2017-09-27 DIAGNOSIS — M4802 Spinal stenosis, cervical region: Secondary | ICD-10-CM

## 2017-09-28 ENCOUNTER — Encounter: Payer: Self-pay | Admitting: Internal Medicine

## 2017-09-29 ENCOUNTER — Other Ambulatory Visit: Payer: Self-pay | Admitting: Internal Medicine

## 2017-09-29 DIAGNOSIS — M4802 Spinal stenosis, cervical region: Secondary | ICD-10-CM

## 2017-09-29 DIAGNOSIS — G894 Chronic pain syndrome: Secondary | ICD-10-CM

## 2017-09-29 MED ORDER — METHADONE HCL 10 MG PO TABS: ORAL_TABLET | ORAL | 0 refills | Status: DC

## 2017-10-18 ENCOUNTER — Encounter: Payer: Self-pay | Admitting: Internal Medicine

## 2017-10-31 ENCOUNTER — Ambulatory Visit (INDEPENDENT_AMBULATORY_CARE_PROVIDER_SITE_OTHER): Payer: Self-pay | Admitting: Internal Medicine

## 2017-10-31 ENCOUNTER — Encounter: Payer: Self-pay | Admitting: Internal Medicine

## 2017-10-31 ENCOUNTER — Other Ambulatory Visit (INDEPENDENT_AMBULATORY_CARE_PROVIDER_SITE_OTHER): Payer: Self-pay

## 2017-10-31 VITALS — BP 140/90 | HR 95 | Temp 98.7°F | Resp 16 | Ht 60.0 in | Wt 161.0 lb

## 2017-10-31 DIAGNOSIS — I10 Essential (primary) hypertension: Secondary | ICD-10-CM

## 2017-10-31 DIAGNOSIS — D508 Other iron deficiency anemias: Secondary | ICD-10-CM

## 2017-10-31 DIAGNOSIS — M4802 Spinal stenosis, cervical region: Secondary | ICD-10-CM

## 2017-10-31 DIAGNOSIS — Z1231 Encounter for screening mammogram for malignant neoplasm of breast: Secondary | ICD-10-CM

## 2017-10-31 DIAGNOSIS — E781 Pure hyperglyceridemia: Secondary | ICD-10-CM

## 2017-10-31 DIAGNOSIS — E559 Vitamin D deficiency, unspecified: Secondary | ICD-10-CM

## 2017-10-31 DIAGNOSIS — Z1211 Encounter for screening for malignant neoplasm of colon: Secondary | ICD-10-CM

## 2017-10-31 DIAGNOSIS — E785 Hyperlipidemia, unspecified: Secondary | ICD-10-CM

## 2017-10-31 DIAGNOSIS — D520 Dietary folate deficiency anemia: Secondary | ICD-10-CM

## 2017-10-31 DIAGNOSIS — E538 Deficiency of other specified B group vitamins: Secondary | ICD-10-CM

## 2017-10-31 DIAGNOSIS — G894 Chronic pain syndrome: Secondary | ICD-10-CM

## 2017-10-31 LAB — IBC PANEL
Iron: 21 ug/dL — ABNORMAL LOW (ref 42–145)
SATURATION RATIOS: 4.8 % — AB (ref 20.0–50.0)
TRANSFERRIN: 314 mg/dL (ref 212.0–360.0)

## 2017-10-31 LAB — LIPID PANEL
CHOL/HDL RATIO: 5
Cholesterol: 152 mg/dL (ref 0–200)
HDL: 32.9 mg/dL — ABNORMAL LOW (ref >39.00)
LDL Cholesterol: 92 mg/dL (ref 0–99)
NONHDL: 119.57
TRIGLYCERIDES: 139 mg/dL (ref 0.0–149.0)
VLDL: 27.8 mg/dL (ref 0.0–40.0)

## 2017-10-31 LAB — BASIC METABOLIC PANEL
BUN: 13 mg/dL (ref 6–23)
CO2: 31 mEq/L (ref 19–32)
CREATININE: 1.02 mg/dL (ref 0.40–1.20)
Calcium: 9.9 mg/dL (ref 8.4–10.5)
Chloride: 99 mEq/L (ref 96–112)
GFR: 60.27 mL/min (ref >60.00)
Glucose, Bld: 108 mg/dL — ABNORMAL HIGH (ref 70–99)
Potassium: 4.2 mEq/L (ref 3.5–5.1)
Sodium: 137 mEq/L (ref 135–145)

## 2017-10-31 MED ORDER — METHADONE HCL 10 MG PO TABS: ORAL_TABLET | ORAL | 0 refills | Status: DC

## 2017-10-31 NOTE — Patient Instructions (Signed)

## 2017-10-31 NOTE — Progress Notes (Signed)
Subjective:  Patient ID: Emily [name], female    DOB: 20-Dec-1965  Age: 52 y.o. MRN: [id number]  CC: Hypertension   HPI Emily [name] presents for f/up -she complains of a several month history of worsening neck pain and mild weakness in both upper extremities.  The pain radiates into her left arm.  She denies numbness, tingling, incoordination in her arms.  She had a cervical spine procedure done about 18 years ago and she is concerned she may have to have a redo.  She wants to see a neurosurgeon in the [place] area.  She is getting adequate symptom relief with the current dose of methadone.  She tells me her blood pressure has been well controlled.  She is not taking a vitamin D supplement.  Outpatient Medications Prior to Visit  Medication Sig Dispense Refill  . amLODipine (NORVASC) 10 MG tablet Take 1 tablet (10 mg total) by mouth daily. 90 tablet 1  . atorvastatin (LIPITOR) 10 MG tablet Take 1 tablet (10 mg total) by mouth daily. 90 tablet 3  . chlorthalidone (HYGROTON) 25 MG tablet Take 1 tablet (25 mg total) by mouth daily. 90 tablet 1  . Fe Cbn-Fe Gluc-FA-B12-C-DSS (FERRALET 90) 90-1 MG TABS Take 1 tablet by mouth daily. 90 each 3  . folic acid (FOLVITE) 1 MG tablet Take 1 tablet (1 mg total) by mouth daily. 90 tablet 3  . gabapentin (NEURONTIN) 100 MG capsule TAKE 1 CAPSULE BY MOUTH THREE TIMES DAILY 90 capsule 3  . loperamide (IMODIUM A-D) 2 MG tablet Take 2 mg by mouth as needed.      . nebivolol (BYSTOLIC) 10 MG tablet Take 1 tablet (10 mg total) by mouth daily. 90 tablet 1  . olmesartan (BENICAR) 40 MG tablet Take 1 tablet (40 mg total) by mouth daily. 90 tablet 1  . omega-3 acid ethyl esters (LOVAZA) 1 g capsule Take 2 capsules (2 g total) by mouth 2 (two) times daily. 120 capsule 5  . potassium chloride SA (K-DUR,KLOR-CON) 20 MEQ tablet Take 1 tablet (20 mEq total) by mouth 3 (three) times daily. 270 tablet 1  . sertraline (ZOLOFT) 100 MG tablet TAKE 1 TABLET IN  THE MORNING AND 1/2 TAB IN THE EVENING. 45 tablet 2  . Cholecalciferol 50000 UNITS TABS Take 1 tablet by mouth once a week. 12 tablet 3  . methadone (DOLOPHINE) 10 MG tablet Take a total of 30 mg [3 tablets] TID 270 tablet 0   No facility-administered medications prior to visit.     ROS Review of Systems  Constitutional: Negative.  Negative for diaphoresis, fatigue, fever and unexpected weight change.  HENT: Negative.  Negative for trouble swallowing.   Eyes: Negative for visual disturbance.  Respiratory: Negative.  Negative for apnea, cough, chest tightness, shortness of breath and wheezing.   Cardiovascular: Negative for chest pain, palpitations and leg swelling.  Gastrointestinal: Negative for abdominal pain, constipation, diarrhea, nausea and vomiting.  Endocrine: Negative.   Genitourinary: Negative.  Negative for difficulty urinating.  Musculoskeletal: Positive for neck pain. Negative for arthralgias, back pain and neck stiffness.  Skin: Negative for color change, pallor and rash.  Allergic/Immunologic: Negative.   Neurological: Positive for weakness. Negative for dizziness, light-headedness, numbness and headaches.  Hematological: Negative for adenopathy. Does not bruise/bleed easily.  Psychiatric/Behavioral: Negative.     Objective:  BP 140/90 (BP Location: Left Arm, Patient Position: Sitting, Cuff Size: Normal)   Pulse 95   Temp 98.7 F (37.1 C) (Oral)  Ht 5' (1.524 m)   Wt 161 lb (73 kg)   SpO2 99%   BMI 31.44 kg/m   BP Readings from Last 3 Encounters:  10/31/17 140/90  08/03/17 (!) 190/110  05/04/17 (!) 180/92    Wt Readings from Last 3 Encounters:  10/31/17 161 lb (73 kg)  08/03/17 156 lb 1.3 oz (70.8 kg)  05/04/17 151 lb (68.5 kg)    Physical Exam  Constitutional: She is oriented to person, place, and time. No distress.  HENT:  Mouth/Throat: Oropharynx is clear and moist. No oropharyngeal exudate.  Eyes: Conjunctivae are normal. Left eye exhibits no  discharge. No scleral icterus.  Neck: Normal range of motion. Neck supple. No JVD present. No thyromegaly present.  Cardiovascular: Normal rate, regular rhythm and normal heart sounds. Exam reveals no gallop.  No murmur heard. Pulmonary/Chest: Effort normal and breath sounds normal. No respiratory distress. She has no wheezes. She has no rales.  Abdominal: Soft. Bowel sounds are normal. She exhibits no distension and no mass. There is no tenderness. There is no guarding.  Musculoskeletal: Normal range of motion. She exhibits no edema, tenderness or deformity.       Cervical back: Normal. She exhibits normal range of motion, no tenderness, no bony tenderness, no swelling, no edema and no deformity.  Lymphadenopathy:    She has no cervical adenopathy.  Neurological: She is oriented to person, place, and time. She has normal strength. She displays no atrophy, no tremor and normal reflexes. No cranial nerve deficit or sensory deficit. She exhibits normal muscle tone. She displays a negative Romberg sign. She displays no seizure activity. Coordination and gait normal.  Reflex Scores:      Tricep reflexes are 1+ on the right side and 1+ on the left side.      Bicep reflexes are 1+ on the right side and 1+ on the left side.      Brachioradialis reflexes are 1+ on the right side and 1+ on the left side.      Patellar reflexes are 1+ on the right side and 1+ on the left side.      Achilles reflexes are 1+ on the right side and 1+ on the left side. Skin: Skin is warm and dry. No rash noted. She is not diaphoretic. No erythema. No pallor.  Psychiatric: She has a normal mood and affect. Her behavior is normal. Judgment and thought content normal.  Vitals reviewed.   Lab Results  Component Value Date   WBC 14.5 (H) 10/31/2017   HGB 12.9 10/31/2017   HCT 38.8 10/31/2017   PLT 301.0 10/31/2017   GLUCOSE 108 (H) 10/31/2017   CHOL 152 10/31/2017   TRIG 139.0 10/31/2017   HDL 32.90 (L) 10/31/2017    LDLDIRECT 95.0 08/03/2017   LDLCALC 92 10/31/2017   ALT 5 02/01/2017   AST 14 02/01/2017   NA 137 10/31/2017   K 4.2 10/31/2017   CL 99 10/31/2017   CREATININE 1.02 10/31/2017   BUN 13 10/31/2017   CO2 31 10/31/2017   TSH 3.08 02/01/2017   INR 1.0 04/23/2008   HGBA1C 5.9 05/04/2017    US Renal  Result Date: 08/15/2017 CLINICAL DATA:  Hypertension, worsening renal function EXAM: RENAL/URINARY TRACT ULTRASOUND RENAL DUPLEX DOPPLER ULTRASOUND COMPARISON:  CT 04/24/2008 FINDINGS: Right Kidney: Length: 9.5 cm. Echogenicity within normal limits. No mass or hydronephrosis visualized. Renal vein patent at hilum. Left Kidney: Length: 9.6 cm. Echogenicity within normal limits. No mass or hydronephrosis visualized. Renal vein  patent at hilum. Bladder: Physiologically distended. Bilateral ureteral jets identified. RENAL DUPLEX ULTRASOUND Right Renal Artery Velocities: Origin:  111 cm/sec Mid:  75 cm/sec Hilum:  Of 87 cm/sec Interlobar:  38 cm/sec Arcuate:  28 Cm/sec Left Renal Artery Velocities: Origin:  108 cm/sec Mid:  72 cm/sec Hilum:  82 cm/sec Interlobar:  42 cm/sec Arcuate:  28 cm/sec Aortic Velocity:  70 Cm/sec Right Renal-Aortic Ratios: Origin: 1.58 Mid:  1.07 Hilum: 1.24 Interlobar: 0.54 Arcuate: 0.4 Left Renal-Aortic Ratios: Origin: 1.54 Mid: 1.03 Hilum: 1.17 Interlobar: 0.6 Arcuate: 0.4 1.7 cm shadowing calculus in the dependent aspect of the physiologically distended gallbladder is incidentally noted. IMPRESSION: 1. No evidence of hemodynamically significant renal artery stenosis. If there is continued clinical concern, renal MRA (lower radiation risk, can be performed noncontrast in the setting of renal dysfunction) and CTA ( higher spatial resolution) represent more accurate studies, which are additionally more sensitive to the detection of duplicated renal arteries. 2. Cholelithiasis Electronically Signed   By: Corlis Leak  Hassell M.D.   On: 08/15/2017 11:27   [place] Renal Artery Duplex Complete  Result  Date: 08/15/2017 CLINICAL DATA:  Hypertension, worsening renal function EXAM: RENAL/URINARY TRACT ULTRASOUND RENAL DUPLEX DOPPLER ULTRASOUND COMPARISON:  CT 04/24/2008 FINDINGS: Right Kidney: Length: 9.5 cm. Echogenicity within normal limits. No mass or hydronephrosis visualized. Renal vein patent at hilum. Left Kidney: Length: 9.6 cm. Echogenicity within normal limits. No mass or hydronephrosis visualized. Renal vein patent at hilum. Bladder: Physiologically distended. Bilateral ureteral jets identified. RENAL DUPLEX ULTRASOUND Right Renal Artery Velocities: Origin:  111 cm/sec Mid:  75 cm/sec Hilum:  Of 87 cm/sec Interlobar:  38 cm/sec Arcuate:  28 Cm/sec Left Renal Artery Velocities: Origin:  108 cm/sec Mid:  72 cm/sec Hilum:  82 cm/sec Interlobar:  42 cm/sec Arcuate:  28 cm/sec Aortic Velocity:  70 Cm/sec Right Renal-Aortic Ratios: Origin: 1.58 Mid:  1.07 Hilum: 1.24 Interlobar: 0.54 Arcuate: 0.4 Left Renal-Aortic Ratios: Origin: 1.54 Mid: 1.03 Hilum: 1.17 Interlobar: 0.6 Arcuate: 0.4 1.7 cm shadowing calculus in the dependent aspect of the physiologically distended gallbladder is incidentally noted. IMPRESSION: 1. No evidence of hemodynamically significant renal artery stenosis. If there is continued clinical concern, renal MRA (lower radiation risk, can be performed noncontrast in the setting of renal dysfunction) and CTA ( higher spatial resolution) represent more accurate studies, which are additionally more sensitive to the detection of duplicated renal arteries. 2. Cholelithiasis Electronically Signed   By: Corlis Leak  Hassell M.D.   On: 08/15/2017 11:27    Assessment & Plan:   Emily [name] was seen today for hypertension.  Diagnoses and all orders for this visit:  Essential hypertension-blood pressure is better but still not adequately well controlled.  We will continue the current antihypertensive regimen.  I think the next best thing to do is to treat the vitamin D deficiency. -     Basic metabolic panel;  Future -     VITAMIN D 25 Hydroxy (Vit-D Deficiency, Fractures); Future  Hypertriglyceridemia- Improvement noted.  Will continue the omega-3 fish oils. -     Lipid panel; Future  Hyperlipidemia with target LDL less than 70- She has achieved her LDL goal and is doing well on the statin. -     Lipid panel; Future -     Ferritin; Future  Other iron deficiency anemia- Improvement noted -     CBC with Differential/Platelet; Future -     IBC panel; Future  Dietary folate deficiency anemia- Improvement noted -     CBC with Differential/Platelet;  Future -     Folate; Future -     Vitamin B12; Future  Screen for colon cancer -     Ambulatory referral to Gastroenterology  Vitamin D deficiency -     VITAMIN D 25 Hydroxy (Vit-D Deficiency, Fractures); Future -     Cholecalciferol 2000 units TABS; Take 1 tablet (2,000 Units total) by mouth daily.  Visit for screening mammogram -     MM DIGITAL SCREENING BILATERAL; Future  Cervical spinal stenosis- She has worsening radicular neck pain but her neurologic exam is intact.  She will see a neurosurgeon in the [place] area to see if she needs to have another surgical procedure.  For now, will continue the current dose of methadone for relief of the pain. -     methadone (DOLOPHINE) 10 MG tablet; Take a total of 30 mg [3 tablets] TID -     Ambulatory referral to Neurosurgery  Chronic pain syndrome -     methadone (DOLOPHINE) 10 MG tablet; Take a total of 30 mg [3 tablets] TID  B12 deficiency -     cyanocobalamin 2000 MCG tablet; Take 1 tablet (2,000 mcg total) by mouth daily.   I have discontinued Emily [name]'s Cholecalciferol. I am also having her start on cyanocobalamin and Cholecalciferol. Additionally, I am having her maintain her loperamide, sertraline, folic acid, FERRALET 90, atorvastatin, gabapentin, chlorthalidone, nebivolol, olmesartan, amLODipine, potassium chloride SA, omega-3 acid ethyl esters, and methadone.  Meds  ordered this encounter  Medications  . methadone (DOLOPHINE) 10 MG tablet    Sig: Take a total of 30 mg [3 tablets] TID    Dispense:  270 tablet    Refill:  0  . cyanocobalamin 2000 MCG tablet    Sig: Take 1 tablet (2,000 mcg total) by mouth daily.    Dispense:  90 tablet    Refill:  1  . Cholecalciferol 2000 units TABS    Sig: Take 1 tablet (2,000 Units total) by mouth daily.    Dispense:  90 tablet    Refill:  1     Follow-up: Return in about 4 months (around 03/02/2018).  Sanda Linger, MD

## 2017-11-01 ENCOUNTER — Encounter: Payer: Self-pay | Admitting: Internal Medicine

## 2017-11-01 ENCOUNTER — Other Ambulatory Visit: Payer: Self-pay | Admitting: Internal Medicine

## 2017-11-01 DIAGNOSIS — D529 Folate deficiency anemia, unspecified: Secondary | ICD-10-CM

## 2017-11-01 DIAGNOSIS — D509 Iron deficiency anemia, unspecified: Secondary | ICD-10-CM

## 2017-11-01 LAB — VITAMIN B12: VITAMIN B 12: 272 pg/mL (ref 211–911)

## 2017-11-01 LAB — CBC WITH DIFFERENTIAL/PLATELET
BASOS ABS: 0.1 10*3/uL (ref 0.0–0.1)
Basophils Relative: 0.9 % (ref 0.0–3.0)
Eosinophils Absolute: 0 10*3/uL (ref 0.0–0.7)
Eosinophils Relative: 0.2 % (ref 0.0–5.0)
HEMATOCRIT: 38.8 % (ref 36.0–46.0)
HEMOGLOBIN: 12.9 g/dL (ref 12.0–15.0)
LYMPHS PCT: 11.3 % — AB (ref 12.0–46.0)
Lymphs Abs: 1.6 10*3/uL (ref 0.7–4.0)
MCHC: 33.4 g/dL (ref 30.0–36.0)
MCV: 83.1 fl (ref 78.0–100.0)
MONOS PCT: 5.1 % (ref 3.0–12.0)
Monocytes Absolute: 0.7 10*3/uL (ref 0.1–1.0)
Neutro Abs: 12 10*3/uL — ABNORMAL HIGH (ref 1.4–7.7)
Neutrophils Relative %: 82.5 % — ABNORMAL HIGH (ref 43.0–77.0)
Platelets: 301 10*3/uL (ref 150.0–400.0)
RBC: 4.67 Mil/uL (ref 3.87–5.11)
RDW: 15.2 % (ref 11.5–15.5)
WBC: 14.5 10*3/uL — AB (ref 4.0–10.5)

## 2017-11-01 LAB — VITAMIN D 25 HYDROXY (VIT D DEFICIENCY, FRACTURES): VITD: 23.92 ng/mL — AB (ref 30.00–100.00)

## 2017-11-01 LAB — FOLATE: Folate: 8 ng/mL (ref >5.9)

## 2017-11-01 LAB — FERRITIN: FERRITIN: 23 ng/mL (ref 10.0–291.0)

## 2017-11-01 MED ORDER — CYANOCOBALAMIN 2000 MCG PO TABS: 2000.0000 ug | ORAL_TABLET | Freq: Every day | ORAL | 1 refills | Status: DC

## 2017-11-01 MED ORDER — FERRALET 90 90-1 MG PO TABS: 1.0000 | ORAL_TABLET | Freq: Every day | ORAL | 1 refills | Status: DC

## 2017-11-01 MED ORDER — CHOLECALCIFEROL 50 MCG (2000 UT) PO TABS: 1.0000 | ORAL_TABLET | Freq: Every day | ORAL | 1 refills | Status: DC

## 2017-11-18 ENCOUNTER — Encounter: Payer: Self-pay | Admitting: Internal Medicine

## 2017-11-29 ENCOUNTER — Other Ambulatory Visit: Payer: Self-pay | Admitting: Internal Medicine

## 2017-11-29 ENCOUNTER — Telehealth: Payer: Self-pay

## 2017-11-29 DIAGNOSIS — M4802 Spinal stenosis, cervical region: Secondary | ICD-10-CM

## 2017-11-29 DIAGNOSIS — G894 Chronic pain syndrome: Secondary | ICD-10-CM

## 2017-11-29 MED ORDER — METHADONE HCL 10 MG PO TABS
ORAL_TABLET | ORAL | 0 refills | Status: DC
Start: 2017-11-29 — End: 2017-12-28

## 2017-11-29 NOTE — Telephone Encounter (Signed)
Reviewed medication list and methadone 10 mg has been decreased to #270 from #450.   Called Ryan with [facility] and informed of same.

## 2017-11-29 NOTE — Telephone Encounter (Signed)
Copied from CRM [phone]. Topic: General - Other >> Nov 29, 2017 11:59 AM Percival SpanishKennedy, Cheryl W wrote:  Aly[facility]yan [facility] [place] said he spoke to some one last year and was told Dr Yetta BarreJones was going to weaning pt off of the below med. He said he is seeing that is not the case and is asking if this is going to still happen . Would need a call back [phone]. He said [employer] is coming in the pharmacy and he need to cover his pharmacy    methadone (DOLOPHINE) 10 MG tablet

## 2017-12-28 ENCOUNTER — Other Ambulatory Visit: Payer: Self-pay | Admitting: Internal Medicine

## 2017-12-28 DIAGNOSIS — M4802 Spinal stenosis, cervical region: Secondary | ICD-10-CM

## 2017-12-28 DIAGNOSIS — G894 Chronic pain syndrome: Secondary | ICD-10-CM

## 2017-12-28 MED ORDER — METHADONE HCL 10 MG PO TABS
ORAL_TABLET | ORAL | 0 refills | Status: DC
Start: 2017-12-28 — End: 2018-01-26

## 2018-01-10 ENCOUNTER — Encounter: Payer: Self-pay | Admitting: Internal Medicine

## 2018-01-11 ENCOUNTER — Encounter: Payer: Self-pay | Admitting: Internal Medicine

## 2018-01-24 ENCOUNTER — Other Ambulatory Visit: Payer: No Typology Code available for payment source

## 2018-01-24 ENCOUNTER — Ambulatory Visit (INDEPENDENT_AMBULATORY_CARE_PROVIDER_SITE_OTHER): Payer: Self-pay | Admitting: Internal Medicine

## 2018-01-24 ENCOUNTER — Encounter: Payer: Self-pay | Admitting: Internal Medicine

## 2018-01-24 ENCOUNTER — Other Ambulatory Visit (HOSPITAL_COMMUNITY)
Admission: RE | Admit: 2018-01-24 | Discharge: 2018-01-24 | Disposition: A | Discharge status: Home / Self Care | Payer: No Typology Code available for payment source | Source: Ambulatory Visit | Attending: Internal Medicine | Admitting: Internal Medicine

## 2018-01-24 ENCOUNTER — Other Ambulatory Visit (INDEPENDENT_AMBULATORY_CARE_PROVIDER_SITE_OTHER): Payer: Self-pay

## 2018-01-24 VITALS — BP 190/110 | HR 102 | Temp 98.1°F | Resp 16 | Ht 60.0 in | Wt 165.2 lb

## 2018-01-24 DIAGNOSIS — I10 Essential (primary) hypertension: Secondary | ICD-10-CM

## 2018-01-24 DIAGNOSIS — Z1211 Encounter for screening for malignant neoplasm of colon: Secondary | ICD-10-CM

## 2018-01-24 DIAGNOSIS — R0789 Other chest pain: Secondary | ICD-10-CM

## 2018-01-24 DIAGNOSIS — Z124 Encounter for screening for malignant neoplasm of cervix: Secondary | ICD-10-CM | POA: Insufficient documentation

## 2018-01-24 DIAGNOSIS — E781 Pure hyperglyceridemia: Secondary | ICD-10-CM

## 2018-01-24 DIAGNOSIS — G894 Chronic pain syndrome: Secondary | ICD-10-CM

## 2018-01-24 DIAGNOSIS — K760 Fatty (change of) liver, not elsewhere classified: Secondary | ICD-10-CM

## 2018-01-24 DIAGNOSIS — Z1231 Encounter for screening mammogram for malignant neoplasm of breast: Secondary | ICD-10-CM

## 2018-01-24 DIAGNOSIS — M4802 Spinal stenosis, cervical region: Secondary | ICD-10-CM

## 2018-01-24 DIAGNOSIS — Z0000 Encounter for general adult medical examination without abnormal findings: Secondary | ICD-10-CM

## 2018-01-24 DIAGNOSIS — Z79891 Long term (current) use of opiate analgesic: Secondary | ICD-10-CM

## 2018-01-24 DIAGNOSIS — E785 Hyperlipidemia, unspecified: Secondary | ICD-10-CM

## 2018-01-24 LAB — CBC WITH DIFFERENTIAL/PLATELET
BASOS PCT: 0.9 % (ref 0.0–3.0)
Basophils Absolute: 0.1 10*3/uL (ref 0.0–0.1)
EOS ABS: 0.1 10*3/uL (ref 0.0–0.7)
EOS PCT: 1.3 % (ref 0.0–5.0)
HCT: 40.7 % (ref 36.0–46.0)
Hemoglobin: 13.6 g/dL (ref 12.0–15.0)
Lymphocytes Relative: 19.3 % (ref 12.0–46.0)
Lymphs Abs: 2 10*3/uL (ref 0.7–4.0)
MCHC: 33.6 g/dL (ref 30.0–36.0)
MCV: 84 fl (ref 78.0–100.0)
MONO ABS: 0.6 10*3/uL (ref 0.1–1.0)
Monocytes Relative: 6.1 % (ref 3.0–12.0)
Neutro Abs: 7.5 10*3/uL (ref 1.4–7.7)
Neutrophils Relative %: 72.4 % (ref 43.0–77.0)
PLATELETS: 225 10*3/uL (ref 150.0–400.0)
RBC: 4.84 Mil/uL (ref 3.87–5.11)
RDW: 15.8 % — AB (ref 11.5–15.5)
WBC: 10.3 10*3/uL (ref 4.0–10.5)

## 2018-01-24 LAB — URINALYSIS, ROUTINE W REFLEX MICROSCOPIC
BILIRUBIN URINE: NEGATIVE
KETONES UR: NEGATIVE
LEUKOCYTES UA: NEGATIVE
NITRITE: NEGATIVE
PH: 7.5 (ref 5.0–8.0)
RBC / HPF: NONE SEEN (ref 0–?)
Specific Gravity, Urine: 1.01 (ref 1.000–1.030)
TOTAL PROTEIN, URINE-UPE24: NEGATIVE
Urine Glucose: NEGATIVE
Urobilinogen, UA: 0.2 (ref 0.0–1.0)

## 2018-01-24 LAB — COMPREHENSIVE METABOLIC PANEL
ALBUMIN: 4.4 g/dL (ref 3.5–5.2)
ALT: 7 U/L (ref 0–35)
AST: 18 U/L (ref 0–37)
Alkaline Phosphatase: 84 U/L (ref 39–117)
BUN: 10 mg/dL (ref 6–23)
CHLORIDE: 100 meq/L (ref 96–112)
CO2: 27 meq/L (ref 19–32)
CREATININE: 1.09 mg/dL (ref 0.40–1.20)
Calcium: 9.9 mg/dL (ref 8.4–10.5)
GFR: 55.77 mL/min — ABNORMAL LOW (ref >60.00)
Glucose, Bld: 105 mg/dL — ABNORMAL HIGH (ref 70–99)
POTASSIUM: 4.2 meq/L (ref 3.5–5.1)
SODIUM: 138 meq/L (ref 135–145)
Total Bilirubin: 0.3 mg/dL (ref 0.2–1.2)
Total Protein: 7.9 g/dL (ref 6.0–8.3)

## 2018-01-24 LAB — HM PAP SMEAR

## 2018-01-24 LAB — TRIGLYCERIDES: Triglycerides: 355 mg/dL — ABNORMAL HIGH (ref 0.0–149.0)

## 2018-01-24 LAB — TROPONIN I: TNIDX: 0 ug/L (ref 0.00–0.06)

## 2018-01-24 MED ORDER — ATORVASTATIN CALCIUM 10 MG PO TABS: 10.0000 mg | ORAL_TABLET | Freq: Every day | ORAL | 1 refills | Status: DC

## 2018-01-24 MED ORDER — NALOXONE HCL 4 MG/0.1ML NA LIQD: 1.0000 | Freq: Once | NASAL | 2 refills | Status: DC

## 2018-01-24 MED ORDER — NALOXONE HCL 4 MG/0.1ML NA LIQD: 1.0000 | Freq: Once | NASAL | 2 refills | Status: AC

## 2018-01-24 MED ORDER — OLMESARTAN MEDOXOMIL 40 MG PO TABS: 40.0000 mg | ORAL_TABLET | Freq: Every day | ORAL | 1 refills | Status: DC

## 2018-01-24 MED ORDER — CHLORTHALIDONE 25 MG PO TABS: 25.0000 mg | ORAL_TABLET | Freq: Every day | ORAL | 1 refills | Status: DC

## 2018-01-24 MED ORDER — NEBIVOLOL HCL 10 MG PO TABS: 10.0000 mg | ORAL_TABLET | Freq: Every day | ORAL | 0 refills | Status: DC

## 2018-01-24 NOTE — Patient Instructions (Signed)

## 2018-01-24 NOTE — Progress Notes (Signed)
Subjective:  Patient ID: Emily [name], female    DOB: 1966/04/07  Age: 52 y.o. MRN: [id number]  CC: Hypertension and Annual Exam   HPI VARVARA LEGAULT presents for a CPX.  She complains of worsening neck pain that she describes as a throbbing and shooting sensation that radiates into her upper extremities.  She also complains of numbness in both fingers.  She is willing to see a neurosurgeon about this.  She also requests a refill of methadone for pain control.  She tells me 2 months ago she had an episode of chest pressure.  She attributed the symptoms to amlodipine so she has cut the dose in half.  She initially told me she was taking all of her other antihypertensives.  She does not understand why her blood pressure is not well controlled today.  Since that episode 2 months ago she has had no more episodes of CP, DOE, diaphoresis, shortness of breath, fatigue, or edema.  Outpatient Medications Prior to Visit  Medication Sig Dispense Refill  . amLODipine (NORVASC) 10 MG tablet Take 1 tablet (10 mg total) by mouth daily. 90 tablet 1  . Cholecalciferol 2000 units TABS Take 1 tablet (2,000 Units total) by mouth daily. 90 tablet 1  . cyanocobalamin 2000 MCG tablet Take 1 tablet (2,000 mcg total) by mouth daily. 90 tablet 1  . Fe Cbn-Fe Gluc-FA-B12-C-DSS (FERRALET 90) 90-1 MG TABS Take 1 tablet by mouth daily. 90 each 1  . folic acid (FOLVITE) 1 MG tablet Take 1 tablet (1 mg total) by mouth daily. 90 tablet 3  . omega-3 acid ethyl esters (LOVAZA) 1 g capsule Take 2 capsules (2 g total) by mouth 2 (two) times daily. 120 capsule 5  . potassium chloride SA (K-DUR,KLOR-CON) 20 MEQ tablet Take 1 tablet (20 mEq total) by mouth 3 (three) times daily. 270 tablet 1  . sertraline (ZOLOFT) 100 MG tablet TAKE 1 TABLET IN THE MORNING AND 1/2 TAB IN THE EVENING. 45 tablet 2  . atorvastatin (LIPITOR) 10 MG tablet Take 1 tablet (10 mg total) by mouth daily. 90 tablet 3  . chlorthalidone (HYGROTON) 25  MG tablet Take 1 tablet (25 mg total) by mouth daily. 90 tablet 1  . gabapentin (NEURONTIN) 100 MG capsule TAKE 1 CAPSULE BY MOUTH THREE TIMES DAILY 90 capsule 3  . loperamide (IMODIUM A-D) 2 MG tablet Take 2 mg by mouth as needed.      . methadone (DOLOPHINE) 10 MG tablet Take a total of 30 mg [3 tablets] TID 270 tablet 0  . nebivolol (BYSTOLIC) 10 MG tablet Take 1 tablet (10 mg total) by mouth daily. 90 tablet 1  . olmesartan (BENICAR) 40 MG tablet Take 1 tablet (40 mg total) by mouth daily. 90 tablet 1   No facility-administered medications prior to visit.     ROS Review of Systems  Constitutional: Negative.  Negative for appetite change, diaphoresis and fatigue.  HENT: Negative.  Negative for trouble swallowing.   Eyes: Negative for visual disturbance.  Respiratory: Positive for chest tightness. Negative for cough, choking, shortness of breath and wheezing.   Cardiovascular: Negative for chest pain, palpitations and leg swelling.  Gastrointestinal: Negative for abdominal pain, constipation, diarrhea, nausea and vomiting.  Endocrine: Negative.   Genitourinary: Negative.  Negative for decreased urine volume, difficulty urinating, dysuria, genital sores, pelvic pain and vaginal discharge.  Musculoskeletal: Positive for neck pain. Negative for arthralgias, back pain and myalgias.  Skin: Negative.  Negative for color change.  Allergic/Immunologic: Negative.   Neurological: Positive for numbness. Negative for dizziness, seizures, speech difficulty, weakness, light-headedness and headaches.  Hematological: Negative for adenopathy. Does not bruise/bleed easily.  Psychiatric/Behavioral: Negative for decreased concentration and dysphoric mood. The patient is not nervous/anxious.     Objective:  BP (!) 190/110 (BP Location: Left Arm, Patient Position: Sitting, Cuff Size: Normal)   Pulse (!) 102   Temp 98.1 F (36.7 C) (Oral)   Resp 16   Ht 5' (1.524 m)   Wt 165 lb 4 oz (75 kg)   SpO2 99%    BMI 32.27 kg/m   BP Readings from Last 3 Encounters:  01/24/18 (!) 190/110  10/31/17 140/90  08/03/17 (!) 190/110    Wt Readings from Last 3 Encounters:  01/24/18 165 lb 4 oz (75 kg)  10/31/17 161 lb (73 kg)  08/03/17 156 lb 1.3 oz (70.8 kg)    Physical Exam  Constitutional: She is oriented to person, place, and time. No distress.  HENT:  Mouth/Throat: Oropharynx is clear and moist. No oropharyngeal exudate.  Eyes: Conjunctivae are normal. No scleral icterus.  Neck: Normal range of motion. Neck supple. No JVD present. No thyromegaly present.  Cardiovascular: Regular rhythm, S1 normal, S2 normal and normal pulses. Tachycardia present. PMI is not displaced. Exam reveals no gallop.  No murmur heard. EKG ---  Sinus  Rhythm  -  Nonspecific T-abnormality.   ABNORMAL - no change from the prior EKG  Pulmonary/Chest: Effort normal and breath sounds normal. No respiratory distress. She has no wheezes. She has no rales. Right breast exhibits no inverted nipple, no mass, no nipple discharge, no skin change and no tenderness. Left breast exhibits no inverted nipple, no mass, no nipple discharge, no skin change and no tenderness. Breasts are symmetrical.  Abdominal: Soft. Normal appearance and bowel sounds are normal. She exhibits no mass. There is no hepatosplenomegaly. There is no tenderness. No hernia. Hernia confirmed negative in the right inguinal area and confirmed negative in the left inguinal area.  Genitourinary: Rectum normal, vagina normal and uterus normal. Rectal exam shows no external hemorrhoid, no internal hemorrhoid, no fissure, no mass, no tenderness, anal tone normal and guaiac negative stool. Pelvic exam was performed with patient supine. There is no rash, tenderness, lesion or injury on the right labia. There is no rash, tenderness, lesion or injury on the left labia. Uterus is not deviated, not enlarged, not fixed and not tender. Cervix exhibits no motion tenderness, no  discharge and no friability. Right adnexum displays no mass, no tenderness and no fullness. Left adnexum displays no mass, no tenderness and no fullness. No erythema, tenderness or bleeding in the vagina. No foreign body in the vagina. No signs of injury around the vagina. No vaginal discharge found.  Musculoskeletal: Normal range of motion. She exhibits no edema, tenderness or deformity.  Lymphadenopathy:    She has no cervical adenopathy. No inguinal adenopathy noted on the right or left side.  Neurological: She is alert and oriented to person, place, and time. She displays no atrophy and no tremor. No cranial nerve deficit or sensory deficit. She exhibits normal muscle tone. She displays a negative Romberg sign. She displays no seizure activity. Coordination and gait normal.  Reflex Scores:      Tricep reflexes are 1+ on the right side and 1+ on the left side.      Bicep reflexes are 1+ on the right side and 1+ on the left side.      Brachioradialis  reflexes are 1+ on the right side and 1+ on the left side.      Patellar reflexes are 1+ on the right side and 1+ on the left side.      Achilles reflexes are 1+ on the right side and 1+ on the left side. Skin: Skin is warm and dry. She is not diaphoretic. No pallor.  Psychiatric: She has a normal mood and affect. Her behavior is normal. Judgment and thought content normal.  Vitals reviewed.   Lab Results  Component Value Date   WBC 10.3 01/24/2018   HGB 13.6 01/24/2018   HCT 40.7 01/24/2018   PLT 225.0 01/24/2018   GLUCOSE 105 (H) 01/24/2018   CHOL 152 10/31/2017   TRIG 355.0 (H) 01/24/2018   HDL 32.90 (L) 10/31/2017   LDLDIRECT 95.0 08/03/2017   LDLCALC 92 10/31/2017   ALT 7 01/24/2018   AST 18 01/24/2018   NA 138 01/24/2018   K 4.2 01/24/2018   CL 100 01/24/2018   CREATININE 1.09 01/24/2018   BUN 10 01/24/2018   CO2 27 01/24/2018   TSH 3.08 02/01/2017   INR 1.0 04/23/2008   HGBA1C 5.9 05/04/2017    US Renal  Result Date:  08/15/2017 CLINICAL DATA:  Hypertension, worsening renal function EXAM: RENAL/URINARY TRACT ULTRASOUND RENAL DUPLEX DOPPLER ULTRASOUND COMPARISON:  CT 04/24/2008 FINDINGS: Right Kidney: Length: 9.5 cm. Echogenicity within normal limits. No mass or hydronephrosis visualized. Renal vein patent at hilum. Left Kidney: Length: 9.6 cm. Echogenicity within normal limits. No mass or hydronephrosis visualized. Renal vein patent at hilum. Bladder: Physiologically distended. Bilateral ureteral jets identified. RENAL DUPLEX ULTRASOUND Right Renal Artery Velocities: Origin:  111 cm/sec Mid:  75 cm/sec Hilum:  Of 87 cm/sec Interlobar:  38 cm/sec Arcuate:  28 Cm/sec Left Renal Artery Velocities: Origin:  108 cm/sec Mid:  72 cm/sec Hilum:  82 cm/sec Interlobar:  42 cm/sec Arcuate:  28 cm/sec Aortic Velocity:  70 Cm/sec Right Renal-Aortic Ratios: Origin: 1.58 Mid:  1.07 Hilum: 1.24 Interlobar: 0.54 Arcuate: 0.4 Left Renal-Aortic Ratios: Origin: 1.54 Mid: 1.03 Hilum: 1.17 Interlobar: 0.6 Arcuate: 0.4 1.7 cm shadowing calculus in the dependent aspect of the physiologically distended gallbladder is incidentally noted. IMPRESSION: 1. No evidence of hemodynamically significant renal artery stenosis. If there is continued clinical concern, renal MRA (lower radiation risk, can be performed noncontrast in the setting of renal dysfunction) and CTA ( higher spatial resolution) represent more accurate studies, which are additionally more sensitive to the detection of duplicated renal arteries. 2. Cholelithiasis Electronically Signed   By: Lucrezia Europe M.D.   On: 08/15/2017 11:27   US Renal Artery Duplex Complete  Result Date: 08/15/2017 CLINICAL DATA:  Hypertension, worsening renal function EXAM: RENAL/URINARY TRACT ULTRASOUND RENAL DUPLEX DOPPLER ULTRASOUND COMPARISON:  CT 04/24/2008 FINDINGS: Right Kidney: Length: 9.5 cm. Echogenicity within normal limits. No mass or hydronephrosis visualized. Renal vein patent at hilum. Left Kidney: Length:  9.6 cm. Echogenicity within normal limits. No mass or hydronephrosis visualized. Renal vein patent at hilum. Bladder: Physiologically distended. Bilateral ureteral jets identified. RENAL DUPLEX ULTRASOUND Right Renal Artery Velocities: Origin:  111 cm/sec Mid:  75 cm/sec Hilum:  Of 87 cm/sec Interlobar:  38 cm/sec Arcuate:  28 Cm/sec Left Renal Artery Velocities: Origin:  108 cm/sec Mid:  72 cm/sec Hilum:  82 cm/sec Interlobar:  42 cm/sec Arcuate:  28 cm/sec Aortic Velocity:  70 Cm/sec Right Renal-Aortic Ratios: Origin: 1.58 Mid:  1.07 Hilum: 1.24 Interlobar: 0.54 Arcuate: 0.4 Left Renal-Aortic Ratios: Origin: 1.54 Mid: 1.03 Hilum:  1.17 Interlobar: 0.6 Arcuate: 0.4 1.7 cm shadowing calculus in the dependent aspect of the physiologically distended gallbladder is incidentally noted. IMPRESSION: 1. No evidence of hemodynamically significant renal artery stenosis. If there is continued clinical concern, renal MRA (lower radiation risk, can be performed noncontrast in the setting of renal dysfunction) and CTA ( higher spatial resolution) represent more accurate studies, which are additionally more sensitive to the detection of duplicated renal arteries. 2. Cholelithiasis Electronically Signed   By: Lucrezia Europe M.D.   On: 08/15/2017 11:27    Assessment & Plan:   Najwa was seen today for hypertension and annual exam.  Diagnoses and all orders for this visit:  Essential hypertension- Her blood pressure is not well controlled.  I will screen her for primary aldosteronism and catecholamine excess.  I spoke to the 2 pharmacies that she uses and they confirmed that she has not picked up any antihypertensives over the last 6 months.  I approached her about this and she confessed that she has not been taking her antihypertensives.  I have asked her to restart the ARB, beta-blocker, and chlorthalidone.  I do not think amlodipine is a good antihypertensive for her so i have asked her to stop taking it.  Otherwise, her  labs are negative for secondary causes or endorgan damage. -     Urinalysis, Routine w reflex microscopic; Future -     Metanephrines, plasma; Future -     Aldosterone + renin activity w/ ratio; Future -     Comprehensive metabolic panel; Future -     CBC with Differential/Platelet; Future -     olmesartan (BENICAR) 40 MG tablet; Take 1 tablet (40 mg total) by mouth daily. -     nebivolol (BYSTOLIC) 10 MG tablet; Take 1 tablet (10 mg total) by mouth daily. -     chlorthalidone (HYGROTON) 25 MG tablet; Take 1 tablet (25 mg total) by mouth daily.  Fatty liver-liver enzymes are normal. -     Comprehensive metabolic panel; Future  Hypertriglyceridemia-glycerides have improved some.  We will continue the fish oil supplement. -     Triglycerides; Future  Cervical spinal stenosis -     Ambulatory referral to Neurosurgery -     methadone (DOLOPHINE) 10 MG tablet; Take a total of 30 mg [3 tablets] TID  Screen for colon cancer -     Ambulatory referral to Gastroenterology  Visit for screening mammogram -     MM DIGITAL SCREENING BILATERAL; Future  Long-term current use of opiate analgesic -     Pain Mgmt, Profile 8 w/Conf, U; Future -     Discontinue: naloxone (NARCAN) nasal spray 4 mg/0.1 mL; Place 1 spray into the nose once for 1 dose. -     naloxone (NARCAN) nasal spray 4 mg/0.1 mL; Place 1 spray into the nose once for 1 dose. -     Pain Mgmt, Methadone Conf w/mM, U  Chest pressure- Her EKG is not suspicious for ischemia and her troponin is negative.  She will let me know if she has any recurrent episodes of chest pressure and I will prove pursue a more in-depth evaluation of ischemia. -     EKG 12-Lead -     Troponin I; Future  Hyperlipidemia with target LDL less than 70- She has not achieved her LDL goal.  She has not been taking the statin.  She has an elevated ASCVD risk score so I have asked her to start taking a statin for  CV risk reduction. -     Discontinue: atorvastatin  (LIPITOR) 10 MG tablet; Take 1 tablet (10 mg total) by mouth daily. -     atorvastatin (LIPITOR) 10 MG tablet; Take 1 tablet (10 mg total) by mouth daily.  Cervical cancer screening -     Cytology - PAP  Chronic pain syndrome -     methadone (DOLOPHINE) 10 MG tablet; Take a total of 30 mg [3 tablets] TID   I have discontinued Sharyn Lull E. Gilbert's loperamide and gabapentin. I am also having her maintain her sertraline, folic acid, amLODipine, potassium chloride SA, omega-3 acid ethyl esters, cyanocobalamin, Cholecalciferol, FERRALET 90, atorvastatin, olmesartan, nebivolol, chlorthalidone, and methadone.  Meds ordered this encounter  Medications  . DISCONTD: naloxone (NARCAN) nasal spray 4 mg/0.1 mL    Sig: Place 1 spray into the nose once for 1 dose.    Dispense:  2 kit    Refill:  2  . DISCONTD: atorvastatin (LIPITOR) 10 MG tablet    Sig: Take 1 tablet (10 mg total) by mouth daily.    Dispense:  90 tablet    Refill:  1  . atorvastatin (LIPITOR) 10 MG tablet    Sig: Take 1 tablet (10 mg total) by mouth daily.    Dispense:  90 tablet    Refill:  1  . naloxone (NARCAN) nasal spray 4 mg/0.1 mL    Sig: Place 1 spray into the nose once for 1 dose.    Dispense:  2 kit    Refill:  2  . olmesartan (BENICAR) 40 MG tablet    Sig: Take 1 tablet (40 mg total) by mouth daily.    Dispense:  90 tablet    Refill:  1  . nebivolol (BYSTOLIC) 10 MG tablet    Sig: Take 1 tablet (10 mg total) by mouth daily.    Dispense:  84 tablet    Refill:  0  . chlorthalidone (HYGROTON) 25 MG tablet    Sig: Take 1 tablet (25 mg total) by mouth daily.    Dispense:  90 tablet    Refill:  1  . methadone (DOLOPHINE) 10 MG tablet    Sig: Take a total of 30 mg [3 tablets] TID    Dispense:  270 tablet    Refill:  0     Follow-up: Return in about 2 months (around 03/26/2018).  Scarlette Calico, MD

## 2018-01-26 ENCOUNTER — Telehealth: Payer: Self-pay | Admitting: Internal Medicine

## 2018-01-26 ENCOUNTER — Encounter: Payer: Self-pay | Admitting: Internal Medicine

## 2018-01-26 ENCOUNTER — Other Ambulatory Visit: Payer: Self-pay | Admitting: Internal Medicine

## 2018-01-26 DIAGNOSIS — M4802 Spinal stenosis, cervical region: Secondary | ICD-10-CM

## 2018-01-26 DIAGNOSIS — G894 Chronic pain syndrome: Secondary | ICD-10-CM

## 2018-01-26 LAB — CYTOLOGY - PAP
CHLAMYDIA, DNA PROBE: NEGATIVE
Diagnosis: NEGATIVE
HPV: NOT DETECTED
Neisseria Gonorrhea: NEGATIVE

## 2018-01-26 MED ORDER — METHADONE HCL 10 MG PO TABS: ORAL_TABLET | ORAL | 0 refills | Status: DC

## 2018-01-26 NOTE — Telephone Encounter (Signed)
Check [place] registry last filled 12/28/2017.Marland KitchenJohny [name]

## 2018-01-26 NOTE — Telephone Encounter (Signed)
Copied from [place] [phone]. Topic: Quick Communication - See Telephone Encounter >> 02/15/2018  1:49 PM Boyd Kerbs wrote: CRM for notification. See Telephone encounter for: Feb 15, 2018.  methadone (DOLOPHINE) 10 MG tablet 270 tablet 0 02/15/18   Sig: Take a total of 30 mg [3 tablets] TID  Sent to pharmacy as: methadone (DOLOPHINE) 10 MG tablet  Earliest Fill Date: February 15, 2018    Needing Diagnostic codes [facility] - [address] S[address], [place] AT [place] [address] [place] [address] Phone: [phone] Fax: [phone]

## 2018-01-27 NOTE — Telephone Encounter (Signed)
MD sent refill to requested pharmacy.Marland KitchenJohny Chess

## 2018-01-28 LAB — PAIN MGMT, PROFILE 8 W/CONF, U
6 ACETYLMORPHINE: NEGATIVE ng/mL (ref <10)
Alcohol Metabolites: NEGATIVE ng/mL (ref <500)
Amphetamines: NEGATIVE ng/mL (ref <500)
Benzodiazepines: NEGATIVE ng/mL (ref <100)
Buprenorphine, Urine: NEGATIVE ng/mL (ref <5)
Cocaine Metabolite: NEGATIVE ng/mL (ref <150)
Creatinine: 22.9 mg/dL
MDMA: NEGATIVE ng/mL (ref <500)
Marijuana Metabolite: NEGATIVE ng/mL (ref <20)
OPIATES: NEGATIVE ng/mL (ref <100)
OXYCODONE: NEGATIVE ng/mL (ref <100)
Oxidant: NEGATIVE ug/mL (ref <200)
pH: 6.78 (ref 4.5–9.0)

## 2018-01-28 LAB — ALDOSTERONE + RENIN ACTIVITY W/ RATIO
ALDO / PRA Ratio: 31.3 Ratio — ABNORMAL HIGH (ref 0.9–28.9)
ALDOSTERONE: 5 ng/dL
RENIN ACTIVITY: 0.16 ng/mL/h — AB (ref 0.25–5.82)

## 2018-01-28 LAB — PAIN MGMT, METHADONE CONF W/MM, U
EDDP: 4461 ng/mL — ABNORMAL HIGH (ref <100)
METHADONE METABOLITE: POSITIVE ng/mL — AB (ref <100)
Methadone: 3010 ng/mL — ABNORMAL HIGH (ref <100)

## 2018-01-28 LAB — METANEPHRINES, PLASMA
METANEPHRINE FREE: 51 pg/mL (ref <=57)
NORMETANEPHRINE FREE: 142 pg/mL (ref <=148)
TOTAL METANEPHRINES-PLASMA: 193 pg/mL (ref <=205)

## 2018-01-28 LAB — TEST AUTHORIZATION

## 2018-02-02 ENCOUNTER — Ambulatory Visit: Payer: Self-pay | Admitting: Internal Medicine

## 2018-02-22 ENCOUNTER — Other Ambulatory Visit: Payer: Self-pay | Admitting: Internal Medicine

## 2018-02-22 DIAGNOSIS — M4802 Spinal stenosis, cervical region: Secondary | ICD-10-CM

## 2018-02-22 DIAGNOSIS — G894 Chronic pain syndrome: Secondary | ICD-10-CM

## 2018-02-23 ENCOUNTER — Encounter: Payer: Self-pay | Admitting: Internal Medicine

## 2018-02-24 MED ORDER — METHADONE HCL 10 MG PO TABS: ORAL_TABLET | ORAL | 0 refills | Status: DC

## 2018-02-24 NOTE — Telephone Encounter (Signed)
Pt calling to check status on her medication being refilled. States she is now out of the medication and states last time she ran out she was very sick and went in the hospital. [facility] [phone] - [place], [place] - [address] AT [facility]  Please call pt to let her know if this can be called in.

## 2018-02-24 NOTE — Telephone Encounter (Signed)
Informed patient

## 2018-03-23 ENCOUNTER — Other Ambulatory Visit: Payer: Self-pay | Admitting: Internal Medicine

## 2018-03-23 DIAGNOSIS — M4802 Spinal stenosis, cervical region: Secondary | ICD-10-CM

## 2018-03-23 DIAGNOSIS — G894 Chronic pain syndrome: Secondary | ICD-10-CM

## 2018-03-23 NOTE — Telephone Encounter (Signed)
Check [place] registry last filled 02/24/2018.Marland Kitchen.Raechel Chute/lmb

## 2018-03-25 MED ORDER — METHADONE HCL 10 MG PO TABS: ORAL_TABLET | ORAL | 0 refills | Status: DC

## 2018-04-25 ENCOUNTER — Ambulatory Visit (INDEPENDENT_AMBULATORY_CARE_PROVIDER_SITE_OTHER): Payer: Self-pay | Admitting: Internal Medicine

## 2018-04-25 ENCOUNTER — Encounter: Payer: Self-pay | Admitting: Internal Medicine

## 2018-04-25 VITALS — BP 170/98 | HR 99 | Temp 97.9°F | Resp 16 | Ht 60.0 in | Wt 160.0 lb

## 2018-04-25 DIAGNOSIS — I10 Essential (primary) hypertension: Secondary | ICD-10-CM

## 2018-04-25 DIAGNOSIS — E876 Hypokalemia: Secondary | ICD-10-CM

## 2018-04-25 DIAGNOSIS — G894 Chronic pain syndrome: Secondary | ICD-10-CM

## 2018-04-25 DIAGNOSIS — Z23 Encounter for immunization: Secondary | ICD-10-CM

## 2018-04-25 DIAGNOSIS — M4802 Spinal stenosis, cervical region: Secondary | ICD-10-CM

## 2018-04-25 MED ORDER — METHADONE HCL 10 MG PO TABS: ORAL_TABLET | ORAL | 0 refills | Status: DC

## 2018-04-25 MED ORDER — CLONIDINE HCL 0.1 MG/24HR TD PTWK: 0.1000 mg | MEDICATED_PATCH | TRANSDERMAL | 1 refills | Status: DC

## 2018-04-25 MED ORDER — POTASSIUM CHLORIDE CRYS ER 20 MEQ PO TBCR: 20.0000 meq | EXTENDED_RELEASE_TABLET | Freq: Three times a day (TID) | ORAL | 1 refills | Status: DC

## 2018-04-25 NOTE — Patient Instructions (Signed)

## 2018-04-25 NOTE — Progress Notes (Signed)
Subjective:  Patient ID: Emily [name], female    DOB: 08-06-1966  Age: 52 y.o. MRN: [id number]  CC: Hypertension   HPI Emily [name] presents for fu/p - She tells me she is taking all 3 of her antihypertensive.  There is one that she is cutting in half.  She is not sure which one it is.  She thinks when she takes it it makes her feel "sick."  She cannot describe her symptoms in any better detail.  She does complain of chronic neck pain and says she ran out of methadone one day prior to this visit.  She needs a refill.  She denies any recent episodes of headache, blurred vision, DOE, CP, shortness of breath, edema, or fatigue.  Outpatient Medications Prior to Visit  Medication Sig Dispense Refill  . atorvastatin (LIPITOR) 10 MG tablet Take 1 tablet (10 mg total) by mouth daily. 90 tablet 1  . chlorthalidone (HYGROTON) 25 MG tablet Take 1 tablet (25 mg total) by mouth daily. 90 tablet 1  . Cholecalciferol 2000 units TABS Take 1 tablet (2,000 Units total) by mouth daily. 90 tablet 1  . cyanocobalamin 2000 MCG tablet Take 1 tablet (2,000 mcg total) by mouth daily. 90 tablet 1  . Fe Cbn-Fe Gluc-FA-B12-C-DSS (FERRALET 90) 90-1 MG TABS Take 1 tablet by mouth daily. 90 each 1  . folic acid (FOLVITE) 1 MG tablet Take 1 tablet (1 mg total) by mouth daily. 90 tablet 3  . nebivolol (BYSTOLIC) 10 MG tablet Take 1 tablet (10 mg total) by mouth daily. 84 tablet 0  . olmesartan (BENICAR) 40 MG tablet Take 1 tablet (40 mg total) by mouth daily. 90 tablet 1  . omega-3 acid ethyl esters (LOVAZA) 1 g capsule Take 2 capsules (2 g total) by mouth 2 (two) times daily. 120 capsule 5  . sertraline (ZOLOFT) 100 MG tablet TAKE 1 TABLET IN THE MORNING AND 1/2 TAB IN THE EVENING. 45 tablet 2  . methadone (DOLOPHINE) 10 MG tablet Take a total of 30 mg [3 tablets] TID 270 tablet 0  . potassium chloride SA (K-DUR,KLOR-CON) 20 MEQ tablet Take 1 tablet (20 mEq total) by mouth 3 (three) times daily. 270 tablet 1    No facility-administered medications prior to visit.     ROS Review of Systems  Objective:  BP (!) 170/98 (BP Location: Left Arm, Patient Position: Sitting, Cuff Size: Normal)   Pulse 99   Temp 97.9 F (36.6 C) (Oral)   Resp 16   Ht 5' (1.524 m)   Wt 160 lb (72.6 kg)   SpO2 96%   BMI 31.25 kg/m   BP Readings from Last 3 Encounters:  04/25/18 (!) 170/98  01/24/18 (!) 190/110  10/31/17 140/90    Wt Readings from Last 3 Encounters:  04/25/18 160 lb (72.6 kg)  01/24/18 165 lb 4 oz (75 kg)  10/31/17 161 lb (73 kg)    Physical Exam  Lab Results  Component Value Date   WBC 10.3 01/24/2018   HGB 13.6 01/24/2018   HCT 40.7 01/24/2018   PLT 225.0 01/24/2018   GLUCOSE 105 (H) 01/24/2018   CHOL 152 10/31/2017   TRIG 355.0 (H) 01/24/2018   HDL 32.90 (L) 10/31/2017   LDLDIRECT 95.0 08/03/2017   LDLCALC 92 10/31/2017   ALT 7 01/24/2018   AST 18 01/24/2018   NA 138 01/24/2018   K 4.2 01/24/2018   CL 100 01/24/2018   CREATININE 1.09 01/24/2018   BUN 10 01/24/2018  CO2 27 01/24/2018   TSH 3.08 02/01/2017   INR 1.0 04/23/2008   HGBA1C 5.9 05/04/2017    No results found.  Assessment & Plan:   Everli was seen today for hypertension.  Diagnoses and all orders for this visit:  Need for influenza vaccination -     Flu Vaccine QUAD 36+ mos IM  Essential hypertension- Her blood pressure is not adequately well controlled.  I spoke to her pharmacy in [place]  and they tell me that she is not consistently picking up her antihypertensives.  I have encouraged the patient to be compliant with all 3 antihypertensive that are currently prescribed.  I have also asked her to add on the clonidine patch. -     cloNIDine (CATAPRES - DOSED IN MG/24 HR) 0.1 mg/24hr patch; Place 1 patch (0.1 mg total) onto the skin once a week. -     potassium chloride SA (K-DUR,KLOR-CON) 20 MEQ tablet; Take 1 tablet (20 mEq total) by mouth 3 (three) times daily.  Chronic pain syndrome -      methadone (DOLOPHINE) 10 MG tablet; Take a total of 30 mg [3 tablets] TID  Cervical spinal stenosis -     methadone (DOLOPHINE) 10 MG tablet; Take a total of 30 mg [3 tablets] TID  Hypokalemia -     potassium chloride SA (K-DUR,KLOR-CON) 20 MEQ tablet; Take 1 tablet (20 mEq total) by mouth 3 (three) times daily.   I am having Emily Sciara start on cloNIDine. I am also having her maintain her sertraline, folic acid, omega-3 acid ethyl esters, cyanocobalamin, Cholecalciferol, FERRALET 90, atorvastatin, olmesartan, nebivolol, chlorthalidone, methadone, and potassium chloride SA.  Meds ordered this encounter  Medications  . methadone (DOLOPHINE) 10 MG tablet    Sig: Take a total of 30 mg [3 tablets] TID    Dispense:  270 tablet    Refill:  0  . cloNIDine (CATAPRES - DOSED IN MG/24 HR) 0.1 mg/24hr patch    Sig: Place 1 patch (0.1 mg total) onto the skin once a week.    Dispense:  12 patch    Refill:  1  . potassium chloride SA (K-DUR,KLOR-CON) 20 MEQ tablet    Sig: Take 1 tablet (20 mEq total) by mouth 3 (three) times daily.    Dispense:  270 tablet    Refill:  1     Follow-up: Return in about 3 months (around 07/25/2018).  Sanda Linger, MD

## 2018-05-03 ENCOUNTER — Encounter: Payer: Self-pay | Admitting: Internal Medicine

## 2018-05-23 ENCOUNTER — Other Ambulatory Visit: Payer: Self-pay | Admitting: Internal Medicine

## 2018-05-23 DIAGNOSIS — G894 Chronic pain syndrome: Secondary | ICD-10-CM

## 2018-05-23 DIAGNOSIS — M4802 Spinal stenosis, cervical region: Secondary | ICD-10-CM

## 2018-05-23 MED ORDER — METHADONE HCL 10 MG PO TABS: ORAL_TABLET | ORAL | 0 refills | Status: DC

## 2018-06-21 ENCOUNTER — Other Ambulatory Visit: Payer: Self-pay | Admitting: Internal Medicine

## 2018-06-21 DIAGNOSIS — G894 Chronic pain syndrome: Secondary | ICD-10-CM

## 2018-06-21 DIAGNOSIS — M4802 Spinal stenosis, cervical region: Secondary | ICD-10-CM

## 2018-06-21 MED ORDER — METHADONE HCL 10 MG PO TABS: ORAL_TABLET | ORAL | 0 refills | Status: DC

## 2018-07-20 ENCOUNTER — Encounter: Payer: Self-pay | Admitting: Internal Medicine

## 2018-07-20 ENCOUNTER — Ambulatory Visit (INDEPENDENT_AMBULATORY_CARE_PROVIDER_SITE_OTHER): Payer: Self-pay | Admitting: Internal Medicine

## 2018-07-20 ENCOUNTER — Ambulatory Visit: Payer: No Typology Code available for payment source | Admitting: Internal Medicine

## 2018-07-20 ENCOUNTER — Other Ambulatory Visit (INDEPENDENT_AMBULATORY_CARE_PROVIDER_SITE_OTHER): Payer: Self-pay

## 2018-07-20 VITALS — BP 174/110 | HR 74 | Temp 98.8°F | Ht 60.0 in | Wt 167.0 lb

## 2018-07-20 DIAGNOSIS — N183 Chronic kidney disease, stage 3 unspecified: Secondary | ICD-10-CM

## 2018-07-20 DIAGNOSIS — K760 Fatty (change of) liver, not elsewhere classified: Secondary | ICD-10-CM

## 2018-07-20 DIAGNOSIS — E781 Pure hyperglyceridemia: Secondary | ICD-10-CM

## 2018-07-20 DIAGNOSIS — D520 Dietary folate deficiency anemia: Secondary | ICD-10-CM

## 2018-07-20 DIAGNOSIS — Z114 Encounter for screening for human immunodeficiency virus [HIV]: Secondary | ICD-10-CM

## 2018-07-20 DIAGNOSIS — M4802 Spinal stenosis, cervical region: Secondary | ICD-10-CM

## 2018-07-20 DIAGNOSIS — I10 Essential (primary) hypertension: Secondary | ICD-10-CM

## 2018-07-20 DIAGNOSIS — D508 Other iron deficiency anemias: Secondary | ICD-10-CM

## 2018-07-20 DIAGNOSIS — G894 Chronic pain syndrome: Secondary | ICD-10-CM

## 2018-07-20 DIAGNOSIS — R7989 Other specified abnormal findings of blood chemistry: Secondary | ICD-10-CM

## 2018-07-20 DIAGNOSIS — Z1239 Encounter for other screening for malignant neoplasm of breast: Secondary | ICD-10-CM

## 2018-07-20 LAB — COMPREHENSIVE METABOLIC PANEL
ALT: 6 U/L (ref 0–35)
AST: 16 U/L (ref 0–37)
Albumin: 4.4 g/dL (ref 3.5–5.2)
Alkaline Phosphatase: 86 U/L (ref 39–117)
BUN: 17 mg/dL (ref 6–23)
CALCIUM: 10.1 mg/dL (ref 8.4–10.5)
CO2: 31 mEq/L (ref 19–32)
Chloride: 97 mEq/L (ref 96–112)
Creatinine, Ser: 1.19 mg/dL (ref 0.40–1.20)
GFR: 50.31 mL/min — ABNORMAL LOW (ref >60.00)
Glucose, Bld: 89 mg/dL (ref 70–99)
Potassium: 3.7 mEq/L (ref 3.5–5.1)
Sodium: 138 mEq/L (ref 135–145)
TOTAL PROTEIN: 7.8 g/dL (ref 6.0–8.3)
Total Bilirubin: 0.3 mg/dL (ref 0.2–1.2)

## 2018-07-20 LAB — CBC WITH DIFFERENTIAL/PLATELET
BASOS ABS: 0.1 10*3/uL (ref 0.0–0.1)
Basophils Relative: 1.3 % (ref 0.0–3.0)
Eosinophils Absolute: 0.2 10*3/uL (ref 0.0–0.7)
Eosinophils Relative: 2.3 % (ref 0.0–5.0)
HCT: 41.4 % (ref 36.0–46.0)
Hemoglobin: 13.6 g/dL (ref 12.0–15.0)
Lymphocytes Relative: 30.2 % (ref 12.0–46.0)
Lymphs Abs: 2.6 10*3/uL (ref 0.7–4.0)
MCHC: 32.9 g/dL (ref 30.0–36.0)
MCV: 84.7 fl (ref 78.0–100.0)
Monocytes Absolute: 0.8 10*3/uL (ref 0.1–1.0)
Monocytes Relative: 8.9 % (ref 3.0–12.0)
Neutro Abs: 4.9 10*3/uL (ref 1.4–7.7)
Neutrophils Relative %: 57.3 % (ref 43.0–77.0)
Platelets: 242 10*3/uL (ref 150.0–400.0)
RBC: 4.89 Mil/uL (ref 3.87–5.11)
RDW: 16.3 % — ABNORMAL HIGH (ref 11.5–15.5)
WBC: 8.5 10*3/uL (ref 4.0–10.5)

## 2018-07-20 LAB — TRIGLYCERIDES: Triglycerides: 267 mg/dL — ABNORMAL HIGH (ref 0.0–149.0)

## 2018-07-20 LAB — TSH: TSH: 5.88 u[IU]/mL — AB (ref 0.35–4.50)

## 2018-07-20 MED ORDER — METHADONE HCL 10 MG PO TABS: ORAL_TABLET | ORAL | 0 refills | Status: DC

## 2018-07-20 NOTE — Progress Notes (Signed)
Subjective:  Patient ID: Emily [name], female    DOB: 08-Sep-1965  Age: 52 y.o. MRN: [id number]  CC: Hypertension   HPI Emily [name] presents for f/up - She continues to complain of chronic, nonradiating neck pain.  She is getting adequate symptom relief with the current dose of methadone and she requests a refill.  She is not ready to have surgery at this time.  She tells me her blood pressure is usually well controlled at home.  When she checks it she gets numbers of 120-130/80-90.  She does, however, tell me that her compliance with her antihypertensives is about 50%.  She thinks she gets nervous when she comes to see me and that spikes her blood pressure.  She denies any recent headache episodes of headache, blurred vision, chest pain, or shortness of breath.  Outpatient Medications Prior to Visit  Medication Sig Dispense Refill  . atorvastatin (LIPITOR) 10 MG tablet Take 1 tablet (10 mg total) by mouth daily. 90 tablet 1  . chlorthalidone (HYGROTON) 25 MG tablet Take 1 tablet (25 mg total) by mouth daily. 90 tablet 1  . Cholecalciferol 2000 units TABS Take 1 tablet (2,000 Units total) by mouth daily. 90 tablet 1  . cloNIDine (CATAPRES - DOSED IN MG/24 HR) 0.1 mg/24hr patch Place 1 patch (0.1 mg total) onto the skin once a week. 12 patch 1  . cyanocobalamin 2000 MCG tablet Take 1 tablet (2,000 mcg total) by mouth daily. 90 tablet 1  . Fe Cbn-Fe Gluc-FA-B12-C-DSS (FERRALET 90) 90-1 MG TABS Take 1 tablet by mouth daily. 90 each 1  . folic acid (FOLVITE) 1 MG tablet Take 1 tablet (1 mg total) by mouth daily. 90 tablet 3  . nebivolol (BYSTOLIC) 10 MG tablet Take 1 tablet (10 mg total) by mouth daily. 84 tablet 0  . olmesartan (BENICAR) 40 MG tablet Take 1 tablet (40 mg total) by mouth daily. 90 tablet 1  . potassium chloride SA (K-DUR,KLOR-CON) 20 MEQ tablet Take 1 tablet (20 mEq total) by mouth 3 (three) times daily. 270 tablet 1  . sertraline (ZOLOFT) 100 MG tablet TAKE 1 TABLET  IN THE MORNING AND 1/2 TAB IN THE EVENING. 45 tablet 2  . methadone (DOLOPHINE) 10 MG tablet Take a total of 30 mg [3 tablets] TID 270 tablet 0  . omega-3 acid ethyl esters (LOVAZA) 1 g capsule Take 2 capsules (2 g total) by mouth 2 (two) times daily. 120 capsule 5   No facility-administered medications prior to visit.     ROS Review of Systems  Constitutional: Negative for diaphoresis and fatigue.  HENT: Negative.  Negative for trouble swallowing.   Eyes: Negative for visual disturbance.  Respiratory: Negative for cough, chest tightness, shortness of breath and wheezing.   Cardiovascular: Negative for chest pain, palpitations and leg swelling.  Gastrointestinal: Negative for abdominal pain, constipation, diarrhea, nausea and vomiting.  Genitourinary: Negative.  Negative for difficulty urinating, dysuria and hematuria.  Musculoskeletal: Positive for neck pain. Negative for arthralgias, back pain and myalgias.  Skin: Negative.  Negative for color change.  Neurological: Negative.  Negative for dizziness, weakness, light-headedness and numbness.  Hematological: Negative for adenopathy. Does not bruise/bleed easily.  Psychiatric/Behavioral: Negative.  Negative for dysphoric mood and sleep disturbance. The patient is not nervous/anxious.     Objective:  BP (!) 174/110 (BP Location: Left Arm, Patient Position: Sitting, Cuff Size: Normal)   Pulse 74   Temp 98.8 F (37.1 C) (Oral)   Ht 5' (1.524  m)   Wt 167 lb (75.8 kg)   SpO2 99%   BMI 32.61 kg/m   BP Readings from Last 3 Encounters:  07/20/18 (!) 174/110  04/25/18 (!) 170/98  01/24/18 (!) 190/110    Wt Readings from Last 3 Encounters:  07/20/18 167 lb (75.8 kg)  04/25/18 160 lb (72.6 kg)  01/24/18 165 lb 4 oz (75 kg)    Physical Exam Vitals signs reviewed.  Constitutional:      General: She is not in acute distress.    Appearance: Normal appearance. She is not ill-appearing, toxic-appearing or diaphoretic.  HENT:      Nose: Nose normal.     Mouth/Throat:     Mouth: Mucous membranes are moist.     Pharynx: No posterior oropharyngeal erythema.  Neck:     Musculoskeletal: Normal range of motion and neck supple.     Thyroid: No thyroid mass or thyromegaly.  Cardiovascular:     Rate and Rhythm: Normal rate and regular rhythm.     Pulses: Normal pulses.     Heart sounds: No murmur. No gallop.   Pulmonary:     Effort: Pulmonary effort is normal.     Breath sounds: Normal breath sounds. No wheezing, rhonchi or rales.  Abdominal:     General: Abdomen is flat.     Palpations: There is no hepatomegaly or splenomegaly.     Tenderness: There is no abdominal tenderness.     Hernia: No hernia is present.  Musculoskeletal: Normal range of motion.        General: No swelling or tenderness.     Cervical back: Normal. She exhibits normal range of motion, no tenderness, no bony tenderness, no swelling and no edema.  Skin:    General: Skin is warm and dry.  Neurological:     General: No focal deficit present.     Mental Status: She is alert and oriented to person, place, and time. Mental status is at baseline.     Motor: No weakness.     Coordination: Coordination normal.     Gait: Gait normal.     Deep Tendon Reflexes: Reflexes normal.  Psychiatric:        Mood and Affect: Mood normal.        Behavior: Behavior normal.     Lab Results  Component Value Date   WBC 8.5 07/20/2018   HGB 13.6 07/20/2018   HCT 41.4 07/20/2018   PLT 242.0 07/20/2018   GLUCOSE 89 07/20/2018   CHOL 152 10/31/2017   TRIG 267.0 (H) 07/20/2018   HDL 32.90 (L) 10/31/2017   LDLDIRECT 95.0 08/03/2017   LDLCALC 92 10/31/2017   ALT 6 07/20/2018   AST 16 07/20/2018   NA 138 07/20/2018   K 3.7 07/20/2018   CL 97 07/20/2018   CREATININE 1.19 07/20/2018   BUN 17 07/20/2018   CO2 31 07/20/2018   TSH 5.88 (H) 07/20/2018   INR 1.0 04/23/2008   HGBA1C 5.9 05/04/2017    No results found.  Assessment & Plan:   Luella was seen  today for hypertension.  Diagnoses and all orders for this visit:  Breast cancer screening -     MM Digital Screening; Future  Chronic pain syndrome -     methadone (DOLOPHINE) 10 MG tablet; Take a total of 30 mg [3 tablets] TID  Cervical spinal stenosis -     methadone (DOLOPHINE) 10 MG tablet; Take a total of 30 mg [3 tablets] TID  Iron deficiency  anemia secondary to inadequate dietary iron intake- Her H&H are normal now.  Will continue the current iron supplement. -     CBC with Differential/Platelet; Future  Chronic renal disease, stage 3, moderately decreased glomerular filtration rate (GFR) between 30-59 mL/min/1.73 square meter (HCC)- Her creatinine clearance is stable at 49.7.  I will try to maintain better blood pressure control. -     Comprehensive metabolic panel; Future  Essential hypertension-her blood pressure is not adequately well controlled.  This is partly due to noncompliance.  I have asked her to strive for 100% compliance with her antihypertensives.  It also sounds like there is a component of whitecoat phenomenon.  She agrees to continue monitoring her blood pressure. -     TSH; Future -     Comprehensive metabolic panel; Future  Fatty liver- Her LFTs are normal now. -     Comprehensive metabolic panel; Future  Dietary folate deficiency anemia- Her H&H are normal now. -     CBC with Differential/Platelet; Future  Hypertriglyceridemia- Her triglycerides are somewhat better at 267.  She was encouraged to continue taking the omega-3 fish oils and to reduce her intake of fat and carbohydrates. -     Triglycerides; Future  Encounter for screening for human immunodeficiency virus (HIV) -     HIV Antibody (routine testing w rflx); Future   I am having Emily Sciara maintain her sertraline, folic acid, cyanocobalamin, Cholecalciferol, FERRALET 90, atorvastatin, olmesartan, nebivolol, chlorthalidone, cloNIDine, potassium chloride SA, methadone, and omega-3 acid  ethyl esters.  Meds ordered this encounter  Medications  . methadone (DOLOPHINE) 10 MG tablet    Sig: Take a total of 30 mg [3 tablets] TID    Dispense:  270 tablet    Refill:  0  . omega-3 acid ethyl esters (LOVAZA) 1 g capsule    Sig: Take 2 capsules (2 g total) by mouth 2 (two) times daily.    Dispense:  360 capsule    Refill:  1     Follow-up: Return in about 3 months (around 10/19/2018).  Sanda Linger, MD

## 2018-07-20 NOTE — Patient Instructions (Signed)

## 2018-07-21 LAB — HIV ANTIBODY (ROUTINE TESTING W REFLEX): HIV 1&2 Ab, 4th Generation: NONREACTIVE

## 2018-07-22 ENCOUNTER — Encounter: Payer: Self-pay | Admitting: Internal Medicine

## 2018-07-23 DIAGNOSIS — R7989 Other specified abnormal findings of blood chemistry: Secondary | ICD-10-CM | POA: Insufficient documentation

## 2018-07-23 MED ORDER — OMEGA-3-ACID ETHYL ESTERS 1 G PO CAPS: 2.0000 g | ORAL_CAPSULE | Freq: Two times a day (BID) | ORAL | 1 refills | Status: DC

## 2018-07-24 ENCOUNTER — Ambulatory Visit: Payer: No Typology Code available for payment source | Admitting: Internal Medicine

## 2018-08-16 ENCOUNTER — Other Ambulatory Visit: Payer: Self-pay | Admitting: Internal Medicine

## 2018-08-16 DIAGNOSIS — M4802 Spinal stenosis, cervical region: Secondary | ICD-10-CM

## 2018-08-16 DIAGNOSIS — G894 Chronic pain syndrome: Secondary | ICD-10-CM

## 2018-08-17 ENCOUNTER — Encounter: Payer: Self-pay | Admitting: Internal Medicine

## 2018-08-18 ENCOUNTER — Encounter: Payer: Self-pay | Admitting: Internal Medicine

## 2018-08-18 MED ORDER — METHADONE HCL 10 MG PO TABS: ORAL_TABLET | ORAL | 0 refills | Status: DC

## 2018-09-14 ENCOUNTER — Other Ambulatory Visit: Payer: Self-pay | Admitting: Internal Medicine

## 2018-09-14 DIAGNOSIS — G894 Chronic pain syndrome: Secondary | ICD-10-CM

## 2018-09-14 DIAGNOSIS — M4802 Spinal stenosis, cervical region: Secondary | ICD-10-CM

## 2018-09-14 MED ORDER — METHADONE HCL 10 MG PO TABS: ORAL_TABLET | ORAL | 0 refills | Status: DC

## 2018-10-03 ENCOUNTER — Encounter: Payer: Self-pay | Admitting: Internal Medicine

## 2018-10-12 ENCOUNTER — Encounter: Payer: Self-pay | Admitting: Internal Medicine

## 2018-10-16 ENCOUNTER — Other Ambulatory Visit: Payer: Self-pay | Admitting: Internal Medicine

## 2018-10-16 ENCOUNTER — Encounter: Payer: Self-pay | Admitting: Internal Medicine

## 2018-10-16 DIAGNOSIS — M4802 Spinal stenosis, cervical region: Secondary | ICD-10-CM

## 2018-10-16 DIAGNOSIS — G894 Chronic pain syndrome: Secondary | ICD-10-CM

## 2018-10-17 ENCOUNTER — Encounter: Payer: Self-pay | Admitting: Internal Medicine

## 2018-10-17 ENCOUNTER — Other Ambulatory Visit: Payer: Self-pay | Admitting: Internal Medicine

## 2018-10-17 DIAGNOSIS — G894 Chronic pain syndrome: Secondary | ICD-10-CM

## 2018-10-17 DIAGNOSIS — M4802 Spinal stenosis, cervical region: Secondary | ICD-10-CM

## 2018-10-17 MED ORDER — METHADONE HCL 10 MG PO TABS: ORAL_TABLET | ORAL | 0 refills | Status: DC

## 2018-10-23 ENCOUNTER — Ambulatory Visit: Payer: Self-pay | Admitting: Internal Medicine

## 2018-11-06 ENCOUNTER — Ambulatory Visit: Payer: No Typology Code available for payment source | Admitting: Internal Medicine

## 2018-11-08 ENCOUNTER — Encounter

## 2018-11-08 ENCOUNTER — Ambulatory Visit: Payer: Self-pay | Admitting: Internal Medicine

## 2018-11-14 ENCOUNTER — Other Ambulatory Visit: Payer: Self-pay | Admitting: Internal Medicine

## 2018-11-14 ENCOUNTER — Encounter: Payer: Self-pay | Admitting: Internal Medicine

## 2018-11-14 DIAGNOSIS — M4802 Spinal stenosis, cervical region: Secondary | ICD-10-CM

## 2018-11-14 DIAGNOSIS — G894 Chronic pain syndrome: Secondary | ICD-10-CM

## 2018-11-14 MED ORDER — METHADONE HCL 10 MG PO TABS: ORAL_TABLET | ORAL | 0 refills | Status: DC

## 2018-11-20 ENCOUNTER — Ambulatory Visit: Payer: Self-pay | Admitting: Internal Medicine

## 2018-11-20 ENCOUNTER — Encounter: Payer: Self-pay | Admitting: Internal Medicine

## 2018-11-21 ENCOUNTER — Other Ambulatory Visit: Payer: Self-pay

## 2018-11-21 ENCOUNTER — Other Ambulatory Visit (INDEPENDENT_AMBULATORY_CARE_PROVIDER_SITE_OTHER): Payer: No Typology Code available for payment source

## 2018-11-21 ENCOUNTER — Ambulatory Visit (INDEPENDENT_AMBULATORY_CARE_PROVIDER_SITE_OTHER): Payer: Self-pay | Admitting: Internal Medicine

## 2018-11-21 ENCOUNTER — Encounter: Payer: Self-pay | Admitting: Internal Medicine

## 2018-11-21 VITALS — BP 218/108 | HR 81 | Temp 97.9°F | Ht 60.0 in | Wt 162.0 lb

## 2018-11-21 DIAGNOSIS — N183 Chronic kidney disease, stage 3 unspecified: Secondary | ICD-10-CM

## 2018-11-21 DIAGNOSIS — T50905A Adverse effect of unspecified drugs, medicaments and biological substances, initial encounter: Secondary | ICD-10-CM

## 2018-11-21 DIAGNOSIS — Z79891 Long term (current) use of opiate analgesic: Secondary | ICD-10-CM

## 2018-11-21 DIAGNOSIS — E876 Hypokalemia: Secondary | ICD-10-CM

## 2018-11-21 DIAGNOSIS — R7989 Other specified abnormal findings of blood chemistry: Secondary | ICD-10-CM

## 2018-11-21 DIAGNOSIS — I10 Essential (primary) hypertension: Secondary | ICD-10-CM

## 2018-11-21 LAB — URINALYSIS, ROUTINE W REFLEX MICROSCOPIC
Bilirubin Urine: NEGATIVE
Ketones, ur: NEGATIVE
Leukocytes,Ua: NEGATIVE
Nitrite: NEGATIVE
Specific Gravity, Urine: 1.01 (ref 1.000–1.030)
Total Protein, Urine: NEGATIVE
Urine Glucose: NEGATIVE
Urobilinogen, UA: 0.2 (ref 0.0–1.0)
pH: 6 (ref 5.0–8.0)

## 2018-11-21 LAB — BASIC METABOLIC PANEL
BUN: 15 mg/dL (ref 6–23)
CO2: 28 mEq/L (ref 19–32)
Calcium: 10 mg/dL (ref 8.4–10.5)
Chloride: 99 mEq/L (ref 96–112)
Creatinine, Ser: 1.2 mg/dL (ref 0.40–1.20)
GFR: 46.82 mL/min — ABNORMAL LOW (ref >60.00)
Glucose, Bld: 97 mg/dL (ref 70–99)
Potassium: 3.4 mEq/L — ABNORMAL LOW (ref 3.5–5.1)
Sodium: 139 mEq/L (ref 135–145)

## 2018-11-21 NOTE — Patient Instructions (Signed)

## 2018-11-21 NOTE — Progress Notes (Signed)
Subjective:  Patient ID: Emily [name], female    DOB: 12/09/1965  Age: 53 y.o. MRN: [id number]  CC: Hypertension   HPI Emily [name] presents for f/up - She tells me her compliance with her antihypertensives is about 50%.  She complains of fatigue but denies headache, blurred vision, CP, DOE, edema, or paresthesias.  She continues to complain of neck pain but tells me it is well controlled with the current dose of methadone.  Outpatient Medications Prior to Visit  Medication Sig Dispense Refill   atorvastatin (LIPITOR) 10 MG tablet Take 1 tablet (10 mg total) by mouth daily. 90 tablet 1   chlorthalidone (HYGROTON) 25 MG tablet Take 1 tablet (25 mg total) by mouth daily. 90 tablet 1   Cholecalciferol 2000 units TABS Take 1 tablet (2,000 Units total) by mouth daily. 90 tablet 1   cloNIDine (CATAPRES - DOSED IN MG/24 HR) 0.1 mg/24hr patch Place 1 patch (0.1 mg total) onto the skin once a week. 12 patch 1   cyanocobalamin 2000 MCG tablet Take 1 tablet (2,000 mcg total) by mouth daily. 90 tablet 1   Fe Cbn-Fe Gluc-FA-B12-C-DSS (FERRALET 90) 90-1 MG TABS Take 1 tablet by mouth daily. 90 each 1   folic acid (FOLVITE) 1 MG tablet Take 1 tablet (1 mg total) by mouth daily. 90 tablet 3   methadone (DOLOPHINE) 10 MG tablet Take a total of 30 mg [3 tablets] TID 270 tablet 0   nebivolol (BYSTOLIC) 10 MG tablet Take 1 tablet (10 mg total) by mouth daily. 84 tablet 0   olmesartan (BENICAR) 40 MG tablet Take 1 tablet (40 mg total) by mouth daily. 90 tablet 1   omega-3 acid ethyl esters (LOVAZA) 1 g capsule Take 2 capsules (2 g total) by mouth 2 (two) times daily. 360 capsule 1   sertraline (ZOLOFT) 100 MG tablet TAKE 1 TABLET IN THE MORNING AND 1/2 TAB IN THE EVENING. 45 tablet 2   potassium chloride SA (K-DUR,KLOR-CON) 20 MEQ tablet Take 1 tablet (20 mEq total) by mouth 3 (three) times daily. 270 tablet 1   No facility-administered medications prior to visit.     ROS Review of  Systems  Constitutional: Positive for fatigue. Negative for diaphoresis and unexpected weight change.  HENT: Negative.   Eyes: Negative for visual disturbance.  Respiratory: Negative for cough, chest tightness, shortness of breath and wheezing.   Cardiovascular: Negative for chest pain, palpitations and leg swelling.  Gastrointestinal: Negative for abdominal pain, constipation, diarrhea, nausea and vomiting.  Endocrine: Negative.  Negative for cold intolerance and heat intolerance.  Genitourinary: Negative.  Negative for difficulty urinating and dysuria.  Musculoskeletal: Positive for neck pain. Negative for arthralgias and back pain.  Skin: Negative.  Negative for color change, pallor and rash.  Neurological: Negative.  Negative for dizziness, weakness, light-headedness, numbness and headaches.  Hematological: Negative for adenopathy. Does not bruise/bleed easily.  Psychiatric/Behavioral: Negative.  Negative for dysphoric mood, sleep disturbance and suicidal ideas. The patient is not nervous/anxious.     Objective:  BP (!) 218/108 (BP Location: Left Arm, Patient Position: Sitting, Cuff Size: Normal)    Pulse 81    Temp 97.9 F (36.6 C) (Oral)    Ht 5' (1.524 m)    Wt 162 lb (73.5 kg)    SpO2 99%    BMI 31.64 kg/m   BP Readings from Last 3 Encounters:  11/21/18 (!) 218/108  07/20/18 (!) 174/110  04/25/18 (!) 170/98    Wt Readings from  Last 3 Encounters:  11/21/18 162 lb (73.5 kg)  07/20/18 167 lb (75.8 kg)  04/25/18 160 lb (72.6 kg)    Physical Exam Vitals signs reviewed.  Constitutional:      Appearance: She is not ill-appearing or diaphoretic.  HENT:     Nose: Nose normal.     Mouth/Throat:     Mouth: Mucous membranes are moist.  Eyes:     Conjunctiva/sclera: Conjunctivae normal.  Neck:     Musculoskeletal: Normal range of motion and neck supple. No muscular tenderness.  Cardiovascular:     Rate and Rhythm: Normal rate and regular rhythm.     Pulses: Normal pulses.      Heart sounds: No murmur. No gallop.   Pulmonary:     Effort: Pulmonary effort is normal. No respiratory distress.     Breath sounds: No stridor. No wheezing, rhonchi or rales.  Abdominal:     General: Abdomen is flat.     Palpations: There is no mass.     Tenderness: There is no abdominal tenderness. There is no guarding.  Lymphadenopathy:     Cervical: No cervical adenopathy.  Skin:    Findings: Lesion present.     Comments: On the dorsum of both forearms there are a few excoriated lesions and areas of scarring.  The lesions all measure about 0.5 centimeter and do not show any evidence of secondary infection.  There is no erythema, exudate, warmth, induration, or fluctuance.  Neurological:     General: No focal deficit present.     Mental Status: She is oriented to person, place, and time. Mental status is at baseline.  Psychiatric:        Mood and Affect: Mood normal.        Behavior: Behavior normal.        Thought Content: Thought content normal.        Judgment: Judgment normal.     Lab Results  Component Value Date   WBC 8.5 07/20/2018   HGB 13.6 07/20/2018   HCT 41.4 07/20/2018   PLT 242.0 07/20/2018   GLUCOSE 97 11/21/2018   CHOL 152 10/31/2017   TRIG 267.0 (H) 07/20/2018   HDL 32.90 (L) 10/31/2017   LDLDIRECT 95.0 08/03/2017   LDLCALC 92 10/31/2017   ALT 6 07/20/2018   AST 16 07/20/2018   NA 139 11/21/2018   K 3.4 (L) 11/21/2018   CL 99 11/21/2018   CREATININE 1.20 11/21/2018   BUN 15 11/21/2018   CO2 28 11/21/2018   TSH 3.94 11/21/2018   INR 1.0 04/23/2008   HGBA1C 5.9 05/04/2017    No results found.  Assessment & Plan:   Emily [name] was seen today for hypertension.  Diagnoses and all orders for this visit:  Essential hypertension- Her blood pressure is not adequately well controlled.  Her labs are negative for secondary causes or endorgan damage.  I am concerned about amphetamine abuse so I ordered a urine drug screen for amphetamines but I am  subsequently told by lab core that our lab did not send an adequate specimen.  I have notified our lab to call her and to collect another urine specimen.  I am also concerned about her lack of compliance with her antihypertensives so have asked her to try to increase her compliance from 50% of the time to 100% of the time.  For now we will continue the current antihypertensives. -     Basic metabolic panel; Future -     Amphetamine Screen,  Urine; Future -     Urinalysis, Routine w reflex microscopic; Future  Chronic renal disease, stage 3, moderately decreased glomerular filtration rate (GFR) between 30-59 mL/min/1.73 square meter (HCC)-renal function is stable.  I have implored her to get better control of her blood pressure. -     Basic metabolic panel; Future -     Amphetamine Screen, Urine; Future -     Urinalysis, Routine w reflex microscopic; Future  TSH elevation- Her TSH and TFTs are normal now.  TPO antibodies are negative.  Her only symptom is fatigue so I do not think she has hypothyroidism. -     Thyroid Panel With TSH; Future -     Thyroid peroxidase antibody; Future  Long-term current use of opiate analgesic -     Pain Mgmt, Methadone Conf w/mM, U; Future -     Pain Mgmt, Profile 8 w/Conf, U; Future   I am having Emily [name] maintain her sertraline, folic acid, cyanocobalamin, Cholecalciferol, Ferralet 90, atorvastatin, olmesartan, nebivolol, chlorthalidone, cloNIDine, omega-3 acid ethyl esters, methadone, and potassium chloride SA.  Meds ordered this encounter  Medications   potassium chloride SA (K-DUR,KLOR-CON) 20 MEQ tablet    Sig: Take 1 tablet (20 mEq total) by mouth 3 (three) times daily.    Dispense:  270 tablet    Refill:  1     Follow-up: Return in about 4 months (around 03/23/2019).  Emily Calico, MD

## 2018-11-22 LAB — THYROID PEROXIDASE ANTIBODY: Thyroperoxidase Ab SerPl-aCnc: 1 IU/mL (ref <9)

## 2018-11-22 LAB — THYROID PANEL WITH TSH
Free Thyroxine Index: 2 (ref 1.4–3.8)
T3 Uptake: 26 % (ref 22–35)
T4, Total: 7.7 ug/dL (ref 5.1–11.9)
TSH: 3.94 mIU/L

## 2018-11-23 ENCOUNTER — Encounter: Payer: Self-pay | Admitting: Internal Medicine

## 2018-11-23 DIAGNOSIS — T50905A Adverse effect of unspecified drugs, medicaments and biological substances, initial encounter: Secondary | ICD-10-CM | POA: Insufficient documentation

## 2018-11-23 DIAGNOSIS — E876 Hypokalemia: Secondary | ICD-10-CM | POA: Insufficient documentation

## 2018-11-23 LAB — PAIN MGMT, PROFILE 8 W/CONF, U
6 Acetylmorphine: NEGATIVE ng/mL (ref <10)
Alcohol Metabolites: NEGATIVE ng/mL (ref <500)
Amphetamines: NEGATIVE ng/mL (ref <500)
Benzodiazepines: NEGATIVE ng/mL (ref <100)
Buprenorphine, Urine: NEGATIVE ng/mL (ref <5)
Cocaine Metabolite: NEGATIVE ng/mL (ref <150)
Creatinine: 37.1 mg/dL
MDMA: NEGATIVE ng/mL (ref <500)
Marijuana Metabolite: NEGATIVE ng/mL (ref <20)
Opiates: NEGATIVE ng/mL (ref <100)
Oxidant: NEGATIVE ug/mL (ref <200)
Oxycodone: NEGATIVE ng/mL (ref <100)
pH: 6.09 (ref 4.5–9.0)

## 2018-11-23 LAB — PAIN MGMT, METHADONE CONF W/MM, U
EDDP: 8663 ng/mL — ABNORMAL HIGH (ref <100)
Methadone Metabolite: POSITIVE ng/mL — AB (ref <100)
Methadone: 8740 ng/mL — ABNORMAL HIGH (ref <100)

## 2018-11-23 LAB — SPECIMEN STATUS REPORT

## 2018-11-23 LAB — AMPHETAMINE SCREEN, URINE

## 2018-11-23 MED ORDER — POTASSIUM CHLORIDE CRYS ER 20 MEQ PO TBCR: 20.0000 meq | EXTENDED_RELEASE_TABLET | Freq: Three times a day (TID) | ORAL | 1 refills | Status: DC

## 2018-12-11 ENCOUNTER — Other Ambulatory Visit: Payer: Self-pay | Admitting: Internal Medicine

## 2018-12-11 DIAGNOSIS — G894 Chronic pain syndrome: Secondary | ICD-10-CM

## 2018-12-11 DIAGNOSIS — M4802 Spinal stenosis, cervical region: Secondary | ICD-10-CM

## 2018-12-11 NOTE — Telephone Encounter (Signed)
Per database, methadone last filled on 11/14/2018.

## 2018-12-12 MED ORDER — METHADONE HCL 10 MG PO TABS: ORAL_TABLET | ORAL | 0 refills | Status: DC

## 2019-01-10 ENCOUNTER — Other Ambulatory Visit: Payer: Self-pay | Admitting: Internal Medicine

## 2019-01-10 DIAGNOSIS — M4802 Spinal stenosis, cervical region: Secondary | ICD-10-CM

## 2019-01-10 DIAGNOSIS — G894 Chronic pain syndrome: Secondary | ICD-10-CM

## 2019-01-11 ENCOUNTER — Encounter: Payer: Self-pay | Admitting: Internal Medicine

## 2019-01-11 ENCOUNTER — Other Ambulatory Visit: Payer: Self-pay | Admitting: Internal Medicine

## 2019-01-11 DIAGNOSIS — M4802 Spinal stenosis, cervical region: Secondary | ICD-10-CM

## 2019-01-11 DIAGNOSIS — G894 Chronic pain syndrome: Secondary | ICD-10-CM

## 2019-01-11 MED ORDER — METHADONE HCL 10 MG PO TABS: ORAL_TABLET | ORAL | 0 refills | Status: DC

## 2019-02-05 ENCOUNTER — Other Ambulatory Visit: Payer: Self-pay | Admitting: Internal Medicine

## 2019-02-05 DIAGNOSIS — I10 Essential (primary) hypertension: Secondary | ICD-10-CM

## 2019-02-06 ENCOUNTER — Other Ambulatory Visit: Payer: Self-pay | Admitting: Internal Medicine

## 2019-02-06 DIAGNOSIS — G894 Chronic pain syndrome: Secondary | ICD-10-CM

## 2019-02-06 DIAGNOSIS — M4802 Spinal stenosis, cervical region: Secondary | ICD-10-CM

## 2019-02-08 ENCOUNTER — Encounter: Payer: Self-pay | Admitting: Internal Medicine

## 2019-02-08 ENCOUNTER — Other Ambulatory Visit: Payer: Self-pay | Admitting: Internal Medicine

## 2019-02-08 DIAGNOSIS — M4802 Spinal stenosis, cervical region: Secondary | ICD-10-CM

## 2019-02-08 DIAGNOSIS — G894 Chronic pain syndrome: Secondary | ICD-10-CM

## 2019-02-08 MED ORDER — METHADONE HCL 10 MG PO TABS: ORAL_TABLET | ORAL | 0 refills | Status: DC

## 2019-03-07 ENCOUNTER — Other Ambulatory Visit: Payer: Self-pay | Admitting: Internal Medicine

## 2019-03-07 DIAGNOSIS — M4802 Spinal stenosis, cervical region: Secondary | ICD-10-CM

## 2019-03-07 DIAGNOSIS — G894 Chronic pain syndrome: Secondary | ICD-10-CM

## 2019-03-08 ENCOUNTER — Encounter: Payer: Self-pay | Admitting: Internal Medicine

## 2019-03-08 MED ORDER — METHADONE HCL 10 MG PO TABS: ORAL_TABLET | ORAL | 0 refills | Status: DC

## 2019-03-09 ENCOUNTER — Other Ambulatory Visit: Payer: Self-pay | Admitting: Internal Medicine

## 2019-03-12 ENCOUNTER — Ambulatory Visit: Payer: Self-pay | Admitting: Internal Medicine

## 2019-03-21 ENCOUNTER — Other Ambulatory Visit (INDEPENDENT_AMBULATORY_CARE_PROVIDER_SITE_OTHER): Payer: Self-pay

## 2019-03-21 ENCOUNTER — Other Ambulatory Visit: Payer: Self-pay

## 2019-03-21 ENCOUNTER — Encounter: Payer: Self-pay | Admitting: Internal Medicine

## 2019-03-21 ENCOUNTER — Ambulatory Visit (INDEPENDENT_AMBULATORY_CARE_PROVIDER_SITE_OTHER): Payer: Self-pay | Admitting: Internal Medicine

## 2019-03-21 VITALS — BP 160/110 | HR 92 | Temp 98.0°F | Resp 16 | Ht 60.0 in | Wt 164.0 lb

## 2019-03-21 DIAGNOSIS — D520 Dietary folate deficiency anemia: Secondary | ICD-10-CM

## 2019-03-21 DIAGNOSIS — K760 Fatty (change of) liver, not elsewhere classified: Secondary | ICD-10-CM

## 2019-03-21 DIAGNOSIS — T50905A Adverse effect of unspecified drugs, medicaments and biological substances, initial encounter: Secondary | ICD-10-CM

## 2019-03-21 DIAGNOSIS — N183 Chronic kidney disease, stage 3 unspecified: Secondary | ICD-10-CM

## 2019-03-21 DIAGNOSIS — D508 Other iron deficiency anemias: Secondary | ICD-10-CM

## 2019-03-21 DIAGNOSIS — E876 Hypokalemia: Secondary | ICD-10-CM

## 2019-03-21 DIAGNOSIS — E781 Pure hyperglyceridemia: Secondary | ICD-10-CM

## 2019-03-21 DIAGNOSIS — I10 Essential (primary) hypertension: Secondary | ICD-10-CM

## 2019-03-21 DIAGNOSIS — E785 Hyperlipidemia, unspecified: Secondary | ICD-10-CM

## 2019-03-21 LAB — VITAMIN B12: Vitamin B-12: 290 pg/mL (ref 211–911)

## 2019-03-21 LAB — BASIC METABOLIC PANEL
BUN: 12 mg/dL (ref 6–23)
CO2: 28 mEq/L (ref 19–32)
Calcium: 9.8 mg/dL (ref 8.4–10.5)
Chloride: 100 mEq/L (ref 96–112)
Creatinine, Ser: 1.1 mg/dL (ref 0.40–1.20)
GFR: 51.7 mL/min — ABNORMAL LOW (ref >60.00)
Glucose, Bld: 99 mg/dL (ref 70–99)
Potassium: 3.8 mEq/L (ref 3.5–5.1)
Sodium: 138 mEq/L (ref 135–145)

## 2019-03-21 LAB — CBC WITH DIFFERENTIAL/PLATELET
Basophils Absolute: 0.1 10*3/uL (ref 0.0–0.1)
Basophils Relative: 1.2 % (ref 0.0–3.0)
Eosinophils Absolute: 0.2 10*3/uL (ref 0.0–0.7)
Eosinophils Relative: 2 % (ref 0.0–5.0)
HCT: 41.9 % (ref 36.0–46.0)
Hemoglobin: 13.8 g/dL (ref 12.0–15.0)
Lymphocytes Relative: 28.2 % (ref 12.0–46.0)
Lymphs Abs: 3 10*3/uL (ref 0.7–4.0)
MCHC: 32.9 g/dL (ref 30.0–36.0)
MCV: 84.8 fl (ref 78.0–100.0)
Monocytes Absolute: 0.7 10*3/uL (ref 0.1–1.0)
Monocytes Relative: 7 % (ref 3.0–12.0)
Neutro Abs: 6.5 10*3/uL (ref 1.4–7.7)
Neutrophils Relative %: 61.6 % (ref 43.0–77.0)
Platelets: 295 10*3/uL (ref 150.0–400.0)
RBC: 4.94 Mil/uL (ref 3.87–5.11)
RDW: 15.1 % (ref 11.5–15.5)
WBC: 10.6 10*3/uL — ABNORMAL HIGH (ref 4.0–10.5)

## 2019-03-21 LAB — LIPID PANEL
Cholesterol: 182 mg/dL (ref 0–200)
HDL: 29.3 mg/dL — ABNORMAL LOW (ref >39.00)
NonHDL: 152.56
Total CHOL/HDL Ratio: 6
Triglycerides: 279 mg/dL — ABNORMAL HIGH (ref 0.0–149.0)
VLDL: 55.8 mg/dL — ABNORMAL HIGH (ref 0.0–40.0)

## 2019-03-21 LAB — IBC PANEL
Iron: 51 ug/dL (ref 42–145)
Saturation Ratios: 12.4 % — ABNORMAL LOW (ref 20.0–50.0)
Transferrin: 293 mg/dL (ref 212.0–360.0)

## 2019-03-21 LAB — FOLATE: Folate: 6.3 ng/mL (ref >5.9)

## 2019-03-21 LAB — HEPATIC FUNCTION PANEL
ALT: 7 U/L (ref 0–35)
AST: 18 U/L (ref 0–37)
Albumin: 4.3 g/dL (ref 3.5–5.2)
Alkaline Phosphatase: 98 U/L (ref 39–117)
Bilirubin, Direct: 0.1 mg/dL (ref 0.0–0.3)
Total Bilirubin: 0.3 mg/dL (ref 0.2–1.2)
Total Protein: 7.8 g/dL (ref 6.0–8.3)

## 2019-03-21 LAB — LDL CHOLESTEROL, DIRECT: Direct LDL: 110 mg/dL

## 2019-03-21 LAB — FERRITIN: Ferritin: 37.7 ng/mL (ref 10.0–291.0)

## 2019-03-21 MED ORDER — ATORVASTATIN CALCIUM 10 MG PO TABS: 10.0000 mg | ORAL_TABLET | Freq: Every day | ORAL | 1 refills | Status: DC

## 2019-03-21 MED ORDER — OMEGA-3-ACID ETHYL ESTERS 1 G PO CAPS: 2.0000 g | ORAL_CAPSULE | Freq: Two times a day (BID) | ORAL | 1 refills | Status: DC

## 2019-03-21 MED ORDER — NEBIVOLOL HCL 10 MG PO TABS: 10.0000 mg | ORAL_TABLET | Freq: Every day | ORAL | 0 refills | Status: DC

## 2019-03-21 MED ORDER — OLMESARTAN MEDOXOMIL 40 MG PO TABS: 40.0000 mg | ORAL_TABLET | Freq: Every day | ORAL | 1 refills | Status: DC

## 2019-03-21 MED ORDER — CHLORTHALIDONE 25 MG PO TABS: 25.0000 mg | ORAL_TABLET | Freq: Every day | ORAL | 1 refills | Status: DC

## 2019-03-21 NOTE — Progress Notes (Signed)
Subjective:  Patient ID: Emily [name], female    DOB: 12/06/1965  Age: 53 y.o. MRN: [id number]  CC: Hypertension   HPI Emily [name] presents for f/up - She ran out of her hypertensives several months ago and has not requested a refill.  She has been taking taking outdated supplies of prior samples and prescriptions.  It sounds like the only thing she has been using recently is the clonidine patch and an outdated supply of nebivolol.  Outpatient Medications Prior to Visit  Medication Sig Dispense Refill  . Cholecalciferol 2000 units TABS Take 1 tablet (2,000 Units total) by mouth daily. 90 tablet 1  . cloNIDine (CATAPRES - DOSED IN MG/24 HR) 0.1 mg/24hr patch UNWRAP AND APPLY 1 PATCH TO SKIN ONCE A WEEK 12 patch 1  . cyanocobalamin 2000 MCG tablet Take 1 tablet (2,000 mcg total) by mouth daily. 90 tablet 1  . Fe Cbn-Fe Gluc-FA-B12-C-DSS (FERRALET 90) 90-1 MG TABS Take 1 tablet by mouth daily. 90 each 1  . folic acid (FOLVITE) 1 MG tablet Take 1 tablet (1 mg total) by mouth daily. 90 tablet 3  . methadone (DOLOPHINE) 10 MG tablet Take a total of 30 mg [3 tablets] TID 270 tablet 0  . potassium chloride SA (K-DUR,KLOR-CON) 20 MEQ tablet Take 1 tablet (20 mEq total) by mouth 3 (three) times daily. 270 tablet 1  . sertraline (ZOLOFT) 100 MG tablet TAKE 1 TABLET IN THE MORNING AND 1/2 TAB IN THE EVENING. 45 tablet 2  . atorvastatin (LIPITOR) 10 MG tablet Take 1 tablet (10 mg total) by mouth daily. 90 tablet 1  . chlorthalidone (HYGROTON) 25 MG tablet Take 1 tablet (25 mg total) by mouth daily. 90 tablet 1  . nebivolol (BYSTOLIC) 10 MG tablet Take 1 tablet (10 mg total) by mouth daily. 84 tablet 0  . olmesartan (BENICAR) 40 MG tablet Take 1 tablet (40 mg total) by mouth daily. 90 tablet 1  . omega-3 acid ethyl esters (LOVAZA) 1 g capsule Take 2 capsules (2 g total) by mouth 2 (two) times daily. 360 capsule 1   No facility-administered medications prior to visit.     ROS Review of  Systems  Constitutional: Negative for diaphoresis, fatigue and unexpected weight change.  HENT: Negative.  Negative for sore throat and trouble swallowing.   Eyes: Negative for visual disturbance.  Respiratory: Negative for cough, chest tightness, shortness of breath and wheezing.   Cardiovascular: Negative for chest pain, palpitations and leg swelling.  Gastrointestinal: Negative for abdominal pain, constipation, diarrhea, nausea and vomiting.  Endocrine: Negative.   Genitourinary: Negative.  Negative for difficulty urinating, dysuria and hematuria.  Musculoskeletal: Positive for neck pain. Negative for arthralgias.       She continues to complain of neck pain that is adequately well controlled with methadone.  She is uninsured and does not have the money to undergo cervical spine surgery or epidural steroid injections.  Skin: Negative.  Negative for color change and pallor.  Neurological: Negative.  Negative for dizziness, seizures, weakness, light-headedness, numbness and headaches.  Hematological: Negative for adenopathy. Does not bruise/bleed easily.  Psychiatric/Behavioral: Negative.  Negative for dysphoric mood and sleep disturbance. The patient is not nervous/anxious.     Objective:  BP (!) 160/110 (BP Location: Left Arm, Patient Position: Sitting, Cuff Size: Normal)   Pulse 92   Temp 98 F (36.7 C) (Oral)   Resp 16   Ht 5' (1.524 m)   Wt 164 lb (74.4 kg)  SpO2 96%   BMI 32.03 kg/m   BP Readings from Last 3 Encounters:  03/21/19 (!) 160/110  11/21/18 (!) 218/108  07/20/18 (!) 174/110    Wt Readings from Last 3 Encounters:  03/21/19 164 lb (74.4 kg)  11/21/18 162 lb (73.5 kg)  07/20/18 167 lb (75.8 kg)    Physical Exam Vitals signs reviewed.  Constitutional:      General: She is not in acute distress.    Appearance: She is obese. She is not ill-appearing, toxic-appearing or diaphoretic.  HENT:     Nose: Nose normal.     Mouth/Throat:     Mouth: Mucous  membranes are moist.  Eyes:     General: No scleral icterus.    Conjunctiva/sclera: Conjunctivae normal.  Neck:     Musculoskeletal: Normal range of motion. No neck rigidity.  Cardiovascular:     Rate and Rhythm: Normal rate and regular rhythm.     Heart sounds: No murmur.  Pulmonary:     Effort: Pulmonary effort is normal. No respiratory distress.     Breath sounds: No stridor. No wheezing, rhonchi or rales.  Abdominal:     General: Abdomen is flat. Bowel sounds are normal. There is no distension.     Palpations: There is no hepatomegaly or splenomegaly.     Tenderness: There is no abdominal tenderness.  Musculoskeletal: Normal range of motion.     Right lower leg: No edema.     Left lower leg: No edema.  Skin:    General: Skin is warm and dry.  Neurological:     General: No focal deficit present.     Mental Status: She is alert and oriented to person, place, and time. Mental status is at baseline.  Psychiatric:        Mood and Affect: Mood normal.        Behavior: Behavior normal.        Thought Content: Thought content normal.        Judgment: Judgment normal.     Lab Results  Component Value Date   WBC 10.6 (H) 03/21/2019   HGB 13.8 03/21/2019   HCT 41.9 03/21/2019   PLT 295.0 03/21/2019   GLUCOSE 99 03/21/2019   CHOL 182 03/21/2019   TRIG 279.0 (H) 03/21/2019   HDL 29.30 (L) 03/21/2019   LDLDIRECT 110.0 03/21/2019   LDLCALC 92 10/31/2017   ALT 7 03/21/2019   AST 18 03/21/2019   NA 138 03/21/2019   K 3.8 03/21/2019   CL 100 03/21/2019   CREATININE 1.10 03/21/2019   BUN 12 03/21/2019   CO2 28 03/21/2019   TSH 3.94 11/21/2018   INR 1.0 04/23/2008   HGBA1C 5.9 05/04/2017    No results found.  Assessment & Plan:   Emily [name] was seen today for hypertension.  Diagnoses and all orders for this visit:  Essential hypertension- Her blood pressure is not adequately well controlled due to noncompliance.  I have asked her to stay on the current doses of  clonidine and nebivolol.  I have also asked her to restart chlorthalidone and telmisartan. -     chlorthalidone (HYGROTON) 25 MG tablet; Take 1 tablet (25 mg total) by mouth daily. -     nebivolol (BYSTOLIC) 10 MG tablet; Take 1 tablet (10 mg total) by mouth daily. -     olmesartan (BENICAR) 40 MG tablet; Take 1 tablet (40 mg total) by mouth daily. -     Basic metabolic panel; Future  Fatty liver- Her  liver enzymes are normal now. -     Hepatic function panel; Future  Iron deficiency anemia secondary to inadequate dietary iron intake -     CBC with Differential/Platelet; Future -     Ferritin; Future -     IBC panel; Future  Dietary folate deficiency anemia -     CBC with Differential/Platelet; Future -     Folate; Future -     Vitamin B12; Future  Drug-induced hypokalemia- Her potassium level is normal now. -     Basic metabolic panel; Future  Hypertriglyceridemia- Her triglycerides remain elevated.  I have asked her to restart the omega-3 acid ethyl esters to lower her triglycerides and to prevent complications. -     Lipid panel; Future -     omega-3 acid ethyl esters (LOVAZA) 1 g capsule; Take 2 capsules (2 g total) by mouth 2 (two) times daily.  Chronic renal disease, stage 3, moderately decreased glomerular filtration rate (GFR) between 30-59 mL/min/1.73 square meter ([place])- She will avoid nephrotoxic agents and will work on getting better control of her blood pressure.  Hyperlipidemia with target LDL less than 70- She has not achieved her LDL goal.  She is not compliant with the statin.  I have asked her to restart the statin for CV risk reduction. -     atorvastatin (LIPITOR) 10 MG tablet; Take 1 tablet (10 mg total) by mouth daily.   I am having Emily [name] maintain her sertraline, folic acid, cyanocobalamin, Cholecalciferol, Ferralet 90, potassium chloride SA, cloNIDine, methadone, chlorthalidone, nebivolol, olmesartan, omega-3 acid ethyl esters, and atorvastatin.   Meds ordered this encounter  Medications  . chlorthalidone (HYGROTON) 25 MG tablet    Sig: Take 1 tablet (25 mg total) by mouth daily.    Dispense:  90 tablet    Refill:  1  . nebivolol (BYSTOLIC) 10 MG tablet    Sig: Take 1 tablet (10 mg total) by mouth daily.    Dispense:  126 tablet    Refill:  0  . olmesartan (BENICAR) 40 MG tablet    Sig: Take 1 tablet (40 mg total) by mouth daily.    Dispense:  90 tablet    Refill:  1  . omega-3 acid ethyl esters (LOVAZA) 1 g capsule    Sig: Take 2 capsules (2 g total) by mouth 2 (two) times daily.    Dispense:  360 capsule    Refill:  1  . atorvastatin (LIPITOR) 10 MG tablet    Sig: Take 1 tablet (10 mg total) by mouth daily.    Dispense:  90 tablet    Refill:  1     Follow-up: Return in about 3 months (around 06/21/2019).  Scarlette Calico, MD

## 2019-03-21 NOTE — Patient Instructions (Signed)

## 2019-03-22 ENCOUNTER — Encounter: Payer: Self-pay | Admitting: Internal Medicine

## 2019-04-04 ENCOUNTER — Other Ambulatory Visit: Payer: Self-pay | Admitting: Internal Medicine

## 2019-04-04 DIAGNOSIS — M4802 Spinal stenosis, cervical region: Secondary | ICD-10-CM

## 2019-04-04 DIAGNOSIS — G894 Chronic pain syndrome: Secondary | ICD-10-CM

## 2019-04-05 ENCOUNTER — Encounter: Payer: Self-pay | Admitting: Internal Medicine

## 2019-04-06 ENCOUNTER — Other Ambulatory Visit: Payer: Self-pay | Admitting: Internal Medicine

## 2019-04-06 ENCOUNTER — Encounter: Payer: Self-pay | Admitting: Internal Medicine

## 2019-04-06 DIAGNOSIS — M4802 Spinal stenosis, cervical region: Secondary | ICD-10-CM

## 2019-04-06 DIAGNOSIS — G894 Chronic pain syndrome: Secondary | ICD-10-CM

## 2019-04-06 NOTE — Telephone Encounter (Signed)
Medication Refill - Medication:  methadone (DOLOPHINE) 10 MG tablet  Has the patient contacted their pharmacy? Patient stated no.   Preferred Pharmacy (with phone number or street name):  [facility], [place] [place] [phone] (Phone) [phone] (Fax)   Agent: Please be advised that RX refills may take up to 3 business days. We ask that you follow-up with your pharmacy.

## 2019-04-06 NOTE — Telephone Encounter (Signed)
Requested medication (s) are due for refill today: yes  Requested medication (s) are on the active medication list: yes Last refill: Future visit scheduled: no  Notes to clinic:  Review for refill   Requested Prescriptions  Pending Prescriptions Disp Refills   methadone (DOLOPHINE) 10 MG tablet 270 tablet 0    Sig: Take a total of 30 mg [3 tablets] TID     Not Delegated - Analgesics:  Opioid Agonists Failed - 04/06/2019  2:50 PM      Failed - This refill cannot be delegated      Passed - Urine Drug Screen completed in last 360 days.      Passed - Valid encounter within last 6 months    Recent Outpatient Visits          2 weeks ago Essential hypertension   [place], Thomas L, MD   4 months ago Essential hypertension   [place], Thomas L, MD   8 months ago Breast cancer screening   [place], Thomas L, MD   11 months ago Need for influenza vaccination   [facility] -Mayer Camel, MD   1 year ago Essential hypertension   [facility] -Mayer Camel, MD

## 2019-04-07 ENCOUNTER — Encounter: Payer: Self-pay | Admitting: Internal Medicine

## 2019-04-07 ENCOUNTER — Other Ambulatory Visit: Payer: Self-pay | Admitting: Internal Medicine

## 2019-04-07 DIAGNOSIS — M4802 Spinal stenosis, cervical region: Secondary | ICD-10-CM

## 2019-04-07 DIAGNOSIS — G894 Chronic pain syndrome: Secondary | ICD-10-CM

## 2019-04-07 MED ORDER — METHADONE HCL 10 MG PO TABS: ORAL_TABLET | ORAL | 0 refills | Status: DC

## 2019-05-03 ENCOUNTER — Other Ambulatory Visit: Payer: Self-pay | Admitting: Internal Medicine

## 2019-05-03 DIAGNOSIS — G894 Chronic pain syndrome: Secondary | ICD-10-CM

## 2019-05-03 DIAGNOSIS — M4802 Spinal stenosis, cervical region: Secondary | ICD-10-CM

## 2019-05-06 ENCOUNTER — Encounter: Payer: Self-pay | Admitting: Internal Medicine

## 2019-05-07 MED ORDER — METHADONE HCL 10 MG PO TABS: ORAL_TABLET | ORAL | 0 refills | Status: DC

## 2019-06-04 ENCOUNTER — Other Ambulatory Visit: Payer: Self-pay | Admitting: Internal Medicine

## 2019-06-04 DIAGNOSIS — M4802 Spinal stenosis, cervical region: Secondary | ICD-10-CM

## 2019-06-04 DIAGNOSIS — G894 Chronic pain syndrome: Secondary | ICD-10-CM

## 2019-06-05 ENCOUNTER — Encounter: Payer: Self-pay | Admitting: Internal Medicine

## 2019-06-05 MED ORDER — METHADONE HCL 10 MG PO TABS: ORAL_TABLET | ORAL | 0 refills | Status: DC

## 2019-07-03 ENCOUNTER — Other Ambulatory Visit: Payer: Self-pay | Admitting: Internal Medicine

## 2019-07-03 ENCOUNTER — Ambulatory Visit: Payer: Self-pay | Admitting: Internal Medicine

## 2019-07-03 DIAGNOSIS — G894 Chronic pain syndrome: Secondary | ICD-10-CM

## 2019-07-03 DIAGNOSIS — M4802 Spinal stenosis, cervical region: Secondary | ICD-10-CM

## 2019-07-04 ENCOUNTER — Encounter: Payer: Self-pay | Admitting: Internal Medicine

## 2019-07-04 MED ORDER — METHADONE HCL 10 MG PO TABS: ORAL_TABLET | ORAL | 0 refills | Status: DC

## 2019-07-12 ENCOUNTER — Ambulatory Visit: Payer: Self-pay | Admitting: Internal Medicine

## 2019-07-17 ENCOUNTER — Ambulatory Visit: Payer: Self-pay | Admitting: Internal Medicine

## 2019-07-18 ENCOUNTER — Encounter: Payer: Self-pay | Admitting: Internal Medicine

## 2019-07-24 ENCOUNTER — Ambulatory Visit: Payer: Self-pay | Admitting: Internal Medicine

## 2019-07-26 ENCOUNTER — Ambulatory Visit: Payer: Self-pay | Admitting: Internal Medicine

## 2019-08-01 ENCOUNTER — Other Ambulatory Visit: Payer: Medicaid Other

## 2019-08-01 ENCOUNTER — Other Ambulatory Visit: Payer: Self-pay

## 2019-08-01 ENCOUNTER — Encounter: Payer: Self-pay | Admitting: Internal Medicine

## 2019-08-01 ENCOUNTER — Ambulatory Visit (INDEPENDENT_AMBULATORY_CARE_PROVIDER_SITE_OTHER): Payer: Self-pay

## 2019-08-01 ENCOUNTER — Ambulatory Visit (INDEPENDENT_AMBULATORY_CARE_PROVIDER_SITE_OTHER): Payer: Self-pay | Admitting: Internal Medicine

## 2019-08-01 ENCOUNTER — Encounter: Payer: Self-pay | Admitting: Family Medicine

## 2019-08-01 VITALS — BP 180/110 | HR 133 | Temp 98.0°F | Resp 16 | Ht 60.0 in | Wt 166.0 lb

## 2019-08-01 DIAGNOSIS — R059 Cough, unspecified: Secondary | ICD-10-CM

## 2019-08-01 DIAGNOSIS — R000 Tachycardia, unspecified: Secondary | ICD-10-CM

## 2019-08-01 DIAGNOSIS — R05 Cough: Secondary | ICD-10-CM

## 2019-08-01 DIAGNOSIS — I10 Essential (primary) hypertension: Secondary | ICD-10-CM

## 2019-08-01 DIAGNOSIS — G894 Chronic pain syndrome: Secondary | ICD-10-CM

## 2019-08-01 DIAGNOSIS — M4802 Spinal stenosis, cervical region: Secondary | ICD-10-CM

## 2019-08-01 DIAGNOSIS — Z79891 Long term (current) use of opiate analgesic: Secondary | ICD-10-CM

## 2019-08-01 DIAGNOSIS — Z23 Encounter for immunization: Secondary | ICD-10-CM

## 2019-08-01 MED ORDER — CLONIDINE 0.1 MG/24HR TD PTWK: MEDICATED_PATCH | TRANSDERMAL | 1 refills | Status: DC

## 2019-08-01 MED ORDER — METHADONE HCL 10 MG PO TABS: ORAL_TABLET | ORAL | 0 refills | Status: DC

## 2019-08-01 MED ORDER — INDAPAMIDE 2.5 MG PO TABS: 2.5000 mg | ORAL_TABLET | Freq: Every day | ORAL | 1 refills | Status: DC

## 2019-08-01 MED ORDER — CARVEDILOL 6.25 MG PO TABS: 6.2500 mg | ORAL_TABLET | Freq: Two times a day (BID) | ORAL | 1 refills | Status: DC

## 2019-08-01 NOTE — Progress Notes (Signed)
Subjective:  Patient ID: Emily [name], female    DOB: 1966-04-24  Age: 53 y.o. MRN: [id number]  CC: Hypertension and Cough   HPI Emily [name] presents for f/up -The only antihypertensive she is using is the clonidine patch.  She is not taking the other antihypertensives because she claims a they cause side effects (see her recent MyChart message).  She complains of a 1 week history of cough that is intermittently productive of yellow-green phlegm.  She tells me that she was tested for Covid in [place] at a [facility] and the test was negative.  She initially had fever and chills but she tells me those symptoms have resolved.  She denies chest pain, hemoptysis, shortness of breath, or wheezing.  Outpatient Medications Prior to Visit  Medication Sig Dispense Refill  . atorvastatin (LIPITOR) 10 MG tablet Take 1 tablet (10 mg total) by mouth daily. 90 tablet 1  . benzonatate (TESSALON) 100 MG capsule Take 100 mg by mouth 3 (three) times daily as needed for cough.    . Fe Cbn-Fe Gluc-FA-B12-C-DSS (FERRALET 90) 90-1 MG TABS Take 1 tablet by mouth daily. 90 each 1  . folic acid (FOLVITE) 1 MG tablet Take 1 tablet (1 mg total) by mouth daily. 90 tablet 3  . omega-3 acid ethyl esters (LOVAZA) 1 g capsule Take 2 capsules (2 g total) by mouth 2 (two) times daily. 360 capsule 1  . potassium chloride SA (K-DUR,KLOR-CON) 20 MEQ tablet Take 1 tablet (20 mEq total) by mouth 3 (three) times daily. 270 tablet 1  . cloNIDine (CATAPRES - DOSED IN MG/24 HR) 0.1 mg/24hr patch UNWRAP AND APPLY 1 PATCH TO SKIN ONCE A WEEK 12 patch 1  . methadone (DOLOPHINE) 10 MG tablet Take a total of 30 mg [3 tablets] TID 270 tablet 0  . Cholecalciferol 2000 units TABS Take 1 tablet (2,000 Units total) by mouth daily. (Patient not taking: Reported on 08/01/2019) 90 tablet 1  . cyanocobalamin 2000 MCG tablet Take 1 tablet (2,000 mcg total) by mouth daily. 90 tablet 1  . chlorthalidone (HYGROTON) 25 MG tablet Take 1 tablet  (25 mg total) by mouth daily. (Patient not taking: Reported on 08/01/2019) 90 tablet 1  . nebivolol (BYSTOLIC) 10 MG tablet Take 1 tablet (10 mg total) by mouth daily. (Patient not taking: Reported on 08/01/2019) 126 tablet 0  . olmesartan (BENICAR) 40 MG tablet Take 1 tablet (40 mg total) by mouth daily. (Patient not taking: Reported on 08/01/2019) 90 tablet 1  . sertraline (ZOLOFT) 100 MG tablet TAKE 1 TABLET IN THE MORNING AND 1/2 TAB IN THE EVENING. (Patient not taking: Reported on 08/01/2019) 45 tablet 2   No facility-administered medications prior to visit.    ROS Review of Systems  Constitutional: Negative for chills, fatigue and fever.  HENT: Negative.   Eyes: Negative for visual disturbance.  Respiratory: Positive for cough. Negative for chest tightness, shortness of breath and wheezing.   Cardiovascular: Negative for chest pain and palpitations.  Gastrointestinal: Negative for abdominal pain, constipation, diarrhea, nausea and vomiting.  Endocrine: Negative.   Genitourinary: Negative.  Negative for difficulty urinating.  Musculoskeletal: Positive for neck pain. Negative for arthralgias.  Skin: Negative for color change and rash.  Neurological: Negative.  Negative for dizziness, weakness, light-headedness and headaches.  Hematological: Negative for adenopathy. Does not bruise/bleed easily.  Psychiatric/Behavioral: Negative.     Objective:  BP (!) 180/110 (BP Location: Left Arm, Patient Position: Sitting, Cuff Size: Small)   Pulse Marland Kitchen)  133   Temp 98 F (36.7 C) (Oral)   Resp 16   Ht 5' (1.524 m)   Wt 166 lb (75.3 kg)   SpO2 99%   BMI 32.42 kg/m   BP Readings from Last 3 Encounters:  08/01/19 (!) 180/110  03/21/19 (!) 160/110  11/21/18 (!) 218/108    Wt Readings from Last 3 Encounters:  08/01/19 166 lb (75.3 kg)  03/21/19 164 lb (74.4 kg)  11/21/18 162 lb (73.5 kg)    Physical Exam Vitals reviewed.  Constitutional:      Appearance: Normal appearance.  HENT:       Nose: Nose normal.     Mouth/Throat:     Mouth: Mucous membranes are moist.  Eyes:     General: No scleral icterus.    Conjunctiva/sclera: Conjunctivae normal.  Cardiovascular:     Rate and Rhythm: Normal rate and regular rhythm.     Heart sounds: No murmur. No friction rub.     Comments: EKG -    Sinus tachycardia.  Right atrial enlargement.  Nonspecific ST changes.  No changes compared to prior EKG. Pulmonary:     Effort: Pulmonary effort is normal.     Breath sounds: No stridor. No wheezing, rhonchi or rales.  Abdominal:     General: Abdomen is flat. Bowel sounds are normal.     Palpations: There is no hepatomegaly, splenomegaly or mass.     Tenderness: There is no abdominal tenderness. There is no guarding.  Musculoskeletal:     Cervical back: Neck supple.     Right lower leg: No edema.     Left lower leg: No edema.  Lymphadenopathy:     Cervical: No cervical adenopathy.  Neurological:     General: No focal deficit present.  Psychiatric:        Mood and Affect: Mood normal.        Behavior: Behavior normal.     Lab Results  Component Value Date   WBC 10.6 (H) 03/21/2019   HGB 13.8 03/21/2019   HCT 41.9 03/21/2019   PLT 295.0 03/21/2019   GLUCOSE 99 03/21/2019   CHOL 182 03/21/2019   TRIG 279.0 (H) 03/21/2019   HDL 29.30 (L) 03/21/2019   LDLDIRECT 110.0 03/21/2019   LDLCALC 92 10/31/2017   ALT 7 03/21/2019   AST 18 03/21/2019   NA 138 03/21/2019   K 3.8 03/21/2019   CL 100 03/21/2019   CREATININE 1.10 03/21/2019   BUN 12 03/21/2019   CO2 28 03/21/2019   TSH 3.94 11/21/2018   INR 1.0 04/23/2008   HGBA1C 5.9 05/04/2017    DG Chest 2 View  Result Date: 08/01/2019 CLINICAL DATA:  Cough and fever. EXAM: CHEST - 2 VIEW COMPARISON:  November 03, 2009 FINDINGS: The lungs are clear. The heart size and pulmonary vascularity are normal. No adenopathy. No bone lesions. IMPRESSION: No edema or consolidation. Electronically Signed   By: Lowella Grip III M.D.    On: 08/01/2019 13:43    Assessment & Plan:   Emily [name] was seen today for hypertension and cough.  Diagnoses and all orders for this visit:  Encounter for long-term opiate analgesic use -     Pain Mgmt, Methadone Conf w/mM, U -     Pain Mgmt, Profile 8 w/Conf, U  Long-term current use of opiate analgesic-I will check her urine drug screen to monitor for compliance and to screen for substance abuse. -     Pain Mgmt, Methadone Conf w/mM, U -  Pain Mgmt, Profile 8 w/Conf, U  Cough-chest x-ray was negative for infiltrate.  She tells me her symptoms are improving.  This is likely a viral URI. -     DG Chest 2 View; Future  Tachycardia -     EKG 12-Lead  Essential hypertension- Her blood pressure is not adequately well controlled.  I will  switch nebivolol to carvedilol and chlorthalidone to indapamide to see if she can find an antihypertensive regimen that does not cause side effects. -     EKG 12-Lead -     cloNIDine (CATAPRES - DOSED IN MG/24 HR) 0.1 mg/24hr patch; UNWRAP AND APPLY 1 PATCH TO SKIN ONCE A WEEK -     indapamide (LOZOL) 2.5 MG tablet; Take 1 tablet (2.5 mg total) by mouth daily. -     carvedilol (COREG) 6.25 MG tablet; Take 1 tablet (6.25 mg total) by mouth 2 (two) times daily with a meal.  Needs flu shot -     Flu Vaccine QUAD 36+ mos PF IM (Fluarix Quad PF)  Chronic pain syndrome -     methadone (DOLOPHINE) 10 MG tablet; Take a total of 30 mg [3 tablets] TID  Cervical spinal stenosis -     methadone (DOLOPHINE) 10 MG tablet; Take a total of 30 mg [3 tablets] TID   I have discontinued [facility]icia E. [facility]'s sertraline, chlorthalidone, nebivolol, and olmesartan. I am also having her start on indapamide and carvedilol. Additionally, I am having her maintain her folic acid, cyanocobalamin, Cholecalciferol, Ferralet 90, potassium chloride SA, omega-3 acid ethyl esters, atorvastatin, benzonatate, cloNIDine, and methadone.  Meds ordered this encounter  Medications  .  cloNIDine (CATAPRES - DOSED IN MG/24 HR) 0.1 mg/24hr patch    Sig: UNWRAP AND APPLY 1 PATCH TO SKIN ONCE A WEEK    Dispense:  12 patch    Refill:  1  . indapamide (LOZOL) 2.5 MG tablet    Sig: Take 1 tablet (2.5 mg total) by mouth daily.    Dispense:  90 tablet    Refill:  1  . carvedilol (COREG) 6.25 MG tablet    Sig: Take 1 tablet (6.25 mg total) by mouth 2 (two) times daily with a meal.    Dispense:  180 tablet    Refill:  1  . methadone (DOLOPHINE) 10 MG tablet    Sig: Take a total of 30 mg [3 tablets] TID    Dispense:  270 tablet    Refill:  0     Follow-up: Return in 4 weeks (on 08/29/2019), or if symptoms worsen or fail to improve.  Scarlette Calico, MD

## 2019-08-01 NOTE — Patient Instructions (Signed)

## 2019-08-02 ENCOUNTER — Encounter: Payer: Self-pay | Admitting: Internal Medicine

## 2019-08-04 LAB — PAIN MGMT, PROFILE 8 W/CONF, U
6 Acetylmorphine: NEGATIVE ng/mL
Alcohol Metabolites: NEGATIVE ng/mL (ref <500)
Amphetamines: NEGATIVE ng/mL
Benzodiazepines: NEGATIVE ng/mL
Buprenorphine, Urine: NEGATIVE ng/mL
Cocaine Metabolite: NEGATIVE ng/mL
Creatinine: 38.4 mg/dL
MDMA: NEGATIVE ng/mL
Marijuana Metabolite: NEGATIVE ng/mL
Opiates: NEGATIVE ng/mL
Oxidant: NEGATIVE ug/mL
Oxycodone: NEGATIVE ng/mL
pH: 5.1 (ref 4.5–9.0)

## 2019-08-04 LAB — PAIN MGMT, METHADONE CONF W/MM, U
EDDP: 7790 ng/mL
Methadone Metabolite: POSITIVE ng/mL
Methadone: 10267 ng/mL

## 2019-08-29 ENCOUNTER — Other Ambulatory Visit: Payer: Self-pay | Admitting: Internal Medicine

## 2019-08-29 DIAGNOSIS — M4802 Spinal stenosis, cervical region: Secondary | ICD-10-CM

## 2019-08-29 DIAGNOSIS — G894 Chronic pain syndrome: Secondary | ICD-10-CM

## 2019-08-29 NOTE — Telephone Encounter (Signed)
Check [place] registry last filled 08/01/2019.Marland KitchenJohny [name]

## 2019-08-30 ENCOUNTER — Other Ambulatory Visit: Payer: Self-pay | Admitting: Internal Medicine

## 2019-08-30 ENCOUNTER — Encounter: Payer: Self-pay | Admitting: Internal Medicine

## 2019-08-30 DIAGNOSIS — M4802 Spinal stenosis, cervical region: Secondary | ICD-10-CM

## 2019-08-30 DIAGNOSIS — G894 Chronic pain syndrome: Secondary | ICD-10-CM

## 2019-08-30 MED ORDER — METHADONE HCL 10 MG PO TABS: ORAL_TABLET | ORAL | 0 refills | Status: DC

## 2019-09-27 ENCOUNTER — Other Ambulatory Visit: Payer: Self-pay | Admitting: Internal Medicine

## 2019-09-27 ENCOUNTER — Encounter: Payer: Self-pay | Admitting: Internal Medicine

## 2019-09-27 DIAGNOSIS — M4802 Spinal stenosis, cervical region: Secondary | ICD-10-CM

## 2019-09-27 DIAGNOSIS — G894 Chronic pain syndrome: Secondary | ICD-10-CM

## 2019-09-28 MED ORDER — METHADONE HCL 10 MG PO TABS: ORAL_TABLET | ORAL | 0 refills | Status: DC

## 2019-10-24 ENCOUNTER — Other Ambulatory Visit: Payer: Self-pay | Admitting: Internal Medicine

## 2019-10-24 ENCOUNTER — Encounter: Payer: Self-pay | Admitting: Internal Medicine

## 2019-10-24 DIAGNOSIS — M4802 Spinal stenosis, cervical region: Secondary | ICD-10-CM

## 2019-10-24 DIAGNOSIS — G894 Chronic pain syndrome: Secondary | ICD-10-CM

## 2019-10-25 MED ORDER — METHADONE HCL 10 MG PO TABS: ORAL_TABLET | ORAL | 0 refills | Status: DC

## 2019-11-19 ENCOUNTER — Ambulatory Visit: Payer: Self-pay | Admitting: Internal Medicine

## 2019-11-21 ENCOUNTER — Encounter: Payer: Self-pay | Admitting: Internal Medicine

## 2019-11-22 ENCOUNTER — Ambulatory Visit (INDEPENDENT_AMBULATORY_CARE_PROVIDER_SITE_OTHER): Payer: Self-pay | Admitting: Internal Medicine

## 2019-11-22 ENCOUNTER — Other Ambulatory Visit: Payer: Self-pay

## 2019-11-22 ENCOUNTER — Encounter: Payer: Self-pay | Admitting: Internal Medicine

## 2019-11-22 VITALS — BP 200/140 | HR 94 | Temp 99.0°F | Ht 60.0 in | Wt 172.0 lb

## 2019-11-22 DIAGNOSIS — G894 Chronic pain syndrome: Secondary | ICD-10-CM

## 2019-11-22 DIAGNOSIS — D508 Other iron deficiency anemias: Secondary | ICD-10-CM

## 2019-11-22 DIAGNOSIS — R7989 Other specified abnormal findings of blood chemistry: Secondary | ICD-10-CM

## 2019-11-22 DIAGNOSIS — L989 Disorder of the skin and subcutaneous tissue, unspecified: Secondary | ICD-10-CM | POA: Insufficient documentation

## 2019-11-22 DIAGNOSIS — Z23 Encounter for immunization: Secondary | ICD-10-CM

## 2019-11-22 DIAGNOSIS — Z79891 Long term (current) use of opiate analgesic: Secondary | ICD-10-CM

## 2019-11-22 DIAGNOSIS — I10 Essential (primary) hypertension: Secondary | ICD-10-CM

## 2019-11-22 DIAGNOSIS — M4802 Spinal stenosis, cervical region: Secondary | ICD-10-CM

## 2019-11-22 LAB — URINALYSIS, ROUTINE W REFLEX MICROSCOPIC
Bilirubin Urine: NEGATIVE
Ketones, ur: NEGATIVE
Leukocytes,Ua: NEGATIVE
Nitrite: NEGATIVE
Specific Gravity, Urine: 1.01 (ref 1.000–1.030)
Total Protein, Urine: NEGATIVE
Urine Glucose: NEGATIVE
Urobilinogen, UA: 0.2 (ref 0.0–1.0)
pH: 7 (ref 5.0–8.0)

## 2019-11-22 LAB — BASIC METABOLIC PANEL
BUN: 12 mg/dL (ref 6–23)
CO2: 31 mEq/L (ref 19–32)
Calcium: 9.9 mg/dL (ref 8.4–10.5)
Chloride: 99 mEq/L (ref 96–112)
Creatinine, Ser: 1.06 mg/dL (ref 0.40–1.20)
GFR: 53.82 mL/min — ABNORMAL LOW (ref >60.00)
Glucose, Bld: 108 mg/dL — ABNORMAL HIGH (ref 70–99)
Potassium: 3.9 mEq/L (ref 3.5–5.1)
Sodium: 137 mEq/L (ref 135–145)

## 2019-11-22 LAB — CBC WITH DIFFERENTIAL/PLATELET
Basophils Absolute: 0.1 10*3/uL (ref 0.0–0.1)
Basophils Relative: 1.2 % (ref 0.0–3.0)
Eosinophils Absolute: 0.2 10*3/uL (ref 0.0–0.7)
Eosinophils Relative: 2.1 % (ref 0.0–5.0)
HCT: 41.8 % (ref 36.0–46.0)
Hemoglobin: 13.8 g/dL (ref 12.0–15.0)
Lymphocytes Relative: 19.3 % (ref 12.0–46.0)
Lymphs Abs: 1.8 10*3/uL (ref 0.7–4.0)
MCHC: 33 g/dL (ref 30.0–36.0)
MCV: 88.4 fl (ref 78.0–100.0)
Monocytes Absolute: 0.7 10*3/uL (ref 0.1–1.0)
Monocytes Relative: 7.7 % (ref 3.0–12.0)
Neutro Abs: 6.6 10*3/uL (ref 1.4–7.7)
Neutrophils Relative %: 69.7 % (ref 43.0–77.0)
Platelets: 263 10*3/uL (ref 150.0–400.0)
RBC: 4.73 Mil/uL (ref 3.87–5.11)
RDW: 14.8 % (ref 11.5–15.5)
WBC: 9.5 10*3/uL (ref 4.0–10.5)

## 2019-11-22 LAB — IBC PANEL
Iron: 42 ug/dL (ref 42–145)
Saturation Ratios: 9.4 % — ABNORMAL LOW (ref 20.0–50.0)
Transferrin: 319 mg/dL (ref 212.0–360.0)

## 2019-11-22 LAB — CORTISOL: Cortisol, Plasma: 19.2 ug/dL

## 2019-11-22 LAB — TSH: TSH: 3.04 u[IU]/mL (ref 0.35–4.50)

## 2019-11-22 LAB — FERRITIN: Ferritin: 31.9 ng/mL (ref 10.0–291.0)

## 2019-11-22 MED ORDER — METHADONE HCL 10 MG PO TABS: ORAL_TABLET | ORAL | 0 refills | Status: DC

## 2019-11-22 NOTE — Progress Notes (Signed)
Subjective:  Patient ID: AUBRIEL KHANNA, female    DOB: [birth year]  Age: 54 y.o. MRN: [id number]  CC: Hypertension  This visit occurred during the SARS-CoV-2 public health emergency.  Safety protocols were in place, including screening questions prior to the visit, additional usage of staff PPE, and extensive cleaning of exam room while observing appropriate contact time as indicated for disinfecting solutions.    HPI BAHJA BENCE presents for f/up - She continues to complain of chronic, nonradiating neck pain.  She requests a refill on methadone.  She is uninsured and cannot afford to do physical therapy or surgery.  She tells me she is taking her current antihypertensives including carvedilol, clonidine, indapamide.  She has gained weight since her last visit.  She tells me that her blood pressure is around 130/90 when she checks it at home.  She denies any recent episodes of headache, blurred vision, chest pain, shortness of breath, or edema.  She has a lesion on the bottom of her left foot that has been present for over a year.  It is growing and sometimes uncomfortable.  Outpatient Medications Prior to Visit  Medication Sig Dispense Refill  . atorvastatin (LIPITOR) 10 MG tablet Take 1 tablet (10 mg total) by mouth daily. 90 tablet 1  . Cholecalciferol 2000 units TABS Take 1 tablet (2,000 Units total) by mouth daily. 90 tablet 1  . cyanocobalamin 2000 MCG tablet Take 1 tablet (2,000 mcg total) by mouth daily. 90 tablet 1  . indapamide (LOZOL) 2.5 MG tablet Take 1 tablet (2.5 mg total) by mouth daily. 90 tablet 1  . omega-3 acid ethyl esters (LOVAZA) 1 g capsule Take 2 capsules (2 g total) by mouth 2 (two) times daily. 360 capsule 1  . potassium chloride SA (K-DUR,KLOR-CON) 20 MEQ tablet Take 1 tablet (20 mEq total) by mouth 3 (three) times daily. 270 tablet 1  . benzonatate (TESSALON) 100 MG capsule Take 100 mg by mouth 3 (three) times daily as needed for cough.    . carvedilol  (COREG) 6.25 MG tablet Take 1 tablet (6.25 mg total) by mouth 2 (two) times daily with a meal. 180 tablet 1  . cloNIDine (CATAPRES - DOSED IN MG/24 HR) 0.1 mg/24hr patch UNWRAP AND APPLY 1 PATCH TO SKIN ONCE A WEEK 12 patch 1  . Fe Cbn-Fe Gluc-FA-B12-C-DSS (FERRALET 90) 90-1 MG TABS Take 1 tablet by mouth daily. 90 each 1  . folic acid (FOLVITE) 1 MG tablet Take 1 tablet (1 mg total) by mouth daily. 90 tablet 3  . methadone (DOLOPHINE) 10 MG tablet Take a total of 30 mg [3 tablets] TID 270 tablet 0   No facility-administered medications prior to visit.    ROS Review of Systems  Constitutional: Positive for unexpected weight change. Negative for appetite change, diaphoresis and fatigue.  HENT: Negative.   Eyes: Negative.  Negative for photophobia and visual disturbance.  Respiratory: Negative for cough, chest tightness, shortness of breath and wheezing.   Cardiovascular: Negative for chest pain, palpitations and leg swelling.  Gastrointestinal: Negative for abdominal pain, constipation, diarrhea, nausea and vomiting.  Endocrine: Negative.   Genitourinary: Negative.  Negative for difficulty urinating.  Musculoskeletal: Positive for neck pain. Negative for arthralgias and back pain.  Skin: Negative for color change and rash.  Neurological: Negative.  Negative for dizziness, weakness, light-headedness, numbness and headaches.  Hematological: Negative for adenopathy. Does not bruise/bleed easily.  Psychiatric/Behavioral: Negative.     Objective:  BP (!) 200/140 (BP Location:  Left Arm, Patient Position: Sitting, Cuff Size: Large)   Pulse 94   Temp 99 F (37.2 C) (Oral)   Ht 5' (1.524 m)   Wt 172 lb (78 kg)   SpO2 98%   BMI 33.59 kg/m   BP Readings from Last 3 Encounters:  11/22/19 (!) 200/140  08/01/19 (!) 180/110  03/21/19 (!) 160/110    Wt Readings from Last 3 Encounters:  11/22/19 172 lb (78 kg)  08/01/19 166 lb (75.3 kg)  03/21/19 164 lb (74.4 kg)    Physical  Exam Vitals reviewed.  Constitutional:      Appearance: She is obese. She is not ill-appearing or diaphoretic.  HENT:     Mouth/Throat:     Mouth: Mucous membranes are moist.  Eyes:     General: No scleral icterus.    Conjunctiva/sclera: Conjunctivae normal.  Cardiovascular:     Rate and Rhythm: Normal rate and regular rhythm.     Heart sounds: No murmur.  Pulmonary:     Effort: Pulmonary effort is normal.     Breath sounds: No stridor. No wheezing, rhonchi or rales.  Abdominal:     General: Abdomen is flat. Bowel sounds are normal. There is no distension.     Palpations: Abdomen is soft. There is no hepatomegaly, splenomegaly or mass.     Tenderness: There is no abdominal tenderness.  Musculoskeletal:        General: Normal range of motion.     Cervical back: Neck supple.     Right lower leg: No edema.     Left lower leg: No edema.     Left foot: Deformity present.     Comments: There is an exophytic lesion over the plantar side, arch of the left foot.  It measures about 1 cm and is hyperkeratotic.  It looks suspicious for callus versus verrucous lesion.  Lymphadenopathy:     Cervical: No cervical adenopathy.  Skin:    General: Skin is warm and dry.     Coloration: Skin is not pale.  Neurological:     General: No focal deficit present.     Mental Status: She is alert and oriented to person, place, and time. Mental status is at baseline.  Psychiatric:        Mood and Affect: Mood normal.        Behavior: Behavior normal.        Thought Content: Thought content normal.        Judgment: Judgment normal.     Lab Results  Component Value Date   WBC 9.5 11/22/2019   HGB 13.8 11/22/2019   HCT 41.8 11/22/2019   PLT 263.0 11/22/2019   GLUCOSE 108 (H) 11/22/2019   CHOL 182 03/21/2019   TRIG 279.0 (H) 03/21/2019   HDL 29.30 (L) 03/21/2019   LDLDIRECT 110.0 03/21/2019   LDLCALC 92 10/31/2017   ALT 7 03/21/2019   AST 18 03/21/2019   NA 137 11/22/2019   K 3.9 11/22/2019    CL 99 11/22/2019   CREATININE 1.06 11/22/2019   BUN 12 11/22/2019   CO2 31 11/22/2019   TSH 3.04 11/22/2019   INR 1.0 04/23/2008   HGBA1C 5.9 05/04/2017    No results found.  Assessment & Plan:   Riata was seen today for hypertension.  Diagnoses and all orders for this visit:  Skin lesion of foot -     Ambulatory referral to Podiatry  Essential hypertension- Her blood pressure is too high and she has hematuria.  Her renal function is stable.  I spoke to her pharmacist in [place] - she told me that the clonidine patch prescription was written in November but she never picked it up.  In the last month she has picked up prescriptions for indapamide and carvedilol.  I recommended that she stay on the current dose of indapamide.  Will increase the dose of carvedilol.  I have asked her to start using her clonidine patch and to try olmesartan again.  Her cortisol level is mildly elevated so I have asked her to see endocrinology to be evaluated for adrenal excess. -     Basic metabolic panel; Future -     TSH; Future -     Urinalysis, Routine w reflex microscopic; Future -     Cortisol; Future -     Cortisol -     Urinalysis, Routine w reflex microscopic -     TSH -     Basic metabolic panel -     cloNIDine (CATAPRES - DOSED IN MG/24 HR) 0.1 mg/24hr patch; UNWRAP AND APPLY 1 PATCH TO SKIN ONCE A WEEK -     olmesartan (BENICAR) 40 MG tablet; Take 1 tablet (40 mg total) by mouth daily. -     carvedilol (COREG) 12.5 MG tablet; Take 1 tablet (12.5 mg total) by mouth 2 (two) times daily with a meal.  Iron deficiency anemia secondary to inadequate dietary iron intake- Her H&H are normal now. -     CBC with Differential/Platelet; Future -     IBC panel; Future -     Ferritin; Future -     Ferritin -     IBC panel -     CBC with Differential/Platelet  Chronic pain syndrome -     methadone (DOLOPHINE) 10 MG tablet; Take a total of 30 mg [3 tablets] TID  Cervical spinal stenosis -      methadone (DOLOPHINE) 10 MG tablet; Take a total of 30 mg [3 tablets] TID  Long-term current use of opiate analgesic- Her urine drug screen is consistent with the prescribed medications and there is no evidence of alcohol intake or substance abuse. -     Pain Mgmt, Profile 8 w/Conf, U; Future -     Pain Mgmt, Methadone Conf w/mM, U; Future -     Pain Mgmt, Methadone Conf w/mM, U -     Pain Mgmt, Profile 8 w/Conf, U  Need for diphtheria-tetanus-pertussis (Tdap) vaccine -     Tdap vaccine greater than or equal to 7yo IM  High serum cortisol -     Ambulatory referral to Endocrinology   I have discontinued Sharyn Lull E. Gilbert's folic acid, Ferralet 90, benzonatate, and carvedilol. I am also having her start on olmesartan and carvedilol. Additionally, I am having her maintain her cyanocobalamin, Cholecalciferol, potassium chloride SA, omega-3 acid ethyl esters, atorvastatin, indapamide, methadone, and cloNIDine.  Meds ordered this encounter  Medications  . methadone (DOLOPHINE) 10 MG tablet    Sig: Take a total of 30 mg [3 tablets] TID    Dispense:  270 tablet    Refill:  0    Dispense on Saturday 11/24/2019  . cloNIDine (CATAPRES - DOSED IN MG/24 HR) 0.1 mg/24hr patch    Sig: UNWRAP AND APPLY 1 PATCH TO SKIN ONCE A WEEK    Dispense:  12 patch    Refill:  1  . olmesartan (BENICAR) 40 MG tablet    Sig: Take 1 tablet (40 mg total) by mouth  daily.    Dispense:  90 tablet    Refill:  1  . carvedilol (COREG) 12.5 MG tablet    Sig: Take 1 tablet (12.5 mg total) by mouth 2 (two) times daily with a meal.    Dispense:  180 tablet    Refill:  1   I spent 50 minutes in preparing to see the patient by review of recent labs, imaging and procedures, obtaining and reviewing separately obtained history, communicating with the patient and family or caregiver, ordering medications, tests or procedures, and documenting clinical information in the EHR including the differential Dx, treatment, and any  further evaluation and other management of 1. Skin lesion of foot 2. Essential hypertension 3. Iron deficiency anemia secondary to inadequate dietary iron intake 4. Chronic pain syndrome 5. Cervical spinal stenosis 6. Long-term current use of opiate analgesic 7. High serum cortisol     Follow-up: Return in about 3 months (around 02/21/2020).  Scarlette Calico, MD

## 2019-11-22 NOTE — Patient Instructions (Signed)

## 2019-11-23 ENCOUNTER — Encounter: Payer: Self-pay | Admitting: Internal Medicine

## 2019-11-23 MED ORDER — OLMESARTAN MEDOXOMIL 40 MG PO TABS: 40.0000 mg | ORAL_TABLET | Freq: Every day | ORAL | 1 refills | Status: DC

## 2019-11-23 MED ORDER — CLONIDINE 0.1 MG/24HR TD PTWK: MEDICATED_PATCH | TRANSDERMAL | 1 refills | Status: DC

## 2019-11-24 DIAGNOSIS — R7989 Other specified abnormal findings of blood chemistry: Secondary | ICD-10-CM | POA: Insufficient documentation

## 2019-11-24 DIAGNOSIS — Z23 Encounter for immunization: Secondary | ICD-10-CM | POA: Insufficient documentation

## 2019-11-24 LAB — PAIN MGMT, PROFILE 8 W/CONF, U
6 Acetylmorphine: NEGATIVE ng/mL
Alcohol Metabolites: NEGATIVE ng/mL (ref <500)
Amphetamines: NEGATIVE ng/mL
Benzodiazepines: NEGATIVE ng/mL
Buprenorphine, Urine: NEGATIVE ng/mL
Cocaine Metabolite: NEGATIVE ng/mL
Creatinine: 21.2 mg/dL
MDMA: NEGATIVE ng/mL
Marijuana Metabolite: NEGATIVE ng/mL
Opiates: NEGATIVE ng/mL
Oxidant: NEGATIVE ug/mL
Oxycodone: NEGATIVE ng/mL
pH: 7.1 (ref 4.5–9.0)

## 2019-11-24 LAB — PAIN MGMT, METHADONE CONF W/MM, U
EDDP: 3093 ng/mL
Methadone Metabolite: POSITIVE ng/mL
Methadone: 2142 ng/mL

## 2019-11-24 MED ORDER — CARVEDILOL 12.5 MG PO TABS: 12.5000 mg | ORAL_TABLET | Freq: Two times a day (BID) | ORAL | 1 refills | Status: DC

## 2019-12-20 ENCOUNTER — Other Ambulatory Visit: Payer: Self-pay | Admitting: Internal Medicine

## 2019-12-20 DIAGNOSIS — M4802 Spinal stenosis, cervical region: Secondary | ICD-10-CM

## 2019-12-20 DIAGNOSIS — G894 Chronic pain syndrome: Secondary | ICD-10-CM

## 2019-12-20 MED ORDER — METHADONE HCL 10 MG PO TABS: ORAL_TABLET | ORAL | 0 refills | Status: DC

## 2019-12-20 NOTE — Telephone Encounter (Signed)
Done erx 

## 2019-12-21 ENCOUNTER — Encounter: Payer: Self-pay | Admitting: Internal Medicine

## 2020-01-01 ENCOUNTER — Encounter: Payer: Self-pay | Admitting: Internal Medicine

## 2020-01-16 ENCOUNTER — Other Ambulatory Visit: Payer: Self-pay | Admitting: Internal Medicine

## 2020-01-16 DIAGNOSIS — M4802 Spinal stenosis, cervical region: Secondary | ICD-10-CM

## 2020-01-16 DIAGNOSIS — I10 Essential (primary) hypertension: Secondary | ICD-10-CM

## 2020-01-16 DIAGNOSIS — G894 Chronic pain syndrome: Secondary | ICD-10-CM

## 2020-01-17 ENCOUNTER — Encounter: Payer: Self-pay | Admitting: Internal Medicine

## 2020-01-17 ENCOUNTER — Other Ambulatory Visit: Payer: Self-pay | Admitting: Internal Medicine

## 2020-01-17 DIAGNOSIS — G894 Chronic pain syndrome: Secondary | ICD-10-CM

## 2020-01-17 DIAGNOSIS — M4802 Spinal stenosis, cervical region: Secondary | ICD-10-CM

## 2020-01-17 MED ORDER — METHADONE HCL 10 MG PO TABS: ORAL_TABLET | ORAL | 0 refills | Status: DC

## 2020-01-17 NOTE — Telephone Encounter (Signed)
Patient's last appointment was April 15th, 2021.   AVS states to return in July.

## 2020-01-17 NOTE — Telephone Encounter (Signed)
Pt has scheduled to see you on 01/30/2020 at 1:40pm.   Pt also wanted to let you know that pt was not able get an Endo appointment until next year.

## 2020-01-18 ENCOUNTER — Encounter: Payer: Self-pay | Admitting: Internal Medicine

## 2020-01-18 DIAGNOSIS — I10 Essential (primary) hypertension: Secondary | ICD-10-CM

## 2020-01-18 MED ORDER — INDAPAMIDE 2.5 MG PO TABS: 2.5000 mg | ORAL_TABLET | Freq: Every day | ORAL | 1 refills | Status: DC

## 2020-01-29 ENCOUNTER — Encounter: Payer: Self-pay | Admitting: Internal Medicine

## 2020-01-30 ENCOUNTER — Ambulatory Visit: Payer: Medicaid Other | Admitting: Internal Medicine

## 2020-01-31 ENCOUNTER — Ambulatory Visit: Payer: Medicaid Other | Admitting: Internal Medicine

## 2020-02-04 ENCOUNTER — Ambulatory Visit (INDEPENDENT_AMBULATORY_CARE_PROVIDER_SITE_OTHER): Payer: Self-pay | Admitting: Internal Medicine

## 2020-02-04 ENCOUNTER — Encounter: Payer: Self-pay | Admitting: Internal Medicine

## 2020-02-04 ENCOUNTER — Other Ambulatory Visit: Payer: Self-pay

## 2020-02-04 ENCOUNTER — Other Ambulatory Visit: Payer: Self-pay | Admitting: Internal Medicine

## 2020-02-04 VITALS — BP 220/110 | HR 86 | Temp 98.6°F | Resp 16 | Ht 60.0 in | Wt 168.0 lb

## 2020-02-04 DIAGNOSIS — M4802 Spinal stenosis, cervical region: Secondary | ICD-10-CM

## 2020-02-04 DIAGNOSIS — I10 Essential (primary) hypertension: Secondary | ICD-10-CM

## 2020-02-04 DIAGNOSIS — D508 Other iron deficiency anemias: Secondary | ICD-10-CM

## 2020-02-04 DIAGNOSIS — E559 Vitamin D deficiency, unspecified: Secondary | ICD-10-CM

## 2020-02-04 DIAGNOSIS — G894 Chronic pain syndrome: Secondary | ICD-10-CM

## 2020-02-04 DIAGNOSIS — K760 Fatty (change of) liver, not elsewhere classified: Secondary | ICD-10-CM

## 2020-02-04 DIAGNOSIS — E785 Hyperlipidemia, unspecified: Secondary | ICD-10-CM

## 2020-02-04 DIAGNOSIS — N1831 Chronic kidney disease, stage 3a: Secondary | ICD-10-CM

## 2020-02-04 DIAGNOSIS — E781 Pure hyperglyceridemia: Secondary | ICD-10-CM

## 2020-02-04 DIAGNOSIS — R7989 Other specified abnormal findings of blood chemistry: Secondary | ICD-10-CM

## 2020-02-04 LAB — CBC WITH DIFFERENTIAL/PLATELET
Basophils Absolute: 0.1 10*3/uL (ref 0.0–0.1)
Basophils Relative: 1.1 % (ref 0.0–3.0)
Eosinophils Absolute: 0.1 10*3/uL (ref 0.0–0.7)
Eosinophils Relative: 1.3 % (ref 0.0–5.0)
HCT: 42 % (ref 36.0–46.0)
Hemoglobin: 13.8 g/dL (ref 12.0–15.0)
Lymphocytes Relative: 19.8 % (ref 12.0–46.0)
Lymphs Abs: 2.1 10*3/uL (ref 0.7–4.0)
MCHC: 33 g/dL (ref 30.0–36.0)
MCV: 86.7 fl (ref 78.0–100.0)
Monocytes Absolute: 0.6 10*3/uL (ref 0.1–1.0)
Monocytes Relative: 5.8 % (ref 3.0–12.0)
Neutro Abs: 7.7 10*3/uL (ref 1.4–7.7)
Neutrophils Relative %: 72 % (ref 43.0–77.0)
Platelets: 274 10*3/uL (ref 150.0–400.0)
RBC: 4.84 Mil/uL (ref 3.87–5.11)
RDW: 14.8 % (ref 11.5–15.5)
WBC: 10.7 10*3/uL — ABNORMAL HIGH (ref 4.0–10.5)

## 2020-02-04 LAB — HEPATIC FUNCTION PANEL
ALT: 9 U/L (ref 0–35)
AST: 19 U/L (ref 0–37)
Albumin: 4.2 g/dL (ref 3.5–5.2)
Alkaline Phosphatase: 111 U/L (ref 39–117)
Bilirubin, Direct: 0 mg/dL (ref 0.0–0.3)
Total Bilirubin: 0.3 mg/dL (ref 0.2–1.2)
Total Protein: 7.8 g/dL (ref 6.0–8.3)

## 2020-02-04 LAB — URINALYSIS, ROUTINE W REFLEX MICROSCOPIC
Bilirubin Urine: NEGATIVE
Ketones, ur: NEGATIVE
Leukocytes,Ua: NEGATIVE
Nitrite: NEGATIVE
Specific Gravity, Urine: 1.015 (ref 1.000–1.030)
Total Protein, Urine: NEGATIVE
Urine Glucose: NEGATIVE
Urobilinogen, UA: 0.2 (ref 0.0–1.0)
pH: 7 (ref 5.0–8.0)

## 2020-02-04 LAB — BASIC METABOLIC PANEL
BUN: 9 mg/dL (ref 6–23)
CO2: 30 mEq/L (ref 19–32)
Calcium: 9.9 mg/dL (ref 8.4–10.5)
Chloride: 99 mEq/L (ref 96–112)
Creatinine, Ser: 1.04 mg/dL (ref 0.40–1.20)
GFR: 54.98 mL/min — ABNORMAL LOW (ref >60.00)
Glucose, Bld: 118 mg/dL — ABNORMAL HIGH (ref 70–99)
Potassium: 3.9 mEq/L (ref 3.5–5.1)
Sodium: 136 mEq/L (ref 135–145)

## 2020-02-04 LAB — LIPID PANEL
Cholesterol: 160 mg/dL (ref 0–200)
HDL: 25.7 mg/dL — ABNORMAL LOW (ref >39.00)
NonHDL: 133.92
Total CHOL/HDL Ratio: 6
Triglycerides: 297 mg/dL — ABNORMAL HIGH (ref 0.0–149.0)
VLDL: 59.4 mg/dL — ABNORMAL HIGH (ref 0.0–40.0)

## 2020-02-04 LAB — CORTISOL: Cortisol, Plasma: 20.9 ug/dL

## 2020-02-04 LAB — LDL CHOLESTEROL, DIRECT: Direct LDL: 78 mg/dL

## 2020-02-04 LAB — VITAMIN D 25 HYDROXY (VIT D DEFICIENCY, FRACTURES): VITD: 29.52 ng/mL — ABNORMAL LOW (ref 30.00–100.00)

## 2020-02-04 MED ORDER — METHADONE HCL 10 MG PO TABS: ORAL_TABLET | ORAL | 0 refills | Status: DC

## 2020-02-04 MED ORDER — CLONIDINE 0.2 MG/24HR TD PTWK: 0.2000 mg | MEDICATED_PATCH | TRANSDERMAL | 0 refills | Status: DC

## 2020-02-04 MED ORDER — OLMESARTAN MEDOXOMIL 40 MG PO TABS: 40.0000 mg | ORAL_TABLET | Freq: Every day | ORAL | 1 refills | Status: DC

## 2020-02-04 NOTE — Progress Notes (Signed)
Subjective:  Patient ID: Emily [name], female    DOB: 02-Sep-1965  Age: 54 y.o. MRN: [id number]  CC: Hypertension  This visit occurred during the SARS-CoV-2 public health emergency.  Safety protocols were in place, including screening questions prior to the visit, additional usage of staff PPE, and extensive cleaning of exam room while observing appropriate contact time as indicated for disinfecting solutions.    HPI Emily [name] presents for f/up -she continues to complain of neck pain and requests a refill on methadone.  She is uninsured and cannot proceed with any diagnostic evaluation to consider surgical treatment options.  She tells me she is getting adequate control with the methadone.  The neck pain does not radiate towards her extremities and she denies paresthesias.  I last saw her 2 months ago who she had a lesion on the plantar surface of her left foot.  She has not seen podiatry yet.  She tells me the lesion is resolving with topical remedies.  She also had a mildly elevated p.m. cortisol level.  I referred her to endocrinology but she said she cannot be seen any time soon.  She tells me she is compliant with her antihypertensives.  She does not monitor her blood pressure.  She denies headache, blurred vision, chest pain, shortness of breath, palpitations, edema, or fatigue.  She has not quit smoking.  Outpatient Medications Prior to Visit  Medication Sig Dispense Refill  . carvedilol (COREG) 12.5 MG tablet Take 1 tablet (12.5 mg total) by mouth 2 (two) times daily with a meal. 180 tablet 1  . Cholecalciferol 2000 units TABS Take 1 tablet (2,000 Units total) by mouth daily. 90 tablet 1  . cyanocobalamin 2000 MCG tablet Take 1 tablet (2,000 mcg total) by mouth daily. 90 tablet 1  . indapamide (LOZOL) 2.5 MG tablet Take 1 tablet (2.5 mg total) by mouth daily. 90 tablet 1  . omega-3 acid ethyl esters (LOVAZA) 1 g capsule Take 2 capsules (2 g total) by mouth 2 (two) times  daily. 360 capsule 1  . potassium chloride SA (K-DUR,KLOR-CON) 20 MEQ tablet Take 1 tablet (20 mEq total) by mouth 3 (three) times daily. 270 tablet 1  . atorvastatin (LIPITOR) 10 MG tablet Take 1 tablet (10 mg total) by mouth daily. 90 tablet 1  . cloNIDine (CATAPRES - DOSED IN MG/24 HR) 0.1 mg/24hr patch UNWRAP AND APPLY 1 PATCH TO SKIN ONCE A WEEK 12 patch 1  . methadone (DOLOPHINE) 10 MG tablet Take a total of 30 mg [3 tablets] TID 135 tablet 0  . olmesartan (BENICAR) 40 MG tablet Take 1 tablet (40 mg total) by mouth daily. 90 tablet 1   No facility-administered medications prior to visit.    ROS Review of Systems  Constitutional: Negative.  Negative for appetite change, diaphoresis, fatigue and unexpected weight change.  HENT: Negative.   Eyes: Negative for visual disturbance.  Respiratory: Negative for cough, chest tightness, shortness of breath and wheezing.   Cardiovascular: Negative for chest pain, palpitations and leg swelling.  Gastrointestinal: Negative for abdominal pain, constipation, diarrhea, nausea and vomiting.  Endocrine: Negative.   Genitourinary: Negative.  Negative for difficulty urinating, flank pain and hematuria.  Musculoskeletal: Positive for neck pain. Negative for arthralgias and back pain.  Skin: Negative.   Neurological: Negative.  Negative for dizziness, weakness, light-headedness, numbness and headaches.  Hematological: Negative for adenopathy. Does not bruise/bleed easily.  Psychiatric/Behavioral: Negative.     Objective:  BP (!) 220/110 (BP  Location: Left Arm, Patient Position: Sitting, Cuff Size: Large)   Pulse 86   Temp 98.6 F (37 C) (Oral)   Resp 16   Ht 5' (1.524 m)   Wt 168 lb (76.2 kg)   SpO2 99%   BMI 32.81 kg/m   BP Readings from Last 3 Encounters:  02/04/20 (!) 220/110  11/22/19 (!) 200/140  08/01/19 (!) 180/110    Wt Readings from Last 3 Encounters:  02/04/20 168 lb (76.2 kg)  11/22/19 172 lb (78 kg)  08/01/19 166 lb (75.3  kg)    Physical Exam Vitals reviewed.  Constitutional:      Appearance: Normal appearance. She is not ill-appearing, toxic-appearing or diaphoretic.  HENT:     Nose: Nose normal.     Mouth/Throat:     Mouth: Mucous membranes are moist.  Eyes:     General: No scleral icterus.    Conjunctiva/sclera: Conjunctivae normal.  Cardiovascular:     Rate and Rhythm: Normal rate and regular rhythm.     Heart sounds: No murmur heard.      Comments: EKG -  NSR, 67 bpm No LVH Normal EKG Pulmonary:     Effort: Pulmonary effort is normal.     Breath sounds: No stridor. No wheezing, rhonchi or rales.  Abdominal:     General: Abdomen is flat. Bowel sounds are normal. There is no distension.     Palpations: There is no hepatomegaly, splenomegaly or mass.     Tenderness: There is no abdominal tenderness.     Hernia: No hernia is present.  Musculoskeletal:        General: Normal range of motion.     Cervical back: Neck supple.     Right lower leg: No edema.     Left lower leg: No edema.  Lymphadenopathy:     Cervical: No cervical adenopathy.  Skin:    General: Skin is warm and dry.  Neurological:     General: No focal deficit present.     Mental Status: She is alert and oriented to person, place, and time. Mental status is at baseline.  Psychiatric:        Mood and Affect: Mood normal.        Behavior: Behavior normal.     Lab Results  Component Value Date   WBC 10.7 (H) 02/04/2020   HGB 13.8 02/04/2020   HCT 42.0 02/04/2020   PLT 274.0 02/04/2020   GLUCOSE 118 (H) 02/04/2020   CHOL 160 02/04/2020   TRIG 297.0 (H) 02/04/2020   HDL 25.70 (L) 02/04/2020   LDLDIRECT 78.0 02/04/2020   LDLCALC 92 10/31/2017   ALT 9 02/04/2020   AST 19 02/04/2020   NA 136 02/04/2020   K 3.9 02/04/2020   CL 99 02/04/2020   CREATININE 1.04 02/04/2020   BUN 9 02/04/2020   CO2 30 02/04/2020   TSH 3.04 11/22/2019   INR 1.0 04/23/2008   HGBA1C 5.9 05/04/2017    No results found.  Assessment  & Plan:   Emily [name] was seen today for hypertension.  Diagnoses and all orders for this visit:  Essential hypertension- Her blood pressure is dangerously elevated but she is asymptomatic.  Her EKG is reassuring.  Labs are remarkable for a rising p.m. random cortisol level.  I recommended that she be admitted for an inpatient evaluation.  I have not heard back from her regarding this yet.  Will screen for other secondary causes including substance abuse.  Will increase the dose of the  clonidine patch and continue the other antihypertensives.  We discussed smoking cessation but she said she cannot afford any other remedies at this time. -     Basic metabolic panel; Future -     ACTH; Future -     Cortisol; Future -     Urine drugs of abuse scrn w alc, routine (Ref Lab); Future -     Urinalysis, Routine w reflex microscopic; Future -     cloNIDine (CATAPRES - DOSED IN MG/24 HR) 0.2 mg/24hr patch; Place 1 patch (0.2 mg total) onto the skin once a week. -     EKG 12-Lead -     CBC with Differential/Platelet; Future -     olmesartan (BENICAR) 40 MG tablet; Take 1 tablet (40 mg total) by mouth daily. -     CBC with Differential/Platelet -     Cortisol -     ACTH -     Basic metabolic panel -     Urinalysis, Routine w reflex microscopic -     Urine drugs of abuse scrn w alc, routine (Ref Lab)  High serum cortisol- Her p.m. cortisol level continues to rise.  I will monitor her ACTH level.  I have asked her to undergo a CT scan of the abdomen and pelvis with contrast to see if there is adrenal tumor. -     Basic metabolic panel; Future -     ACTH; Future -     Cortisol; Future -     Cortisol -     ACTH -     Basic metabolic panel -     CT Abdomen Pelvis W Contrast; Future  Stage 3a chronic kidney disease- Her renal function is stable but her blood pressure is not adequately well controlled.  Fatty liver- Her liver enzymes are normal now. -     Hepatic function panel; Future -     Hepatic  function panel  Hyperlipidemia with target LDL less than 70- I have asked her to take a statin for CV risk reduction. -     Lipid panel; Future -     Lipid panel  Hypertriglyceridemia- Her triglycerides remain elevated. -     Lipid panel; Future -     Lipid panel  Vitamin D deficiency -     VITAMIN D 25 Hydroxy (Vit-D Deficiency, Fractures); Future -     VITAMIN D 25 Hydroxy (Vit-D Deficiency, Fractures)  Iron deficiency anemia secondary to inadequate dietary iron intake- Her H&H are normal now. -     CBC with Differential/Platelet; Future -     CBC with Differential/Platelet  Chronic pain syndrome -     methadone (DOLOPHINE) 10 MG tablet; Take a total of 30 mg [3 tablets] TID  Cervical spinal stenosis -     methadone (DOLOPHINE) 10 MG tablet; Take a total of 30 mg [3 tablets] TID  Other orders -     LDL cholesterol, direct   I have discontinued Sylvana E. Gilbert's cloNIDine. I am also having her start on cloNIDine. Additionally, I am having her maintain her cyanocobalamin, Cholecalciferol, potassium chloride SA, omega-3 acid ethyl esters, carvedilol, indapamide, olmesartan, methadone, and atorvastatin.  Meds ordered this encounter  Medications  . cloNIDine (CATAPRES - DOSED IN MG/24 HR) 0.2 mg/24hr patch    Sig: Place 1 patch (0.2 mg total) onto the skin once a week.    Dispense:  12 patch    Refill:  0  . olmesartan (BENICAR) 40 MG tablet  Sig: Take 1 tablet (40 mg total) by mouth daily.    Dispense:  90 tablet    Refill:  1  . methadone (DOLOPHINE) 10 MG tablet    Sig: Take a total of 30 mg [3 tablets] TID    Dispense:  270 tablet    Refill:  0  . atorvastatin (LIPITOR) 10 MG tablet    Sig: Take 1 tablet (10 mg total) by mouth daily.    Dispense:  90 tablet    Refill:  1   I spent 50 minutes in preparing to see the patient by review of recent labs, imaging and procedures, obtaining and reviewing separately obtained history, communicating with the patient and  family or caregiver, ordering medications, tests or procedures, and documenting clinical information in the EHR including the differential Dx, treatment, and any further evaluation and other management of 1. Essential hypertension. 2. High serum cortisol 3. Stage 3a chronic kidney disease 4. Fatty liver 5. Hyperlipidemia with target LDL less than 70 6. Hypertriglyceridemia 7. Vitamin D deficiency 8. Iron deficiency anemia secondary to inadequate dietary iron intake 9. Chronic pain syndrome 10. Cervical spinal stenosis     Follow-up: Return in about 2 months (around 04/05/2020).  Scarlette Calico, MD

## 2020-02-04 NOTE — Patient Instructions (Signed)

## 2020-02-05 ENCOUNTER — Encounter: Payer: Self-pay | Admitting: Internal Medicine

## 2020-02-06 LAB — ACTH: C206 ACTH: 31 pg/mL (ref 6–50)

## 2020-02-06 MED ORDER — ATORVASTATIN CALCIUM 10 MG PO TABS: 10.0000 mg | ORAL_TABLET | Freq: Every day | ORAL | 1 refills | Status: DC

## 2020-02-08 LAB — URINE DRUGS OF ABUSE SCREEN W ALC, ROUTINE (REF LAB)
Amphetamines, Urine: NEGATIVE ng/mL
Barbiturate Quant, Ur: NEGATIVE ng/mL
Benzodiazepine Quant, Ur: NEGATIVE ng/mL
Cannabinoid Quant, Ur: NEGATIVE ng/mL
Cocaine (Metab.): NEGATIVE ng/mL
Ethanol, Urine: NEGATIVE %
Opiate Quant, Ur: NEGATIVE ng/mL
PCP Quant, Ur: NEGATIVE ng/mL
Propoxyphene: NEGATIVE ng/mL

## 2020-02-08 LAB — METHADONE CONFIRMATION, URINE
Methadone GC/MS Conf: >5000 ng/mL
Methadone: POSITIVE — AB

## 2020-03-03 ENCOUNTER — Encounter: Payer: Self-pay | Admitting: Internal Medicine

## 2020-03-03 ENCOUNTER — Other Ambulatory Visit: Payer: Self-pay | Admitting: Internal Medicine

## 2020-03-03 DIAGNOSIS — M4802 Spinal stenosis, cervical region: Secondary | ICD-10-CM

## 2020-03-03 DIAGNOSIS — G894 Chronic pain syndrome: Secondary | ICD-10-CM

## 2020-03-04 ENCOUNTER — Other Ambulatory Visit: Payer: Self-pay | Admitting: Internal Medicine

## 2020-03-04 ENCOUNTER — Encounter: Payer: Self-pay | Admitting: Internal Medicine

## 2020-03-04 DIAGNOSIS — G894 Chronic pain syndrome: Secondary | ICD-10-CM

## 2020-03-04 DIAGNOSIS — M4802 Spinal stenosis, cervical region: Secondary | ICD-10-CM

## 2020-03-04 MED ORDER — METHADONE HCL 10 MG PO TABS: ORAL_TABLET | ORAL | 0 refills | Status: DC

## 2020-03-05 ENCOUNTER — Ambulatory Visit: Payer: Self-pay | Admitting: Internal Medicine

## 2020-03-05 ENCOUNTER — Encounter: Payer: Self-pay | Admitting: Internal Medicine

## 2020-03-07 ENCOUNTER — Other Ambulatory Visit: Payer: Self-pay | Admitting: Internal Medicine

## 2020-03-07 DIAGNOSIS — M4802 Spinal stenosis, cervical region: Secondary | ICD-10-CM

## 2020-03-07 DIAGNOSIS — G894 Chronic pain syndrome: Secondary | ICD-10-CM

## 2020-03-07 MED ORDER — METHADONE HCL 10 MG PO TABS: ORAL_TABLET | ORAL | 0 refills | Status: DC

## 2020-04-03 ENCOUNTER — Encounter (HOSPITAL_COMMUNITY): Payer: Self-pay

## 2020-04-03 ENCOUNTER — Encounter: Payer: Self-pay | Admitting: Internal Medicine

## 2020-04-03 ENCOUNTER — Emergency Department (HOSPITAL_COMMUNITY): Payer: Self-pay

## 2020-04-03 ENCOUNTER — Emergency Department (HOSPITAL_COMMUNITY)
Admission: EM | Admit: 2020-04-03 | Discharge: 2020-04-03 | Disposition: A | Discharge status: Home / Self Care | Payer: Self-pay | Attending: Emergency Medicine | Admitting: Emergency Medicine

## 2020-04-03 ENCOUNTER — Other Ambulatory Visit: Payer: Self-pay

## 2020-04-03 ENCOUNTER — Ambulatory Visit (INDEPENDENT_AMBULATORY_CARE_PROVIDER_SITE_OTHER): Payer: Self-pay | Admitting: Internal Medicine

## 2020-04-03 VITALS — BP 240/126 | HR 77 | Temp 98.1°F | Ht 60.0 in | Wt 164.1 lb

## 2020-04-03 DIAGNOSIS — I169 Hypertensive crisis, unspecified: Secondary | ICD-10-CM | POA: Insufficient documentation

## 2020-04-03 DIAGNOSIS — F17210 Nicotine dependence, cigarettes, uncomplicated: Secondary | ICD-10-CM | POA: Insufficient documentation

## 2020-04-03 DIAGNOSIS — Z79891 Long term (current) use of opiate analgesic: Secondary | ICD-10-CM | POA: Insufficient documentation

## 2020-04-03 DIAGNOSIS — Z23 Encounter for immunization: Secondary | ICD-10-CM

## 2020-04-03 DIAGNOSIS — Z79899 Other long term (current) drug therapy: Secondary | ICD-10-CM | POA: Insufficient documentation

## 2020-04-03 DIAGNOSIS — N183 Chronic kidney disease, stage 3 unspecified: Secondary | ICD-10-CM | POA: Insufficient documentation

## 2020-04-03 DIAGNOSIS — I10 Essential (primary) hypertension: Secondary | ICD-10-CM

## 2020-04-03 DIAGNOSIS — I129 Hypertensive chronic kidney disease with stage 1 through stage 4 chronic kidney disease, or unspecified chronic kidney disease: Secondary | ICD-10-CM | POA: Insufficient documentation

## 2020-04-03 LAB — CBC WITH DIFFERENTIAL/PLATELET
Abs Immature Granulocytes: 0.1 10*3/uL — ABNORMAL HIGH (ref 0.00–0.07)
Basophils Absolute: 0.1 10*3/uL (ref 0.0–0.1)
Basophils Relative: 1 %
Eosinophils Absolute: 0.1 10*3/uL (ref 0.0–0.5)
Eosinophils Relative: 1 %
HCT: 46.6 % — ABNORMAL HIGH (ref 36.0–46.0)
Hemoglobin: 14.9 g/dL (ref 12.0–15.0)
Immature Granulocytes: 1 %
Lymphocytes Relative: 12 %
Lymphs Abs: 1.8 10*3/uL (ref 0.7–4.0)
MCH: 28 pg (ref 26.0–34.0)
MCHC: 32 g/dL (ref 30.0–36.0)
MCV: 87.4 fL (ref 80.0–100.0)
Monocytes Absolute: 0.7 10*3/uL (ref 0.1–1.0)
Monocytes Relative: 5 %
Neutro Abs: 12 10*3/uL — ABNORMAL HIGH (ref 1.7–7.7)
Neutrophils Relative %: 80 %
Platelets: 317 10*3/uL (ref 150–400)
RBC: 5.33 MIL/uL — ABNORMAL HIGH (ref 3.87–5.11)
RDW: 14.5 % (ref 11.5–15.5)
WBC: 14.8 10*3/uL — ABNORMAL HIGH (ref 4.0–10.5)
nRBC: 0 % (ref 0.0–0.2)

## 2020-04-03 LAB — COMPREHENSIVE METABOLIC PANEL
ALT: 12 U/L (ref 0–44)
AST: 21 U/L (ref 15–41)
Albumin: 4.4 g/dL (ref 3.5–5.0)
Alkaline Phosphatase: 107 U/L (ref 38–126)
Anion gap: 11 (ref 5–15)
BUN: 11 mg/dL (ref 6–20)
CO2: 25 mmol/L (ref 22–32)
Calcium: 9.4 mg/dL (ref 8.9–10.3)
Chloride: 101 mmol/L (ref 98–111)
Creatinine, Ser: 0.98 mg/dL (ref 0.44–1.00)
GFR calc Af Amer: >60 mL/min (ref >60)
GFR calc non Af Amer: >60 mL/min (ref >60)
Glucose, Bld: 96 mg/dL (ref 70–99)
Potassium: 3.6 mmol/L (ref 3.5–5.1)
Sodium: 137 mmol/L (ref 135–145)
Total Bilirubin: 0.5 mg/dL (ref 0.3–1.2)
Total Protein: 8.7 g/dL — ABNORMAL HIGH (ref 6.5–8.1)

## 2020-04-03 MED ORDER — SODIUM CHLORIDE 0.9 % IV SOLN: INTRAVENOUS | Status: DC

## 2020-04-03 MED ORDER — CARVEDILOL 25 MG PO TABS: 25.0000 mg | ORAL_TABLET | Freq: Two times a day (BID) | ORAL | 0 refills | Status: DC

## 2020-04-03 MED ORDER — LABETALOL HCL 5 MG/ML IV SOLN
20.0000 mg | Freq: Once | INTRAVENOUS | Status: AC
  Administered 2020-04-03: 20 mg via INTRAVENOUS
  Filled 2020-04-03: qty 4

## 2020-04-03 NOTE — ED Provider Notes (Signed)
[facility] DEPT Provider Note   CSN: [id number] Arrival date & time: 04/03/20  1344     History Chief Complaint  Patient presents with  . Hypertension    Emily [name] is a 54 y.o. female.  HPI She presents after being evaluated by her PCP at the office for blood pressure.  It was found to be mildly elevated and he was concerned about hypertensive urgency so sent her here for evaluation.  She is taking her usual medications, as prescribed.  She was changed to a higher dose of Catapres patch, several weeks ago.  She denies headache, blurred vision, nausea, vomiting, chest pain, shortness of breath, weakness, paresthesias, dizziness, difficulty walking.  She has had Covid vaccines completed several months ago.  There are no other known modifying factors.    Past Medical History:  Diagnosis Date  . Allergic rhinitis, cause unspecified   . Chronic pain syndrome   . Fibromyalgia   . Goiter, unspecified   . Lumbago   . Spinal stenosis, unspecified region other than cervical   . Unspecified essential hypertension   . Urinary tract infection, site not specified     Patient Active Problem List   Diagnosis Date Noted  . Hypertensive crisis 04/03/2020  . High serum cortisol 11/24/2019  . Skin lesion of foot 11/22/2019  . Drug-induced hypokalemia 11/23/2018  . Long-term current use of opiate analgesic 01/24/2018  . Encounter for long-term opiate analgesic use 05/04/2017  . Chronic renal disease, stage 3, moderately decreased glomerular filtration rate (GFR) between 30-59 mL/min/1.73 square meter 05/04/2017  . LAE (left atrial enlargement) 05/20/2016  . Hyperlipidemia with target LDL less than 70 01/02/2016  . Hypertriglyceridemia 12/23/2014  . Visit for screening mammogram 12/23/2014  . Vitamin D deficiency 12/18/2013  . Fatty liver 12/17/2013  . Cervical spinal stenosis 12/17/2013  . Folate deficiency anemia 12/17/2013  . Anemia, iron  deficiency 12/17/2013  . Routine general medical examination at a health care facility 01/21/2011  . DEPRESSION/ANXIETY 10/26/2009  . Chronic pain syndrome 07/25/2007  . Essential hypertension 07/25/2007  . ALLERGIC RHINITIS 07/25/2007  . FIBROMYALGIA 07/25/2007    Past Surgical History:  Procedure Laterality Date  . APPENDECTOMY  9-09   Laproscopic- Dr Marlou Starks  . CESAREAN SECTION     X 2  . CRANIECTOMY    . LAMINECTOMY    . URETHRAL DILATION  54 yrs old     OB History   No obstetric history on file.     Family History  Problem Relation Age of Onset  . Hyperlipidemia Father   . Hypertension Father   . Coronary artery disease Father   . Heart attack Father   . Hyperlipidemia Mother   . Hypertension Mother   . Cancer Neg Hx        colon, breast  . Diabetes Neg Hx     Social History   Tobacco Use  . Smoking status: Current Every Day Smoker  . Smokeless tobacco: Never Used  Substance Use Topics  . Alcohol use: No  . Drug use: No    Home Medications Prior to Admission medications   Medication Sig Start Date End Date Taking? Authorizing Provider  atorvastatin (LIPITOR) 10 MG tablet Take 1 tablet (10 mg total) by mouth daily. 02/06/20  Yes Janith Lima, MD  cloNIDine (CATAPRES - DOSED IN MG/24 HR) 0.2 mg/24hr patch Place 1 patch (0.2 mg total) onto the skin once a week. 02/04/20  Yes Janith Lima, MD  cyanocobalamin 2000 MCG tablet Take 1 tablet (2,000 mcg total) by mouth daily. 11/01/17  Yes Janith Lima, MD  methadone (DOLOPHINE) 10 MG tablet Take a total of 30 mg [3 tablets] TID 03/07/20  Yes Janith Lima, MD  olmesartan (BENICAR) 40 MG tablet Take 1 tablet (40 mg total) by mouth daily. 02/04/20  Yes Janith Lima, MD  Omega-3 Fatty Acids (FISH OIL) 1000 MG CAPS Take 2 capsules by mouth every evening.   Yes [provider]  potassium chloride SA (K-DUR,KLOR-CON) 20 MEQ tablet Take 1 tablet (20 mEq total) by mouth 3 (three) times daily. 11/23/18  Yes  Janith Lima, MD  Vitamin D, Ergocalciferol, (DRISDOL) 1.25 MG (50000 UNIT) CAPS capsule Take 50,000 Units by mouth every 7 (seven) days.   Yes [provider]  carvedilol (COREG) 25 MG tablet Take 1 tablet (25 mg total) by mouth 2 (two) times daily with a meal. 04/03/20   Daleen Bo, MD  Cholecalciferol 2000 units TABS Take 1 tablet (2,000 Units total) by mouth daily. Patient not taking: Reported on 04/03/2020 11/01/17   Janith Lima, MD  indapamide (LOZOL) 2.5 MG tablet Take 1 tablet (2.5 mg total) by mouth daily. Patient not taking: Reported on 04/03/2020 01/18/20   Janith Lima, MD  omega-3 acid ethyl esters (LOVAZA) 1 g capsule Take 2 capsules (2 g total) by mouth 2 (two) times daily. Patient not taking: Reported on 04/03/2020 03/21/19   Janith Lima, MD    Allergies    Patient has no known allergies.  Review of Systems   Review of Systems  All other systems reviewed and are negative.   Physical Exam Updated Vital Signs BP (!) 211/122   Pulse 81   Temp 98.4 F (36.9 C) (Oral)   Resp 17   SpO2 95%   Physical Exam Vitals and nursing note reviewed.  Constitutional:      General: She is not in acute distress.    Appearance: She is well-developed. She is not ill-appearing, toxic-appearing or diaphoretic.  HENT:     Head: Normocephalic and atraumatic.     Right Ear: External ear normal.     Left Ear: External ear normal.  Eyes:     Conjunctiva/sclera: Conjunctivae normal.     Pupils: Pupils are equal, round, and reactive to light.  Neck:     Trachea: Phonation normal.  Cardiovascular:     Rate and Rhythm: Normal rate and regular rhythm.     Heart sounds: Normal heart sounds.  Pulmonary:     Effort: Pulmonary effort is normal.     Breath sounds: Normal breath sounds.  Abdominal:     General: There is no distension.  Musculoskeletal:        General: Normal range of motion.     Cervical back: Normal range of motion and neck supple.  Skin:     General: Skin is warm and dry.  Neurological:     Mental Status: She is alert and oriented to person, place, and time.     Cranial Nerves: No cranial nerve deficit.     Sensory: No sensory deficit.     Motor: No abnormal muscle tone.     Coordination: Coordination normal.     Comments: No dysarthria or aphasia.  No ataxia.  Psychiatric:        Mood and Affect: Mood normal.        Behavior: Behavior normal.        Thought  Content: Thought content normal.        Judgment: Judgment normal.     ED Results / Procedures / Treatments   Labs (all labs ordered are listed, but only abnormal results are displayed) Labs Reviewed  COMPREHENSIVE METABOLIC PANEL - Abnormal; Notable for the following components:      Result Value   Total Protein 8.7 (*)    All other components within normal limits  CBC WITH DIFFERENTIAL/PLATELET - Abnormal; Notable for the following components:   WBC 14.8 (*)    RBC 5.33 (*)    HCT 46.6 (*)    Neutro Abs 12.0 (*)    Abs Immature Granulocytes 0.10 (*)    All other components within normal limits    EKG None   Date: 04/03/20  Rate: 26  Rhythm: normal sinus rhythm  QRS Axis: normal  PR and QT Intervals: normal  ST/T Wave abnormalities: nonspecific T wave changes  PR and QRS Conduction Disutrbances:none  Narrative Interpretation:   Old EKG Reviewed: changes noted   Radiology DG Chest Port 1 View  Result Date: 04/03/2020 CLINICAL DATA:  Hypertension EXAM: PORTABLE CHEST 1 VIEW COMPARISON:  08/01/2019 FINDINGS: The heart size and mediastinal contours are within normal limits. Both lungs are clear. The visualized skeletal structures are unremarkable. IMPRESSION: Normal chest Electronically Signed   By: Rolm Baptise M.D.   On: 04/03/2020 17:03    Procedures .Critical Care Performed by: Daleen Bo, MD Authorized by: Daleen Bo, MD   Critical care provider statement:    Critical care time (minutes):  35   Critical care start time:  04/03/2020  4:10 PM   Critical care end time:  04/03/2020 9:51 PM   Critical care time was exclusive of:  Separately billable procedures and treating other patients   Critical care was necessary to treat or prevent imminent or life-threatening deterioration of the following conditions:  Circulatory failure   Critical care was time spent personally by me on the following activities:  Blood draw for specimens, development of treatment plan with patient or surrogate, discussions with consultants, evaluation of patient's response to treatment, examination of patient, obtaining history from patient or surrogate, ordering and performing treatments and interventions, ordering and review of laboratory studies, pulse oximetry, re-evaluation of patient's condition, review of old charts and ordering and review of radiographic studies   (including critical care time)  Medications Ordered in ED Medications  0.9 %  sodium chloride infusion ( Intravenous Not Given 04/03/20 2122)  labetalol (NORMODYNE) injection 20 mg (20 mg Intravenous Given 04/03/20 1833)    ED Course  I have reviewed the triage vital signs and the nursing notes.  Pertinent labs & imaging results that were available during my care of the patient were reviewed by me and considered in my medical decision making (see chart for details).  Clinical Course as of Apr 03 2154  Thu Apr 03, 2020  1804 Normal except total protein high  Comprehensive metabolic panel(!) [EW]  7371 Normal except white count high, hematocrit high, neutrophils elevated  CBC with Differential(!) [EW]  1805 No infiltrate or CHF, interpreted by me  DG Chest [facility] 1 View [EW]    Clinical Course User Index [EW] Daleen Bo, MD   MDM Rules/Calculators/A&P                           Patient Vitals for the past 24 hrs:  BP Temp Temp src Pulse Resp SpO2  04/03/20  2147 (!) 211/122 -- -- 81 17 95 %  04/03/20 2000 127/88 -- -- 61 11 91 %  04/03/20 1900 (!) 176/110 -- -- (!) 59 10 91  %  04/03/20 1827 (!) 190/109 -- -- 73 12 93 %  04/03/20 1745 (!) 192/111 -- -- 72 11 94 %  04/03/20 1700 (!) 214/113 -- -- 74 12 93 %  04/03/20 1412 (!) 222/113 98.4 F (36.9 C) Oral 71 16 94 %    9:47 PM Reevaluation with update and discussion. After initial assessment and treatment, an updated evaluation reveals she is calm and comfortable.  At this time blood pressure reading 200/115, taken about 25 minutes ago.  Patient states at that time she was feeling very nervous and anxious because her dog is at home alone.  She denies headache, chest pain or trouble breathing at this time.  The patient is anxious to go home.  She is agreeable to taking an increased dose of Coreg, until she can follow-up with her PCP.  Findings discussed and questions answered. Daleen Bo   Medical Decision Making:  This patient is presenting for evaluation of high blood pressure, which does require a range of treatment options, and is a complaint that involves a high risk of morbidity and mortality. The differential diagnoses include elevated blood pressure, hypertensive urgency, metabolic instability. I decided to review old records, and in summary middle-aged female presenting for evaluation of blood pressure higher than expected when she saw her PCP today in follow-up for high blood pressure.  She was sent here for further evaluation..  I did not require additional historical information from anyone.  Clinical Laboratory Tests Ordered, included CBC and Metabolic panel. Review indicates normal except elevated total protein, elevated white count, elevated RBC, elevated neutrophils.. Radiologic Tests Ordered, included chest x-ray.  I independently Visualized: Radiographic images, which show no infiltrate or CHF  Cardiac Monitor Tracing which shows normal sinus rhythm  Critical Interventions-clinical evaluation, laboratory testing, observation reassessment.  After These Interventions, the Patient was reevaluated  and was found elevated blood pressure from baseline, without distinct acute clinical symptoms.  Screening evaluation indicates normal chest x-ray, reassuring blood work, and slightly changed EKG from baseline.  EKG today shows flattening T waves in V2 and V3, different from prior tracing.  She is not having chest pain.  She is currently taking triple therapy for blood pressure treatment, with carvedilol, clonidine, and olmesartan.  She has previously been tried on indapamide, but did not tolerate it.  Patient was treated with IV labetalol, x1 dose, and 1833.  At 2000 hrs. her blood pressure was normal 127/88.  It is unlikely that the labetalol, given IV is a single dose was affecting her blood pressure at 2000 hrs.  Patient states that she was anxious when her blood pressure was checked at 2121, when her blood pressure was again high.  Patient does have labile blood pressure, possibly associated with anxiousness/whitecoat syndrome.  At this time I did not see any signs or symptoms of hypertensive urgency, and the patient is stable for discharge.  Plan to increase patient's Coreg, to 25 mg twice a day, until she can follow-up with her PCP.  She is instructed to watch for signs and symptoms of excessive beta-blocker therapy including weakness, dizziness, near syncope or syncope.  CRITICAL CARE-yes Performed by: Daleen Bo  Nursing Notes Reviewed/ Care Coordinated Applicable Imaging Reviewed Interpretation of Laboratory Data incorporated into ED treatment  The patient appears reasonably screened and/or stabilized for discharge  and I doubt any other medical condition or other [facility] requiring further screening, evaluation, or treatment in the ED at this time prior to discharge.  Plan: Home Medications-continue usual medicines and increase Coreg to 25 mg twice a day; Home Treatments-DASH diet; return here if the recommended treatment, does not improve the symptoms; Recommended follow up-PCP check her blood  pressure in 2 or 3 days and ongoing management as needed.     Final Clinical Impression(s) / ED Diagnoses Final diagnoses:  Hypertension, unspecified type    Rx / DC Orders ED Discharge Orders         Ordered    carvedilol (COREG) 25 MG tablet  2 times daily with meals        04/03/20 2153           Daleen Bo, MD 04/03/20 2155

## 2020-04-03 NOTE — ED Triage Notes (Signed)
Pt presents with c/o hypertension. Pt does have a hx of same, is on medication and is working with her MD to get the dose adjusted. Pt was sent here by her PCP because her BP was so elevated. Pt is asymptomatic at this time.

## 2020-04-03 NOTE — Patient Instructions (Signed)

## 2020-04-03 NOTE — Addendum Note (Signed)
Addended by: Karle Barr on: 04/03/2020 03:57 PM   Modules accepted: Orders

## 2020-04-03 NOTE — Discharge Instructions (Signed)
We are increasing your Coreg to 25 mg twice a day to improve your blood pressure treatment.  Watch for signs and symptoms of complications of this increase, including weakness, dizziness, feeling faint, or slow heartbeat.  Also watch for signs of hypertension including headache, chest pain, and blurred vision.  Follow-up for repeat blood pressure check by a medical provider in 2 or 3 days.

## 2020-04-03 NOTE — Progress Notes (Signed)
Subjective:  Patient ID: Emily [name], female    DOB: Dec 20, 1965  Age: 54 y.o. MRN: [id number]  CC: Hypertension  This visit occurred during the SARS-CoV-2 public health emergency.  Safety protocols were in place, including screening questions prior to the visit, additional usage of staff PPE, and extensive cleaning of exam room while observing appropriate contact time as indicated for disinfecting solutions.    HPI Emily [name] presents for f/up - She tells me she is taking her antihypertensives.  She monitors her blood pressure at home and says it is very well controlled but never less than 140/90.  She has developed a cough over the last week.  When I last saw her I was concerned that she might have an adrenal tumor so I asked her to undergo a CT scan of the abdomen.  She has not had this done yet.  She complains of chronic neck pain that is well controlled with methadone.  Outpatient Medications Prior to Visit  Medication Sig Dispense Refill  . atorvastatin (LIPITOR) 10 MG tablet Take 1 tablet (10 mg total) by mouth daily. 90 tablet 1  . carvedilol (COREG) 12.5 MG tablet Take 1 tablet (12.5 mg total) by mouth 2 (two) times daily with a meal. 180 tablet 1  . Cholecalciferol 2000 units TABS Take 1 tablet (2,000 Units total) by mouth daily. 90 tablet 1  . cloNIDine (CATAPRES - DOSED IN MG/24 HR) 0.2 mg/24hr patch Place 1 patch (0.2 mg total) onto the skin once a week. 12 patch 0  . cyanocobalamin 2000 MCG tablet Take 1 tablet (2,000 mcg total) by mouth daily. 90 tablet 1  . indapamide (LOZOL) 2.5 MG tablet Take 1 tablet (2.5 mg total) by mouth daily. 90 tablet 1  . methadone (DOLOPHINE) 10 MG tablet Take a total of 30 mg [3 tablets] TID 270 tablet 0  . olmesartan (BENICAR) 40 MG tablet Take 1 tablet (40 mg total) by mouth daily. 90 tablet 1  . omega-3 fish oil (MAXEPA) 1000 MG CAPS capsule Take 2 capsules by mouth.    . potassium chloride SA (K-DUR,KLOR-CON) 20 MEQ tablet Take 1  tablet (20 mEq total) by mouth 3 (three) times daily. 270 tablet 1  . omega-3 acid ethyl esters (LOVAZA) 1 g capsule Take 2 capsules (2 g total) by mouth 2 (two) times daily. (Patient not taking: Reported on 04/03/2020) 360 capsule 1   No facility-administered medications prior to visit.    ROS Review of Systems  Constitutional: Negative.  Negative for appetite change, diaphoresis, fatigue and unexpected weight change.  HENT: Negative.   Eyes: Negative for visual disturbance.  Respiratory: Positive for cough.   Cardiovascular: Negative for chest pain, palpitations and leg swelling.  Gastrointestinal: Negative for abdominal pain, constipation, diarrhea, nausea and vomiting.  Endocrine: Negative.   Genitourinary: Negative for difficulty urinating.  Musculoskeletal: Positive for neck pain. Negative for back pain.  Skin: Negative.   Neurological: Negative for dizziness, weakness and light-headedness.  Hematological: Negative for adenopathy. Does not bruise/bleed easily.  Psychiatric/Behavioral: Negative.     Objective:  BP (!) 240/126 (BP Location: Left Arm, Patient Position: Sitting, Cuff Size: Large)   Pulse 77   Temp 98.1 F (36.7 C) (Oral)   Ht 5' (1.524 m)   Wt 164 lb 2 oz (74.4 kg)   SpO2 93%   BMI 32.05 kg/m   BP Readings from Last 3 Encounters:  04/03/20 (!) 240/126  02/04/20 (!) 220/110  11/22/19 (!) 200/140  Wt Readings from Last 3 Encounters:  04/03/20 164 lb 2 oz (74.4 kg)  02/04/20 168 lb (76.2 kg)  11/22/19 172 lb (78 kg)    Physical Exam Vitals reviewed.  Constitutional:      General: She is not in acute distress.    Appearance: Normal appearance. She is not toxic-appearing or diaphoretic.  HENT:     Nose: Nose normal.     Mouth/Throat:     Mouth: Mucous membranes are moist.  Eyes:     General: No scleral icterus.    Conjunctiva/sclera: Conjunctivae normal.  Cardiovascular:     Rate and Rhythm: Normal rate and regular rhythm.     Heart sounds:  No murmur heard.   Pulmonary:     Effort: Pulmonary effort is normal.     Breath sounds: No stridor. No wheezing, rhonchi or rales.  Abdominal:     General: Abdomen is flat.     Palpations: There is no mass.     Tenderness: There is no abdominal tenderness. There is no guarding.  Musculoskeletal:     Cervical back: No tenderness.  Skin:    Findings: Lesion present. No abscess.     Comments: There are scabbed, excoriated lesions on her upper extremities.  Some of these have healed with depigmentation.  Neurological:     General: No focal deficit present.     Mental Status: She is alert and oriented to person, place, and time. Mental status is at baseline.     Lab Results  Component Value Date   WBC 10.7 (H) 02/04/2020   HGB 13.8 02/04/2020   HCT 42.0 02/04/2020   PLT 274.0 02/04/2020   GLUCOSE 118 (H) 02/04/2020   CHOL 160 02/04/2020   TRIG 297.0 (H) 02/04/2020   HDL 25.70 (L) 02/04/2020   LDLDIRECT 78.0 02/04/2020   LDLCALC 92 10/31/2017   ALT 9 02/04/2020   AST 19 02/04/2020   NA 136 02/04/2020   K 3.9 02/04/2020   CL 99 02/04/2020   CREATININE 1.04 02/04/2020   BUN 9 02/04/2020   CO2 30 02/04/2020   TSH 3.04 11/22/2019   INR 1.0 04/23/2008   HGBA1C 5.9 05/04/2017    No results found.  Assessment & Plan:   Emily [name] was seen today for hypertension.  Diagnoses and all orders for this visit:  Hypertensive crisis- Her systolic blood pressure is 240 mmHG.  She tells me she is asymptomatic but I am concerned she is being stoic.  I recommend that she see be seen in the emergency department and possibly admitted for hypertensive crisis.   I am having Emily [name] maintain her cyanocobalamin, Cholecalciferol, potassium chloride SA, omega-3 acid ethyl esters, carvedilol, indapamide, cloNIDine, olmesartan, atorvastatin, methadone, and omega-3 fish oil.  No orders of the defined types were placed in this encounter.    Follow-up: No follow-ups on  file.  Emily Calico, MD

## 2020-04-04 ENCOUNTER — Encounter: Payer: Self-pay | Admitting: Internal Medicine

## 2020-04-04 ENCOUNTER — Other Ambulatory Visit: Payer: Self-pay | Admitting: Internal Medicine

## 2020-04-04 DIAGNOSIS — G894 Chronic pain syndrome: Secondary | ICD-10-CM

## 2020-04-04 DIAGNOSIS — M4802 Spinal stenosis, cervical region: Secondary | ICD-10-CM

## 2020-04-04 MED ORDER — METHADONE HCL 10 MG PO TABS: 20.0000 mg | ORAL_TABLET | Freq: Three times a day (TID) | ORAL | 0 refills | Status: DC | PRN

## 2020-04-05 ENCOUNTER — Encounter: Payer: Self-pay | Admitting: Internal Medicine

## 2020-05-01 ENCOUNTER — Encounter: Payer: Self-pay | Admitting: Internal Medicine

## 2020-05-01 ENCOUNTER — Other Ambulatory Visit: Payer: Self-pay | Admitting: Internal Medicine

## 2020-05-01 DIAGNOSIS — M4802 Spinal stenosis, cervical region: Secondary | ICD-10-CM

## 2020-05-01 DIAGNOSIS — G894 Chronic pain syndrome: Secondary | ICD-10-CM

## 2020-05-01 MED ORDER — METHADONE HCL 10 MG PO TABS: 20.0000 mg | ORAL_TABLET | Freq: Three times a day (TID) | ORAL | 0 refills | Status: DC | PRN

## 2020-05-15 ENCOUNTER — Ambulatory Visit: Payer: Medicaid Other | Admitting: Internal Medicine

## 2020-05-29 ENCOUNTER — Ambulatory Visit: Payer: Self-pay | Admitting: Internal Medicine

## 2020-06-02 ENCOUNTER — Ambulatory Visit: Payer: Medicaid Other | Admitting: Internal Medicine

## 2020-06-02 ENCOUNTER — Other Ambulatory Visit: Payer: Self-pay | Admitting: Internal Medicine

## 2020-06-02 DIAGNOSIS — I10 Essential (primary) hypertension: Secondary | ICD-10-CM

## 2020-06-02 MED ORDER — CARVEDILOL 25 MG PO TABS: 25.0000 mg | ORAL_TABLET | Freq: Two times a day (BID) | ORAL | 0 refills | Status: DC

## 2020-06-03 ENCOUNTER — Encounter: Payer: Self-pay | Admitting: Internal Medicine

## 2020-06-03 ENCOUNTER — Ambulatory Visit (INDEPENDENT_AMBULATORY_CARE_PROVIDER_SITE_OTHER): Payer: Self-pay | Admitting: Internal Medicine

## 2020-06-03 ENCOUNTER — Other Ambulatory Visit: Payer: Self-pay

## 2020-06-03 VITALS — BP 204/120 | HR 77 | Temp 98.5°F | Ht 60.0 in | Wt 160.0 lb

## 2020-06-03 DIAGNOSIS — M4802 Spinal stenosis, cervical region: Secondary | ICD-10-CM

## 2020-06-03 DIAGNOSIS — N1831 Chronic kidney disease, stage 3a: Secondary | ICD-10-CM

## 2020-06-03 DIAGNOSIS — G894 Chronic pain syndrome: Secondary | ICD-10-CM

## 2020-06-03 DIAGNOSIS — J449 Chronic obstructive pulmonary disease, unspecified: Secondary | ICD-10-CM

## 2020-06-03 DIAGNOSIS — J4489 Other specified chronic obstructive pulmonary disease: Secondary | ICD-10-CM

## 2020-06-03 DIAGNOSIS — I10 Essential (primary) hypertension: Secondary | ICD-10-CM

## 2020-06-03 DIAGNOSIS — E781 Pure hyperglyceridemia: Secondary | ICD-10-CM

## 2020-06-03 DIAGNOSIS — R7989 Other specified abnormal findings of blood chemistry: Secondary | ICD-10-CM

## 2020-06-03 LAB — BASIC METABOLIC PANEL
BUN: 9 mg/dL (ref 6–23)
CO2: 29 mEq/L (ref 19–32)
Calcium: 9.9 mg/dL (ref 8.4–10.5)
Chloride: 99 mEq/L (ref 96–112)
Creatinine, Ser: 1.03 mg/dL (ref 0.40–1.20)
GFR: 61.2 mL/min (ref >60.00)
Glucose, Bld: 121 mg/dL — ABNORMAL HIGH (ref 70–99)
Potassium: 4 mEq/L (ref 3.5–5.1)
Sodium: 136 mEq/L (ref 135–145)

## 2020-06-03 LAB — TRIGLYCERIDES: Triglycerides: 316 mg/dL — ABNORMAL HIGH (ref 0.0–149.0)

## 2020-06-03 MED ORDER — CLONIDINE 0.2 MG/24HR TD PTWK: 0.2000 mg | MEDICATED_PATCH | TRANSDERMAL | 0 refills | Status: DC

## 2020-06-03 MED ORDER — METHADONE HCL 10 MG PO TABS: 20.0000 mg | ORAL_TABLET | Freq: Three times a day (TID) | ORAL | 0 refills | Status: DC | PRN

## 2020-06-03 MED ORDER — BREZTRI AEROSPHERE 160-9-4.8 MCG/ACT IN AERO: 2.0000 | INHALATION_SPRAY | Freq: Two times a day (BID) | RESPIRATORY_TRACT | 0 refills | Status: DC

## 2020-06-03 NOTE — Progress Notes (Signed)
Subjective:  Patient ID: Emily [name], female    DOB: [birth year]  Age: 54 y.o. MRN: [id number]  CC: COPD, Cough, and Hypertension  This visit occurred during the SARS-CoV-2 public health emergency.  Safety protocols were in place, including screening questions prior to the visit, additional usage of staff PPE, and extensive cleaning of exam room while observing appropriate contact time as indicated for disinfecting solutions.    HPI CARIAH SALATINO presents for f/up -  1.  She continues to complain of pain and requests a refill on methadone.  She has a history of spinal stenosis.  She is not currently insured so she is not willing to do any x-rays or to consider surgery.  2.  According to prescription refills she would no longer be compliant with the clonidine patch.  She does not monitor her blood pressure.  She denies headache, blurred vision, chest pain, edema, or dyspnea on exertion.  3.  She continues to complain of chronic cough that is rarely productive of thin yellow phlegm.  She has had the cough for more than 2 to 3 months.  Her chest x-ray 2 months ago was unremarkable.  She continues to smoke cigarettes.  She is not currently using any inhalers.  She complains of shortness of breath and wheezing.  She denies chest pain or hemoptysis.  Outpatient Medications Prior to Visit  Medication Sig Dispense Refill  . atorvastatin (LIPITOR) 10 MG tablet Take 1 tablet (10 mg total) by mouth daily. 90 tablet 1  . carvedilol (COREG) 25 MG tablet Take 1 tablet (25 mg total) by mouth 2 (two) times daily with a meal. 180 tablet 0  . Cholecalciferol 2000 units TABS Take 1 tablet (2,000 Units total) by mouth daily. 90 tablet 1  . cyanocobalamin 2000 MCG tablet Take 1 tablet (2,000 mcg total) by mouth daily. 90 tablet 1  . indapamide (LOZOL) 2.5 MG tablet Take 1 tablet (2.5 mg total) by mouth daily. 90 tablet 1  . olmesartan (BENICAR) 40 MG tablet Take 1 tablet (40 mg total) by mouth daily.  90 tablet 1  . Omega-3 Fatty Acids (FISH OIL) 1000 MG CAPS Take 2 capsules by mouth every evening.    . potassium chloride SA (K-DUR,KLOR-CON) 20 MEQ tablet Take 1 tablet (20 mEq total) by mouth 3 (three) times daily. 270 tablet 1  . Vitamin D, Ergocalciferol, (DRISDOL) 1.25 MG (50000 UNIT) CAPS capsule Take 50,000 Units by mouth every 7 (seven) days.    . cloNIDine (CATAPRES - DOSED IN MG/24 HR) 0.2 mg/24hr patch Place 1 patch (0.2 mg total) onto the skin once a week. 12 patch 0  . methadone (DOLOPHINE) 10 MG tablet Take 2 tablets (20 mg total) by mouth every 8 (eight) hours as needed. 180 tablet 0  . omega-3 acid ethyl esters (LOVAZA) 1 g capsule Take 2 capsules (2 g total) by mouth 2 (two) times daily. 360 capsule 1   No facility-administered medications prior to visit.    ROS Review of Systems  Constitutional: Negative for appetite change, chills, diaphoresis, fatigue and fever.  HENT: Negative.  Negative for sore throat and trouble swallowing.   Eyes: Negative.   Respiratory: Positive for cough, shortness of breath and wheezing. Negative for chest tightness and stridor.   Cardiovascular: Negative for chest pain, palpitations and leg swelling.  Gastrointestinal: Negative for abdominal pain, constipation, diarrhea, nausea and vomiting.  Genitourinary: Negative.  Negative for difficulty urinating and hematuria.  Musculoskeletal: Positive for neck pain. Negative  for arthralgias, back pain and myalgias.  Skin: Negative.   Neurological: Negative.  Negative for dizziness, weakness, light-headedness, numbness and headaches.  Hematological: Negative for adenopathy. Does not bruise/bleed easily.  Psychiatric/Behavioral: Negative.  Negative for decreased concentration, dysphoric mood and sleep disturbance. The patient is not nervous/anxious.     Objective:  BP (!) 204/120   Pulse 77   Temp 98.5 F (36.9 C) (Oral)   Ht 5' (1.524 m)   Wt 160 lb (72.6 kg)   SpO2 98%   BMI 31.25 kg/m   BP  Readings from Last 3 Encounters:  06/03/20 (!) 204/120  04/03/20 (!) 211/122  04/03/20 (!) 240/126    Wt Readings from Last 3 Encounters:  06/03/20 160 lb (72.6 kg)  04/03/20 164 lb 2 oz (74.4 kg)  02/04/20 168 lb (76.2 kg)    Physical Exam Vitals reviewed.  Constitutional:      Appearance: Normal appearance.  HENT:     Nose: Nose normal.     Mouth/Throat:     Mouth: Mucous membranes are moist.  Eyes:     General: No scleral icterus.    Conjunctiva/sclera: Conjunctivae normal.  Cardiovascular:     Rate and Rhythm: Normal rate and regular rhythm.  Pulmonary:     Effort: Pulmonary effort is normal. No accessory muscle usage or respiratory distress.     Breath sounds: No stridor. Examination of the left-upper field reveals rhonchi. Rhonchi present. No decreased breath sounds, wheezing or rales.  Abdominal:     General: Abdomen is flat.     Palpations: There is no mass.     Tenderness: There is no abdominal tenderness. There is no guarding.  Musculoskeletal:        General: Normal range of motion.     Cervical back: Neck supple.     Right lower leg: No edema.     Left lower leg: No edema.  Lymphadenopathy:     Cervical: No cervical adenopathy.  Skin:    General: Skin is warm and dry.     Coloration: Skin is not pale.  Neurological:     General: No focal deficit present.     Mental Status: She is alert and oriented to person, place, and time. Mental status is at baseline.  Psychiatric:        Mood and Affect: Mood normal.        Behavior: Behavior normal.        Thought Content: Thought content normal.        Judgment: Judgment normal.     Lab Results  Component Value Date   WBC 14.8 (H) 04/03/2020   HGB 14.9 04/03/2020   HCT 46.6 (H) 04/03/2020   PLT 317 04/03/2020   GLUCOSE 121 (H) 06/03/2020   CHOL 160 02/04/2020   TRIG 316.0 (H) 06/03/2020   HDL 25.70 (L) 02/04/2020   LDLDIRECT 78.0 02/04/2020   LDLCALC 92 10/31/2017   ALT 12 04/03/2020   AST 21  04/03/2020   NA 136 06/03/2020   K 4.0 06/03/2020   CL 99 06/03/2020   CREATININE 1.03 06/03/2020   BUN 9 06/03/2020   CO2 29 06/03/2020   TSH 3.04 11/22/2019   INR 1.0 04/23/2008   HGBA1C 5.9 05/04/2017    DG Chest Port 1 View  Result Date: 04/03/2020 CLINICAL DATA:  Hypertension EXAM: PORTABLE CHEST 1 VIEW COMPARISON:  08/01/2019 FINDINGS: The heart size and mediastinal contours are within normal limits. Both lungs are clear. The visualized skeletal structures are  unremarkable. IMPRESSION: Normal chest Electronically Signed   By: Rolm Baptise M.D.   On: 04/03/2020 17:03    Assessment & Plan:   Kalysta was seen today for copd, cough and hypertension.  Diagnoses and all orders for this visit:  High serum cortisol- I have again asked her to undergo a CT scan of the abdomen and pelvis with contrast to see if she has an adrenal tumor. -     CT Abdomen Pelvis W Contrast; Future  Chronic pain syndrome -     methadone (DOLOPHINE) 10 MG tablet; Take 2 tablets (20 mg total) by mouth every 8 (eight) hours as needed.  Cervical spinal stenosis -     methadone (DOLOPHINE) 10 MG tablet; Take 2 tablets (20 mg total) by mouth every 8 (eight) hours as needed.  Essential hypertension- Her blood pressure remains dangerously elevated.  This is most consistent with noncompliance.  She agrees to restart her 3 antihypertensives. -     cloNIDine (CATAPRES - DOSED IN MG/24 HR) 0.2 mg/24hr patch; Place 1 patch (0.2 mg total) onto the skin once a week. -     Basic metabolic panel; Future -     Basic metabolic panel  Stage 3a chronic kidney disease ([place])- Her renal function is stable.  I encouraged her to try to get better control of her blood pressure and to quit smoking.  Hypertriglyceridemia- Her triglycerides remain mildly elevated but not high enough to warrant medical treatment. -     Triglycerides; Future -     Triglycerides  COPD (chronic obstructive pulmonary disease) with chronic  bronchitis ([place])- I encouraged her to quit smoking and to start using a triple combination inhaler with a LABA/LAMA/ICS. -     Budeson-Glycopyrrol-Formoterol (BREZTRI AEROSPHERE) 160-9-4.8 MCG/ACT AERO; Inhale 2 puffs into the lungs in the morning and at bedtime.   I have discontinued [employer] omega-3 acid ethyl esters. I am also having her start on [employer]. Additionally, I am having her maintain her cyanocobalamin, Cholecalciferol, potassium chloride SA, indapamide, olmesartan, atorvastatin, Vitamin D (Ergocalciferol), Fish Oil, carvedilol, methadone, and cloNIDine.  Meds ordered this encounter  Medications  . methadone (DOLOPHINE) 10 MG tablet    Sig: Take 2 tablets (20 mg total) by mouth every 8 (eight) hours as needed.    Dispense:  180 tablet    Refill:  0  . cloNIDine (CATAPRES - DOSED IN MG/24 HR) 0.2 mg/24hr patch    Sig: Place 1 patch (0.2 mg total) onto the skin once a week.    Dispense:  12 patch    Refill:  0  . Budeson-Glycopyrrol-Formoterol (BREZTRI AEROSPHERE) 160-9-4.8 MCG/ACT AERO    Sig: Inhale 2 puffs into the lungs in the morning and at bedtime.    Dispense:  32.1 g    Refill:  0     Follow-up: Return in about 3 months (around 09/03/2020).  Scarlette Calico, MD

## 2020-06-03 NOTE — Patient Instructions (Signed)

## 2020-06-23 ENCOUNTER — Encounter: Payer: Self-pay | Admitting: Internal Medicine

## 2020-06-30 ENCOUNTER — Other Ambulatory Visit: Payer: Self-pay | Admitting: Internal Medicine

## 2020-06-30 DIAGNOSIS — M4802 Spinal stenosis, cervical region: Secondary | ICD-10-CM

## 2020-06-30 DIAGNOSIS — G894 Chronic pain syndrome: Secondary | ICD-10-CM

## 2020-06-30 MED ORDER — METHADONE HCL 10 MG PO TABS: 20.0000 mg | ORAL_TABLET | Freq: Three times a day (TID) | ORAL | 0 refills | Status: DC | PRN

## 2020-07-28 ENCOUNTER — Other Ambulatory Visit: Payer: Self-pay | Admitting: Internal Medicine

## 2020-07-28 DIAGNOSIS — M4802 Spinal stenosis, cervical region: Secondary | ICD-10-CM

## 2020-07-28 DIAGNOSIS — G894 Chronic pain syndrome: Secondary | ICD-10-CM

## 2020-07-28 MED ORDER — METHADONE HCL 10 MG PO TABS: 20.0000 mg | ORAL_TABLET | Freq: Three times a day (TID) | ORAL | 0 refills | Status: DC | PRN

## 2020-07-29 ENCOUNTER — Encounter: Payer: Self-pay | Admitting: Internal Medicine

## 2020-07-29 ENCOUNTER — Telehealth: Payer: Self-pay

## 2020-07-29 NOTE — Telephone Encounter (Signed)
She was on a higher dose when I inherited her from another years ago. I have lowered the dose over the last year. I will lower the dose again with the next refill.  Emily [name]

## 2020-07-29 NOTE — Telephone Encounter (Signed)
[facility] called stating that the methadone rx needs to have a ICD10 code attached to it for them to fill. They also need chart notes on why the pt is on such a high dose. Please advise.   Fax #: [phone]

## 2020-07-29 NOTE — Telephone Encounter (Signed)
Pharmacist bas been informed via fax as documentation as requested.

## 2020-07-31 ENCOUNTER — Other Ambulatory Visit: Payer: Self-pay | Admitting: Internal Medicine

## 2020-08-13 ENCOUNTER — Other Ambulatory Visit: Payer: Medicaid Other

## 2020-08-26 ENCOUNTER — Other Ambulatory Visit: Payer: Self-pay | Admitting: Internal Medicine

## 2020-08-26 ENCOUNTER — Encounter: Payer: Self-pay | Admitting: Internal Medicine

## 2020-08-26 DIAGNOSIS — M4802 Spinal stenosis, cervical region: Secondary | ICD-10-CM

## 2020-08-26 DIAGNOSIS — G894 Chronic pain syndrome: Secondary | ICD-10-CM

## 2020-08-26 MED ORDER — METHADONE HCL 10 MG PO TABS: 20.0000 mg | ORAL_TABLET | Freq: Three times a day (TID) | ORAL | 0 refills | Status: DC | PRN

## 2020-09-11 ENCOUNTER — Ambulatory Visit: Payer: Medicaid Other | Admitting: Internal Medicine

## 2020-09-12 ENCOUNTER — Other Ambulatory Visit: Payer: Medicaid Other

## 2020-09-16 ENCOUNTER — Encounter: Payer: Self-pay | Admitting: Internal Medicine

## 2020-09-18 ENCOUNTER — Ambulatory Visit: Payer: Medicaid Other | Admitting: Internal Medicine

## 2020-09-22 ENCOUNTER — Encounter: Payer: Self-pay | Admitting: Internal Medicine

## 2020-09-23 ENCOUNTER — Encounter: Payer: Self-pay | Admitting: Internal Medicine

## 2020-09-23 ENCOUNTER — Other Ambulatory Visit: Payer: Self-pay | Admitting: Internal Medicine

## 2020-09-23 ENCOUNTER — Inpatient Hospital Stay: Admission: RE | Admit: 2020-09-23 | Payer: Medicaid Other | Source: Ambulatory Visit

## 2020-09-23 DIAGNOSIS — M4802 Spinal stenosis, cervical region: Secondary | ICD-10-CM

## 2020-09-23 DIAGNOSIS — G894 Chronic pain syndrome: Secondary | ICD-10-CM

## 2020-09-23 DIAGNOSIS — I10 Essential (primary) hypertension: Secondary | ICD-10-CM

## 2020-09-23 MED ORDER — METHADONE HCL 10 MG PO TABS: 20.0000 mg | ORAL_TABLET | Freq: Three times a day (TID) | ORAL | 0 refills | Status: DC | PRN

## 2020-09-23 MED ORDER — CLONIDINE 0.2 MG/24HR TD PTWK: 0.2000 mg | MEDICATED_PATCH | TRANSDERMAL | 0 refills | Status: DC

## 2020-09-23 NOTE — Telephone Encounter (Signed)
Done erx 

## 2020-10-08 ENCOUNTER — Ambulatory Visit
Admission: RE | Admit: 2020-10-08 | Discharge: 2020-10-08 | Disposition: A | Discharge status: Home / Self Care | Payer: 59 | Source: Ambulatory Visit | Attending: Internal Medicine | Admitting: Internal Medicine

## 2020-10-08 DIAGNOSIS — R7989 Other specified abnormal findings of blood chemistry: Secondary | ICD-10-CM

## 2020-10-08 MED ORDER — IOPAMIDOL (ISOVUE-300) INJECTION 61%
100.0000 mL | Freq: Once | INTRAVENOUS | Status: AC | PRN
  Administered 2020-10-08: 100 mL via INTRAVENOUS

## 2020-10-09 ENCOUNTER — Encounter: Payer: Self-pay | Admitting: Internal Medicine

## 2020-10-09 ENCOUNTER — Ambulatory Visit (INDEPENDENT_AMBULATORY_CARE_PROVIDER_SITE_OTHER): Payer: 59 | Admitting: Internal Medicine

## 2020-10-09 ENCOUNTER — Other Ambulatory Visit: Payer: Self-pay

## 2020-10-09 VITALS — BP 180/120 | HR 65 | Temp 98.2°F | Resp 16 | Ht 60.0 in | Wt 152.0 lb

## 2020-10-09 DIAGNOSIS — D520 Dietary folate deficiency anemia: Secondary | ICD-10-CM | POA: Diagnosis not present

## 2020-10-09 DIAGNOSIS — Z0000 Encounter for general adult medical examination without abnormal findings: Secondary | ICD-10-CM

## 2020-10-09 DIAGNOSIS — L602 Onychogryphosis: Secondary | ICD-10-CM | POA: Insufficient documentation

## 2020-10-09 DIAGNOSIS — K7581 Nonalcoholic steatohepatitis (NASH): Secondary | ICD-10-CM

## 2020-10-09 DIAGNOSIS — R739 Hyperglycemia, unspecified: Secondary | ICD-10-CM | POA: Diagnosis not present

## 2020-10-09 DIAGNOSIS — N1831 Chronic kidney disease, stage 3a: Secondary | ICD-10-CM | POA: Diagnosis not present

## 2020-10-09 DIAGNOSIS — Z1211 Encounter for screening for malignant neoplasm of colon: Secondary | ICD-10-CM | POA: Insufficient documentation

## 2020-10-09 DIAGNOSIS — E559 Vitamin D deficiency, unspecified: Secondary | ICD-10-CM | POA: Diagnosis not present

## 2020-10-09 DIAGNOSIS — I10 Essential (primary) hypertension: Secondary | ICD-10-CM | POA: Diagnosis not present

## 2020-10-09 DIAGNOSIS — E781 Pure hyperglyceridemia: Secondary | ICD-10-CM | POA: Diagnosis not present

## 2020-10-09 DIAGNOSIS — E785 Hyperlipidemia, unspecified: Secondary | ICD-10-CM | POA: Diagnosis not present

## 2020-10-09 DIAGNOSIS — Z1231 Encounter for screening mammogram for malignant neoplasm of breast: Secondary | ICD-10-CM

## 2020-10-09 DIAGNOSIS — Z720 Tobacco use: Secondary | ICD-10-CM

## 2020-10-09 LAB — URINALYSIS, ROUTINE W REFLEX MICROSCOPIC
Bilirubin Urine: NEGATIVE
Ketones, ur: NEGATIVE
Leukocytes,Ua: NEGATIVE
Nitrite: NEGATIVE
Specific Gravity, Urine: 1.01 (ref 1.000–1.030)
Total Protein, Urine: NEGATIVE
Urine Glucose: NEGATIVE
Urobilinogen, UA: 0.2 (ref 0.0–1.0)
pH: 7 (ref 5.0–8.0)

## 2020-10-09 LAB — BASIC METABOLIC PANEL
BUN: 11 mg/dL (ref 6–23)
CO2: 28 mEq/L (ref 19–32)
Calcium: 9.8 mg/dL (ref 8.4–10.5)
Chloride: 102 mEq/L (ref 96–112)
Creatinine, Ser: 1.08 mg/dL (ref 0.40–1.20)
GFR: 57.68 mL/min — ABNORMAL LOW (ref >60.00)
Glucose, Bld: 96 mg/dL (ref 70–99)
Potassium: 4 mEq/L (ref 3.5–5.1)
Sodium: 137 mEq/L (ref 135–145)

## 2020-10-09 LAB — VITAMIN D 25 HYDROXY (VIT D DEFICIENCY, FRACTURES): VITD: 19.9 ng/mL — ABNORMAL LOW (ref 30.00–100.00)

## 2020-10-09 LAB — PROTIME-INR
INR: 1.1 ratio — ABNORMAL HIGH (ref 0.8–1.0)
Prothrombin Time: 11.9 s (ref 9.6–13.1)

## 2020-10-09 LAB — CBC WITH DIFFERENTIAL/PLATELET
Basophils Absolute: 0.1 10*3/uL (ref 0.0–0.1)
Basophils Relative: 1.1 % (ref 0.0–3.0)
Eosinophils Absolute: 0.2 10*3/uL (ref 0.0–0.7)
Eosinophils Relative: 2.9 % (ref 0.0–5.0)
HCT: 43.6 % (ref 36.0–46.0)
Hemoglobin: 14.7 g/dL (ref 12.0–15.0)
Lymphocytes Relative: 26 % (ref 12.0–46.0)
Lymphs Abs: 1.9 10*3/uL (ref 0.7–4.0)
MCHC: 33.6 g/dL (ref 30.0–36.0)
MCV: 86.7 fl (ref 78.0–100.0)
Monocytes Absolute: 0.6 10*3/uL (ref 0.1–1.0)
Monocytes Relative: 8.1 % (ref 3.0–12.0)
Neutro Abs: 4.6 10*3/uL (ref 1.4–7.7)
Neutrophils Relative %: 61.9 % (ref 43.0–77.0)
Platelets: 218 10*3/uL (ref 150.0–400.0)
RBC: 5.03 Mil/uL (ref 3.87–5.11)
RDW: 15.7 % — ABNORMAL HIGH (ref 11.5–15.5)
WBC: 7.5 10*3/uL (ref 4.0–10.5)

## 2020-10-09 LAB — HEPATIC FUNCTION PANEL
ALT: 8 U/L (ref 0–35)
AST: 20 U/L (ref 0–37)
Albumin: 4.2 g/dL (ref 3.5–5.2)
Alkaline Phosphatase: 95 U/L (ref 39–117)
Bilirubin, Direct: 0.1 mg/dL (ref 0.0–0.3)
Total Bilirubin: 0.4 mg/dL (ref 0.2–1.2)
Total Protein: 7.7 g/dL (ref 6.0–8.3)

## 2020-10-09 LAB — LIPID PANEL
Cholesterol: 167 mg/dL (ref 0–200)
HDL: 28.5 mg/dL — ABNORMAL LOW (ref >39.00)
NonHDL: 138.07
Total CHOL/HDL Ratio: 6
Triglycerides: 229 mg/dL — ABNORMAL HIGH (ref 0.0–149.0)
VLDL: 45.8 mg/dL — ABNORMAL HIGH (ref 0.0–40.0)

## 2020-10-09 LAB — FOLATE: Folate: 3.2 ng/mL — ABNORMAL LOW (ref >5.9)

## 2020-10-09 LAB — HEMOGLOBIN A1C: Hgb A1c MFr Bld: 5.9 % (ref 4.6–6.5)

## 2020-10-09 LAB — LDL CHOLESTEROL, DIRECT: Direct LDL: 95 mg/dL

## 2020-10-09 LAB — TSH: TSH: 1.75 u[IU]/mL (ref 0.35–4.50)

## 2020-10-09 MED ORDER — ATORVASTATIN CALCIUM 10 MG PO TABS: 10.0000 mg | ORAL_TABLET | Freq: Every day | ORAL | 1 refills | Status: DC

## 2020-10-09 NOTE — Progress Notes (Signed)
Subjective:  Patient ID: Emily [name], female    DOB: 09/12/1965  Age: 55 y.o. MRN: [id number]  CC: Annual Exam, Hypertension, and Hyperlipidemia  This visit occurred during the SARS-CoV-2 public health emergency.  Safety protocols were in place, including screening questions prior to the visit, additional usage of staff PPE, and extensive cleaning of exam room while observing appropriate contact time as indicated for disinfecting solutions.    HPI Emily [name] presents for a CPX.  She is not taking indapamide.  She stopped taking it 2 months ago.  She claims it caused headaches.  She has had no more headache since she stopped taking it.  She tells me she is compliant with clonidine, carvedilol, and telmisartan.  She denies headache, blurred vision, chest pain, or shortness of breath.  Outpatient Medications Prior to Visit  Medication Sig Dispense Refill  . Budeson-Glycopyrrol-Formoterol (BREZTRI AEROSPHERE) 160-9-4.8 MCG/ACT AERO Inhale 2 puffs into the lungs in the morning and at bedtime. 32.1 g 0  . cloNIDine (CATAPRES - DOSED IN MG/24 HR) 0.2 mg/24hr patch Place 1 patch (0.2 mg total) onto the skin once a week. 12 patch 0  . cyanocobalamin 2000 MCG tablet Take 1 tablet (2,000 mcg total) by mouth daily. 90 tablet 1  . methadone (DOLOPHINE) 10 MG tablet Take 2 tablets (20 mg total) by mouth every 8 (eight) hours as needed. 180 tablet 0  . Omega-3 Fatty Acids (FISH OIL) 1000 MG CAPS Take 2 capsules by mouth every evening.    . Vitamin D, Ergocalciferol, (DRISDOL) 1.25 MG (50000 UNIT) CAPS capsule Take 50,000 Units by mouth every 7 (seven) days.    Marland Kitchen. atorvastatin (LIPITOR) 10 MG tablet Take 1 tablet (10 mg total) by mouth daily. 90 tablet 1  . carvedilol (COREG) 25 MG tablet Take 1 tablet (25 mg total) by mouth 2 (two) times daily with a meal. 180 tablet 0  . Cholecalciferol 2000 units TABS Take 1 tablet (2,000 Units total) by mouth daily. 90 tablet 1  . indapamide (LOZOL) 2.5 MG  tablet Take 1 tablet (2.5 mg total) by mouth daily. 90 tablet 1  . olmesartan (BENICAR) 40 MG tablet Take 1 tablet (40 mg total) by mouth daily. 90 tablet 1  . potassium chloride SA (K-DUR,KLOR-CON) 20 MEQ tablet Take 1 tablet (20 mEq total) by mouth 3 (three) times daily. 270 tablet 1   No facility-administered medications prior to visit.    ROS Review of Systems  Constitutional: Negative for appetite change, diaphoresis, fatigue and unexpected weight change.  HENT: Negative.   Eyes: Negative.  Negative for visual disturbance.  Respiratory: Negative for cough, chest tightness, shortness of breath and wheezing.   Cardiovascular: Negative for chest pain, palpitations and leg swelling.  Gastrointestinal: Negative for abdominal pain, blood in stool, constipation, diarrhea, nausea and vomiting.  Endocrine: Negative.   Genitourinary: Negative.  Negative for difficulty urinating.  Musculoskeletal: Positive for neck pain. Negative for arthralgias and back pain.  Skin: Negative.  Negative for color change and pallor.  Neurological: Negative.  Negative for weakness, light-headedness and headaches.  Hematological: Negative for adenopathy. Does not bruise/bleed easily.  Psychiatric/Behavioral: Negative.     Objective:  BP (!) 180/120   Pulse 65   Temp 98.2 F (36.8 C) (Oral)   Resp 16   Ht 5' (1.524 m)   Wt 152 lb (68.9 kg)   SpO2 97%   BMI 29.69 kg/m   BP Readings from Last 3 Encounters:  10/09/20 (!) 180/120  06/03/20 (!) 204/120  04/03/20 (!) 211/122    Wt Readings from Last 3 Encounters:  10/09/20 152 lb (68.9 kg)  06/03/20 160 lb (72.6 kg)  04/03/20 164 lb 2 oz (74.4 kg)    Physical Exam Vitals reviewed.  Constitutional:      Appearance: Normal appearance.  HENT:     Nose: Nose normal.     Mouth/Throat:     Mouth: Mucous membranes are moist.  Eyes:     General: No scleral icterus.    Conjunctiva/sclera: Conjunctivae normal.  Cardiovascular:     Rate and Rhythm:  Normal rate and regular rhythm.     Heart sounds: No murmur heard.   Pulmonary:     Effort: Pulmonary effort is normal.     Breath sounds: No stridor. No wheezing, rhonchi or rales.  Abdominal:     General: Abdomen is flat. Bowel sounds are normal. There is no distension.     Palpations: Abdomen is soft. There is no hepatomegaly, splenomegaly or mass.     Tenderness: There is no abdominal tenderness.  Musculoskeletal:        General: Normal range of motion.     Cervical back: Neck supple.     Right lower leg: No edema.     Left lower leg: No edema.  Lymphadenopathy:     Cervical: No cervical adenopathy.  Skin:    General: Skin is warm and dry.     Coloration: Skin is not pale.  Neurological:     General: No focal deficit present.     Mental Status: She is alert.  Psychiatric:        Mood and Affect: Mood normal.        Behavior: Behavior normal.     Lab Results  Component Value Date   WBC 7.5 10/09/2020   HGB 14.7 10/09/2020   HCT 43.6 10/09/2020   PLT 218.0 10/09/2020   GLUCOSE 96 10/09/2020   CHOL 167 10/09/2020   TRIG 229.0 (H) 10/09/2020   HDL 28.50 (L) 10/09/2020   LDLDIRECT 95.0 10/09/2020   LDLCALC 92 10/31/2017   ALT 8 10/09/2020   AST 20 10/09/2020   NA 137 10/09/2020   K 4.0 10/09/2020   CL 102 10/09/2020   CREATININE 1.08 10/09/2020   BUN 11 10/09/2020   CO2 28 10/09/2020   TSH 1.75 10/09/2020   INR 1.1 (H) 10/09/2020   HGBA1C 5.9 10/09/2020    CT Abdomen Pelvis W Contrast  Result Date: 10/09/2020 CLINICAL DATA:  Unintended weight loss. 10 pound weight loss in 6 months. EXAM: CT ABDOMEN AND PELVIS WITH CONTRAST TECHNIQUE: Multidetector CT imaging of the abdomen and pelvis was performed using the standard protocol following bolus administration of intravenous contrast. Creatinine was obtained on site at [facility] at [address] Results: Creatinine 1.0 mg/dL. CONTRAST:  ISOVUE-300 IOPAMIDOL (ISOVUE-300) INJECTION 61% COMPARISON:   Renal ultrasound 08/15/2017. Abdominopelvic CT 04/24/2008 FINDINGS: Lower chest: Mild atelectasis or scarring in the lingula. No basilar airspace disease or pulmonary nodule. No pleural fluid. Heart is normal in size. There is fluid within the distal esophagus. Hepatobiliary: Mild decreased hepatic density typical of steatosis. No focal liver lesion. Gallbladder physiologically distended, no calcified stone. Gallstone on prior renal ultrasound is not seen by CT. There is flaring of the distal common bile duct at 8 mm. No intrahepatic biliary ductal dilatation. Pancreas: No ductal dilatation or inflammation. No evidence of pancreatic mass. Spleen: Normal in size without focal abnormality. Adrenals/Urinary Tract:  Normal adrenal glands. No hydronephrosis or perinephric edema. Homogeneous renal enhancement with symmetric excretion on delayed phase imaging. No evidence of renal stone or focal lesion. Urinary bladder is partially distended without wall thickening. Stomach/Bowel: Fluid within the distal esophagus without wall thickening. The stomach is unremarkable. Normal positioning of the duodenum and ligament of Treitz. Normal small bowel without wall thickening, inflammation, or obstruction. The appendix is surgically absent. The terminal ileum is normal. Cecum is slightly high-riding in the mid abdomen. Moderate volume of stool throughout the colon. Tortuosity of the transverse, descending, and sigmoid colon. No colonic wall thickening or pericolonic edema. No evidence of colonic mass. No significant diverticular disease. Vascular/Lymphatic: Mild to moderate aorto bi-iliac atherosclerosis. No aortic aneurysm. Patent portal vein. No acute vascular findings. No enlarged lymph nodes in the abdomen or pelvis. Reproductive: Normal CT appearance of the uterus. Quiescent ovaries without adnexal mass. Other: No ascites. Small fat containing umbilical hernia. No evidence of intra-abdominal mass. Musculoskeletal: Punctate  sclerotic density in L1 is unchanged from prior consistent with bone island. There are no acute or suspicious osseous abnormalities. IMPRESSION: 1. Flaring of the distal common bile duct to 8 mm. There is no intrahepatic biliary ductal dilatation or abnormal gallbladder distension. Recommend correlation with LFTs. If elevated, recommend further evaluation with MRCP. 2. Fluid within the distal esophagus, can be seen with reflux or esophageal dysmotility. 3. Mild hepatic steatosis. 4. Moderate colonic stool burden with colonic tortuosity, can be seen with constipation. Aortic Atherosclerosis (ICD10-I70.0). Electronically Signed   By: Narda Rutherford M.D.   On: 10/09/2020 12:40    Assessment & Plan:   Suda was seen today for annual exam, hypertension and hyperlipidemia.  Diagnoses and all orders for this visit:  Essential hypertension- Her blood pressure is not adequately well controlled.  I will check her labs to screen for secondary causes and endorgan damage.  I have asked her to try a different diuretic. -     Basic metabolic panel; Future -     Aldosterone + renin activity w/ ratio; Future -     TSH; Future -     Basic metabolic panel -     Aldosterone + renin activity w/ ratio -     TSH -     carvedilol (COREG) 25 MG tablet; Take 1 tablet (25 mg total) by mouth 2 (two) times daily with a meal. -     olmesartan (BENICAR) 40 MG tablet; Take 1 tablet (40 mg total) by mouth daily. -     triamterene-hydrochlorothiazide (DYAZIDE) 37.5-25 MG capsule; Take 1 each (1 capsule total) by mouth daily.  Stage 3a chronic kidney disease ([facility])- Her renal function is stable.  Will try to get better control of her blood pressure. -     CBC with Differential/Platelet; Future -     Basic metabolic panel; Future -     Urinalysis, Routine w reflex microscopic; Future -     CBC with Differential/Platelet -     Basic metabolic panel -     Urinalysis, Routine w reflex microscopic  Dietary folate deficiency  anemia- Her folate level is low.  I have asked her to start taking a folate supplement. -     CBC with Differential/Platelet; Future -     Folate; Future -     CBC with Differential/Platelet -     Folate -     folic acid (FOLVITE) 1 MG tablet; Take 1 tablet (1 mg total) by mouth daily.  Hyperlipidemia with  target LDL less than 70- She has an elevated ASCVD risk score.  I have asked her to take a statin for cardiovascular risk reduction. -     Lipid panel; Future -     Hepatic function panel; Future -     Lipid panel -     Hepatic function panel -     atorvastatin (LIPITOR) 10 MG tablet; Take 1 tablet (10 mg total) by mouth daily.  Hypertriglyceridemia- Her triglycerides are not high enough to warrant medical therapy. -     Lipid panel; Future -     Lipid panel  Visit for screening mammogram -     MM DIGITAL SCREENING BILATERAL; Future  Vitamin D deficiency -     VITAMIN D 25 Hydroxy (Vit-D Deficiency, Fractures); Future -     VITAMIN D 25 Hydroxy (Vit-D Deficiency, Fractures)  Routine general medical examination at a health care facility-exam completed, labs reviewed, vaccines reviewed and updated, cancer screenings addressed, patient education was given.  NASH (nonalcoholic steatohepatitis)- Her LFTs are normal now. -     Hepatic function panel; Future -     Protime-INR; Future -     Hepatic function panel -     Protime-INR  Hyperglycemia -     Hemoglobin A1c; Future -     Hemoglobin A1c  Screen for colon cancer  Colon cancer screening -     Cologuard  Overgrown toenails -     Ambulatory referral to Podiatry  Tobacco abuse -     Ambulatory Referral for Lung Cancer Scre  Other orders -     LDL cholesterol, direct   I have discontinued Stann Mainland Budzinski's Cholecalciferol, potassium chloride SA, and indapamide. I am also having her start on triamterene-hydrochlorothiazide and folic acid. Additionally, I am having her maintain her cyanocobalamin, Vitamin D  (Ergocalciferol), Fish Oil, Breztri Aerosphere, cloNIDine, methadone, atorvastatin, carvedilol, and olmesartan.  Meds ordered this encounter  Medications  . atorvastatin (LIPITOR) 10 MG tablet    Sig: Take 1 tablet (10 mg total) by mouth daily.    Dispense:  90 tablet    Refill:  1  . carvedilol (COREG) 25 MG tablet    Sig: Take 1 tablet (25 mg total) by mouth 2 (two) times daily with a meal.    Dispense:  180 tablet    Refill:  0  . olmesartan (BENICAR) 40 MG tablet    Sig: Take 1 tablet (40 mg total) by mouth daily.    Dispense:  90 tablet    Refill:  1  . triamterene-hydrochlorothiazide (DYAZIDE) 37.5-25 MG capsule    Sig: Take 1 each (1 capsule total) by mouth daily.    Dispense:  90 capsule    Refill:  0  . folic acid (FOLVITE) 1 MG tablet    Sig: Take 1 tablet (1 mg total) by mouth daily.    Dispense:  90 tablet    Refill:  1     Follow-up: Return in about 3 months (around 01/09/2021).  Sanda Linger, MD

## 2020-10-09 NOTE — Patient Instructions (Signed)
Health Maintenance, Female Adopting a healthy lifestyle and getting preventive care are important in promoting health and wellness. Ask your health care provider about:  The right schedule for you to have regular tests and exams.  Things you can do on your own to prevent diseases and keep yourself healthy. What should I know about diet, weight, and exercise? Eat a healthy diet  Eat a diet that includes plenty of vegetables, fruits, low-fat dairy products, and lean protein.  Do not eat a lot of foods that are high in solid fats, added sugars, or sodium.   Maintain a healthy weight Body mass index (BMI) is used to identify weight problems. It estimates body fat based on height and weight. Your health care provider can help determine your BMI and help you achieve or maintain a healthy weight. Get regular exercise Get regular exercise. This is one of the most important things you can do for your health. Most adults should:  Exercise for at least 150 minutes each week. The exercise should increase your heart rate and make you sweat (moderate-intensity exercise).  Do strengthening exercises at least twice a week. This is in addition to the moderate-intensity exercise.  Spend less time sitting. Even light physical activity can be beneficial. Watch cholesterol and blood lipids Have your blood tested for lipids and cholesterol at 56 years of age, then have this test every 5 years. Have your cholesterol levels checked more often if:  Your lipid or cholesterol levels are high.  You are older than 56 years of age.  You are at high risk for heart disease. What should I know about cancer screening? Depending on your health history and family history, you may need to have cancer screening at various ages. This may include screening for:  Breast cancer.  Cervical cancer.  Colorectal cancer.  Skin cancer.  Lung cancer. What should I know about heart disease, diabetes, and high blood  pressure? Blood pressure and heart disease  High blood pressure causes heart disease and increases the risk of stroke. This is more likely to develop in people who have high blood pressure readings, are of African descent, or are overweight.  Have your blood pressure checked: ? Every 3-5 years if you are 18-39 years of age. ? Every year if you are 40 years old or older. Diabetes Have regular diabetes screenings. This checks your fasting blood sugar level. Have the screening done:  Once every three years after age 40 if you are at a normal weight and have a low risk for diabetes.  More often and at a younger age if you are overweight or have a high risk for diabetes. What should I know about preventing infection? Hepatitis B If you have a higher risk for hepatitis B, you should be screened for this virus. Talk with your health care provider to find out if you are at risk for hepatitis B infection. Hepatitis C Testing is recommended for:  Everyone born from 1945 through 1965.  Anyone with known risk factors for hepatitis C. Sexually transmitted infections (STIs)  Get screened for STIs, including gonorrhea and chlamydia, if: ? You are sexually active and are younger than 56 years of age. ? You are older than 56 years of age and your health care provider tells you that you are at risk for this type of infection. ? Your sexual activity has changed since you were last screened, and you are at increased risk for chlamydia or gonorrhea. Ask your health care provider   if you are at risk.  Ask your health care provider about whether you are at high risk for HIV. Your health care provider may recommend a prescription medicine to help prevent HIV infection. If you choose to take medicine to prevent HIV, you should first get tested for HIV. You should then be tested every 3 months for as long as you are taking the medicine. Pregnancy  If you are about to stop having your period (premenopausal) and  you may become pregnant, seek counseling before you get pregnant.  Take 400 to 800 micrograms (mcg) of folic acid every day if you become pregnant.  Ask for birth control (contraception) if you want to prevent pregnancy. Osteoporosis and menopause Osteoporosis is a disease in which the bones lose minerals and strength with aging. This can result in bone fractures. If you are 65 years old or older, or if you are at risk for osteoporosis and fractures, ask your health care provider if you should:  Be screened for bone loss.  Take a calcium or vitamin D supplement to lower your risk of fractures.  Be given hormone replacement therapy (HRT) to treat symptoms of menopause. Follow these instructions at home: Lifestyle  Do not use any products that contain nicotine or tobacco, such as cigarettes, e-cigarettes, and chewing tobacco. If you need help quitting, ask your health care provider.  Do not use street drugs.  Do not share needles.  Ask your health care provider for help if you need support or information about quitting drugs. Alcohol use  Do not drink alcohol if: ? Your health care provider tells you not to drink. ? You are pregnant, may be pregnant, or are planning to become pregnant.  If you drink alcohol: ? Limit how much you use to 0-1 drink a day. ? Limit intake if you are breastfeeding.  Be aware of how much alcohol is in your drink. In the [place] one drink equals one 12 oz bottle of beer (355 mL), one 5 oz glass of wine (148 mL), or one 1 oz glass of hard liquor (44 mL). General instructions  Schedule regular health, dental, and eye exams.  Stay current with your vaccines.  Tell your health care provider if: ? You often feel depressed. ? You have ever been abused or do not feel safe at home. Summary  Adopting a healthy lifestyle and getting preventive care are important in promoting health and wellness.  Follow your health care provider's instructions about healthy  diet, exercising, and getting tested or screened for diseases.  Follow your health care provider's instructions on monitoring your cholesterol and blood pressure. This information is not intended to replace advice given to you by your health care provider. Make sure you discuss any questions you have with your health care provider. Document Revised: 07/19/2018 Document Reviewed: 07/19/2018 Elsevier Patient Education  2021 Elsevier Inc.  

## 2020-10-10 MED ORDER — FOLIC ACID 1 MG PO TABS: 1.0000 mg | ORAL_TABLET | Freq: Every day | ORAL | 1 refills | Status: DC

## 2020-10-10 MED ORDER — CHOLECALCIFEROL 50 MCG (2000 UT) PO TABS
1.0000 | ORAL_TABLET | Freq: Every day | ORAL | 1 refills | Status: AC
Start: 2020-10-10 — End: ?

## 2020-10-10 MED ORDER — TRIAMTERENE-HCTZ 37.5-25 MG PO CAPS: 1.0000 | ORAL_CAPSULE | Freq: Every day | ORAL | 0 refills | Status: DC

## 2020-10-10 MED ORDER — OLMESARTAN MEDOXOMIL 40 MG PO TABS: 40.0000 mg | ORAL_TABLET | Freq: Every day | ORAL | 1 refills | Status: DC

## 2020-10-10 MED ORDER — CARVEDILOL 25 MG PO TABS: 25.0000 mg | ORAL_TABLET | Freq: Two times a day (BID) | ORAL | 0 refills | Status: DC

## 2020-10-15 LAB — ALDOSTERONE + RENIN ACTIVITY W/ RATIO
ALDO / PRA Ratio: 27.3 Ratio (ref 0.9–28.9)
Aldosterone: 3 ng/dL
Renin Activity: 0.11 ng/mL/h — ABNORMAL LOW (ref 0.25–5.82)

## 2020-10-21 ENCOUNTER — Other Ambulatory Visit: Payer: Self-pay | Admitting: Internal Medicine

## 2020-10-21 ENCOUNTER — Encounter: Payer: Self-pay | Admitting: Internal Medicine

## 2020-10-21 DIAGNOSIS — M4802 Spinal stenosis, cervical region: Secondary | ICD-10-CM

## 2020-10-21 DIAGNOSIS — G894 Chronic pain syndrome: Secondary | ICD-10-CM

## 2020-10-21 MED ORDER — METHADONE HCL 10 MG PO TABS: 20.0000 mg | ORAL_TABLET | Freq: Three times a day (TID) | ORAL | 0 refills | Status: DC | PRN

## 2020-10-24 ENCOUNTER — Other Ambulatory Visit: Payer: Self-pay | Admitting: Internal Medicine

## 2020-10-24 DIAGNOSIS — I10 Essential (primary) hypertension: Secondary | ICD-10-CM

## 2020-10-30 ENCOUNTER — Ambulatory Visit: Payer: 59 | Admitting: Podiatry

## 2020-11-18 ENCOUNTER — Other Ambulatory Visit: Payer: Self-pay | Admitting: Internal Medicine

## 2020-11-18 DIAGNOSIS — M4802 Spinal stenosis, cervical region: Secondary | ICD-10-CM

## 2020-11-18 DIAGNOSIS — G894 Chronic pain syndrome: Secondary | ICD-10-CM

## 2020-11-19 MED ORDER — METHADONE HCL 10 MG PO TABS: 20.0000 mg | ORAL_TABLET | Freq: Three times a day (TID) | ORAL | 0 refills | Status: DC | PRN

## 2020-11-25 ENCOUNTER — Ambulatory Visit: Payer: 59 | Admitting: Podiatry

## 2020-12-11 ENCOUNTER — Ambulatory Visit: Payer: 59 | Admitting: Internal Medicine

## 2020-12-15 ENCOUNTER — Encounter: Payer: Self-pay | Admitting: Internal Medicine

## 2020-12-17 ENCOUNTER — Other Ambulatory Visit: Payer: Self-pay | Admitting: Internal Medicine

## 2020-12-17 DIAGNOSIS — M4802 Spinal stenosis, cervical region: Secondary | ICD-10-CM

## 2020-12-17 DIAGNOSIS — G894 Chronic pain syndrome: Secondary | ICD-10-CM

## 2020-12-18 ENCOUNTER — Ambulatory Visit: Payer: 59 | Admitting: Internal Medicine

## 2020-12-18 MED ORDER — METHADONE HCL 10 MG PO TABS: 20.0000 mg | ORAL_TABLET | Freq: Three times a day (TID) | ORAL | 0 refills | Status: DC | PRN

## 2020-12-23 LAB — COLOGUARD: Cologuard: NEGATIVE

## 2021-01-07 ENCOUNTER — Ambulatory Visit: Payer: Medicaid Other | Admitting: Endocrinology

## 2021-01-13 ENCOUNTER — Other Ambulatory Visit: Payer: Self-pay

## 2021-01-14 ENCOUNTER — Other Ambulatory Visit: Payer: Self-pay

## 2021-01-14 ENCOUNTER — Ambulatory Visit (INDEPENDENT_AMBULATORY_CARE_PROVIDER_SITE_OTHER): Payer: 59 | Admitting: Internal Medicine

## 2021-01-14 ENCOUNTER — Encounter: Payer: Self-pay | Admitting: Internal Medicine

## 2021-01-14 VITALS — BP 172/116 | HR 74 | Temp 98.3°F | Resp 16 | Ht 60.0 in | Wt 156.0 lb

## 2021-01-14 DIAGNOSIS — Z1231 Encounter for screening mammogram for malignant neoplasm of breast: Secondary | ICD-10-CM

## 2021-01-14 DIAGNOSIS — J449 Chronic obstructive pulmonary disease, unspecified: Secondary | ICD-10-CM

## 2021-01-14 DIAGNOSIS — Z79891 Long term (current) use of opiate analgesic: Secondary | ICD-10-CM

## 2021-01-14 DIAGNOSIS — M4802 Spinal stenosis, cervical region: Secondary | ICD-10-CM | POA: Diagnosis not present

## 2021-01-14 DIAGNOSIS — G894 Chronic pain syndrome: Secondary | ICD-10-CM

## 2021-01-14 DIAGNOSIS — N1831 Chronic kidney disease, stage 3a: Secondary | ICD-10-CM

## 2021-01-14 DIAGNOSIS — I10 Essential (primary) hypertension: Secondary | ICD-10-CM | POA: Diagnosis not present

## 2021-01-14 LAB — BASIC METABOLIC PANEL
BUN: 15 mg/dL (ref 6–23)
CO2: 29 mEq/L (ref 19–32)
Calcium: 10 mg/dL (ref 8.4–10.5)
Chloride: 102 mEq/L (ref 96–112)
Creatinine, Ser: 1.11 mg/dL (ref 0.40–1.20)
GFR: 55.71 mL/min — ABNORMAL LOW (ref >60.00)
Glucose, Bld: 99 mg/dL (ref 70–99)
Potassium: 4.2 mEq/L (ref 3.5–5.1)
Sodium: 139 mEq/L (ref 135–145)

## 2021-01-14 MED ORDER — TRIAMTERENE-HCTZ 37.5-25 MG PO CAPS: 1.0000 | ORAL_CAPSULE | Freq: Every day | ORAL | 0 refills | Status: DC

## 2021-01-14 MED ORDER — METHADONE HCL 10 MG PO TABS: 20.0000 mg | ORAL_TABLET | Freq: Three times a day (TID) | ORAL | 0 refills | Status: DC | PRN

## 2021-01-14 MED ORDER — CARVEDILOL PHOSPHATE ER 40 MG PO CP24: 40.0000 mg | ORAL_CAPSULE | Freq: Every day | ORAL | 1 refills | Status: DC

## 2021-01-14 MED ORDER — CLONIDINE 0.2 MG/24HR TD PTWK: 0.2000 mg | MEDICATED_PATCH | TRANSDERMAL | 1 refills | Status: DC

## 2021-01-14 NOTE — Progress Notes (Signed)
Subjective:  Patient ID: Emily [name], female    DOB: 15-Jun-1966  Age: 55 y.o. MRN: [id number]  CC: Hypertension  This visit occurred during the SARS-CoV-2 public health emergency.  Safety protocols were in place, including screening questions prior to the visit, additional usage of staff PPE, and extensive cleaning of exam room while observing appropriate contact time as indicated for disinfecting solutions.    HPI ANGE PUSKAS presents for f/up -  She is not taking the diuretic because she says when she does take it she feels her heart pounding.  She tells me she is only taking carvedilol once a day because she does not like twice daily medications.  She is using the clonidine patch.  She tells me she is compliant with ARB.  She tells me her blood pressure at home is usually in the 140-160/90-100 range.  She complains of chronic neck pain and requests a refill of methadone.  She occasionally takes Aleve to help with the pain.  She denies headache, blurred vision, chest pain, diaphoresis, dizziness, or lightheadedness.  She has mild intermittent wheezing and nonproductive cough.  Outpatient Medications Prior to Visit  Medication Sig Dispense Refill   atorvastatin (LIPITOR) 10 MG tablet Take 1 tablet (10 mg total) by mouth daily. 90 tablet 1   Cholecalciferol 50 MCG (2000 UT) TABS Take 1 tablet (2,000 Units total) by mouth daily. 90 tablet 1   cyanocobalamin 2000 MCG tablet Take 1 tablet (2,000 mcg total) by mouth daily. 90 tablet 1   folic acid (FOLVITE) 1 MG tablet Take 1 tablet (1 mg total) by mouth daily. 90 tablet 1   olmesartan (BENICAR) 40 MG tablet Take 1 tablet (40 mg total) by mouth daily. 90 tablet 1   Omega-3 Fatty Acids (FISH OIL) 1000 MG CAPS Take 2 capsules by mouth every evening.     Budeson-Glycopyrrol-Formoterol (BREZTRI AEROSPHERE) 160-9-4.8 MCG/ACT AERO Inhale 2 puffs into the lungs in the morning and at bedtime. 32.1 g 0   carvedilol (COREG) 25 MG tablet Take 1  tablet (25 mg total) by mouth 2 (two) times daily with a meal. 180 tablet 0   cloNIDine (CATAPRES - DOSED IN MG/24 HR) 0.2 mg/24hr patch APPLY 1 PATCH TO SKIN ONCE WEEKLY 12 patch 1   methadone (DOLOPHINE) 10 MG tablet Take 2 tablets (20 mg total) by mouth every 8 (eight) hours as needed. 180 tablet 0   Vitamin D, Ergocalciferol, (DRISDOL) 1.25 MG (50000 UNIT) CAPS capsule Take 50,000 Units by mouth every 7 (seven) days.     triamterene-hydrochlorothiazide (DYAZIDE) 37.5-25 MG capsule Take 1 each (1 capsule total) by mouth daily. (Patient not taking: Reported on 01/14/2021) 90 capsule 0   No facility-administered medications prior to visit.    ROS Review of Systems  Constitutional:  Negative for appetite change, diaphoresis and fatigue.  Eyes:  Negative for visual disturbance.  Respiratory:  Positive for cough and wheezing. Negative for chest tightness and shortness of breath.   Cardiovascular:  Negative for chest pain, palpitations and leg swelling.  Gastrointestinal:  Negative for abdominal pain, diarrhea, nausea and vomiting.  Genitourinary: Negative.  Negative for difficulty urinating, dysuria and hematuria.  Musculoskeletal:  Positive for neck pain. Negative for arthralgias, back pain and myalgias.  Neurological: Negative.  Negative for dizziness, weakness, light-headedness and numbness.  Hematological:  Negative for adenopathy. Does not bruise/bleed easily.  Psychiatric/Behavioral: Negative.     Objective:  BP (!) 172/116 (BP Location: Left Arm, Patient Position: Sitting, Cuff Size:  Large)   Pulse 74   Temp 98.3 F (36.8 C) (Oral)   Resp 16   Ht 5' (1.524 m)   Wt 156 lb (70.8 kg)   SpO2 97%   BMI 30.47 kg/m   BP Readings from Last 3 Encounters:  01/14/21 (!) 172/116  10/09/20 (!) 180/120  06/03/20 (!) 204/120    Wt Readings from Last 3 Encounters:  01/14/21 156 lb (70.8 kg)  10/09/20 152 lb (68.9 kg)  06/03/20 160 lb (72.6 kg)    Physical Exam Vitals reviewed.   Constitutional:      Appearance: She is not ill-appearing.  HENT:     Nose: Nose normal.     Mouth/Throat:     Mouth: Mucous membranes are moist.  Eyes:     General: No scleral icterus.    Conjunctiva/sclera: Conjunctivae normal.  Cardiovascular:     Rate and Rhythm: Normal rate and regular rhythm.     Heart sounds: No murmur heard. Pulmonary:     Effort: Pulmonary effort is normal.     Breath sounds: No stridor. No wheezing, rhonchi or rales.  Abdominal:     General: Abdomen is protuberant. Bowel sounds are normal. There is no distension.     Palpations: Abdomen is soft. There is no hepatomegaly, splenomegaly or mass.     Tenderness: There is no guarding.  Musculoskeletal:        General: Normal range of motion.     Cervical back: Neck supple.     Right lower leg: No edema.     Left lower leg: No edema.  Lymphadenopathy:     Cervical: No cervical adenopathy.  Skin:    General: Skin is warm and dry.  Neurological:     General: No focal deficit present.     Mental Status: She is alert.  Psychiatric:        Mood and Affect: Mood normal.        Behavior: Behavior normal.        Thought Content: Thought content normal.        Judgment: Judgment normal.    Lab Results  Component Value Date   WBC 7.5 10/09/2020   HGB 14.7 10/09/2020   HCT 43.6 10/09/2020   PLT 218.0 10/09/2020   GLUCOSE 99 01/14/2021   CHOL 167 10/09/2020   TRIG 229.0 (H) 10/09/2020   HDL 28.50 (L) 10/09/2020   LDLDIRECT 95.0 10/09/2020   LDLCALC 92 10/31/2017   ALT 8 10/09/2020   AST 20 10/09/2020   NA 139 01/14/2021   K 4.2 01/14/2021   CL 102 01/14/2021   CREATININE 1.11 01/14/2021   BUN 15 01/14/2021   CO2 29 01/14/2021   TSH 1.75 10/09/2020   INR 1.1 (H) 10/09/2020   HGBA1C 5.9 10/09/2020    CT Abdomen Pelvis W Contrast  Result Date: 10/09/2020 CLINICAL DATA:  Unintended weight loss. 10 pound weight loss in 6 months. EXAM: CT ABDOMEN AND PELVIS WITH CONTRAST TECHNIQUE: Multidetector CT  imaging of the abdomen and pelvis was performed using the standard protocol following bolus administration of intravenous contrast. Creatinine was obtained on site at [place] at [address] Results: Creatinine 1.0 mg/dL. CONTRAST:  146m ISOVUE-300 IOPAMIDOL (ISOVUE-300) INJECTION 61% COMPARISON:  Renal ultrasound 08/15/2017. Abdominopelvic CT 04/24/2008 FINDINGS: Lower chest: Mild atelectasis or scarring in the lingula. No basilar airspace disease or pulmonary nodule. No pleural fluid. Heart is normal in size. There is fluid within the distal esophagus. Hepatobiliary: Mild decreased hepatic  density typical of steatosis. No focal liver lesion. Gallbladder physiologically distended, no calcified stone. Gallstone on prior renal ultrasound is not seen by CT. There is flaring of the distal common bile duct at 8 mm. No intrahepatic biliary ductal dilatation. Pancreas: No ductal dilatation or inflammation. No evidence of pancreatic mass. Spleen: Normal in size without focal abnormality. Adrenals/Urinary Tract: Normal adrenal glands. No hydronephrosis or perinephric edema. Homogeneous renal enhancement with symmetric excretion on delayed phase imaging. No evidence of renal stone or focal lesion. Urinary bladder is partially distended without wall thickening. Stomach/Bowel: Fluid within the distal esophagus without wall thickening. The stomach is unremarkable. Normal positioning of the duodenum and ligament of Treitz. Normal small bowel without wall thickening, inflammation, or obstruction. The appendix is surgically absent. The terminal ileum is normal. Cecum is slightly high-riding in the mid abdomen. Moderate volume of stool throughout the colon. Tortuosity of the transverse, descending, and sigmoid colon. No colonic wall thickening or pericolonic edema. No evidence of colonic mass. No significant diverticular disease. Vascular/Lymphatic: Mild to moderate aorto bi-iliac atherosclerosis. No aortic  aneurysm. Patent portal vein. No acute vascular findings. No enlarged lymph nodes in the abdomen or pelvis. Reproductive: Normal CT appearance of the uterus. Quiescent ovaries without adnexal mass. Other: No ascites. Small fat containing umbilical hernia. No evidence of intra-abdominal mass. Musculoskeletal: Punctate sclerotic density in L1 is unchanged from prior consistent with bone island. There are no acute or suspicious osseous abnormalities. IMPRESSION: 1. Flaring of the distal common bile duct to 8 mm. There is no intrahepatic biliary ductal dilatation or abnormal gallbladder distension. Recommend correlation with LFTs. If elevated, recommend further evaluation with MRCP. 2. Fluid within the distal esophagus, can be seen with reflux or esophageal dysmotility. 3. Mild hepatic steatosis. 4. Moderate colonic stool burden with colonic tortuosity, can be seen with constipation. Aortic Atherosclerosis (ICD10-I70.0). Electronically Signed   By: Keith Rake M.D.   On: 10/09/2020 12:40    Assessment & Plan:   Adilenne was seen today for hypertension.  Diagnoses and all orders for this visit:  Encounter for long-term opiate analgesic use -     Urine drugs of abuse scrn w alc, routine (Ref Lab); Future -     Methadone (GC/LC/MS), urine; Future -     Methadone (GC/LC/MS), urine -     Urine drugs of abuse scrn w alc, routine (Ref Lab)  Long-term current use of opiate analgesic- I will monitor for compliance with methadone and screen for substance abuse. -     Urine drugs of abuse scrn w alc, routine (Ref Lab); Future -     Methadone (GC/LC/MS), urine; Future -     Methadone (GC/LC/MS), urine -     Urine drugs of abuse scrn w alc, routine (Ref Lab)  Essential hypertension- Her blood pressure is not adequately well controlled.  I recommended that she try to take the Dyazide as directed.  Will switch carvedilol to once daily dosing.  Will continue the current dose of the ARB and clonidine. -      Urine drugs of abuse scrn w alc, routine (Ref Lab); Future -     triamterene-hydrochlorothiazide (DYAZIDE) 37.5-25 MG capsule; Take 1 each (1 capsule total) by mouth daily. -     Basic metabolic panel; Future -     carvedilol (COREG CR) 40 MG 24 hr capsule; Take 1 capsule (40 mg total) by mouth daily. -     cloNIDine (CATAPRES - DOSED IN MG/24 HR) 0.2 mg/24hr patch; Place  1 patch (0.2 mg total) onto the skin once a week. -     Basic metabolic panel -     Urine drugs of abuse scrn w alc, routine (Ref Lab)  Chronic pain syndrome -     methadone (DOLOPHINE) 10 MG tablet; Take 2 tablets (20 mg total) by mouth every 8 (eight) hours as needed.  Cervical spinal stenosis -     methadone (DOLOPHINE) 10 MG tablet; Take 2 tablets (20 mg total) by mouth every 8 (eight) hours as needed.  Visit for screening mammogram -     MM DIGITAL SCREENING BILATERAL; Future  Stage 3a chronic kidney disease ([place])- She has had a slight decline in her renal function.  I recommended that she stop taking anti-inflammatories and try to get better control of her blood pressure.  COPD (chronic obstructive pulmonary disease) with chronic bronchitis (H[facility]) -     Budeson-Glycopyrrol-Formoterol (BREZTRI AEROSPHERE) 160-9-4.8 MCG/ACT AERO; Inhale 2 puffs into the lungs in the morning and at bedtime.  I have discontinued Erik Obey Recinos's Vitamin D (Ergocalciferol) and carvedilol. I have also changed her cloNIDine. Additionally, I am having her start on carvedilol. Lastly, I am having her maintain her cyanocobalamin, Fish Oil, atorvastatin, olmesartan, folic acid, Cholecalciferol, triamterene-hydrochlorothiazide, methadone, and Breztri Aerosphere.  Meds ordered this encounter  Medications   triamterene-hydrochlorothiazide (DYAZIDE) 37.5-25 MG capsule    Sig: Take 1 each (1 capsule total) by mouth daily.    Dispense:  90 capsule    Refill:  0   carvedilol (COREG CR) 40 MG 24 hr capsule    Sig: Take 1 capsule (40 mg total) by  mouth daily.    Dispense:  90 capsule    Refill:  1   cloNIDine (CATAPRES - DOSED IN MG/24 HR) 0.2 mg/24hr patch    Sig: Place 1 patch (0.2 mg total) onto the skin once a week.    Dispense:  12 patch    Refill:  1   methadone (DOLOPHINE) 10 MG tablet    Sig: Take 2 tablets (20 mg total) by mouth every 8 (eight) hours as needed.    Dispense:  180 tablet    Refill:  0   Budeson-Glycopyrrol-Formoterol (BREZTRI AEROSPHERE) 160-9-4.8 MCG/ACT AERO    Sig: Inhale 2 puffs into the lungs in the morning and at bedtime.    Dispense:  32.1 g    Refill:  1     Follow-up: Return in about 3 months (around 04/16/2021).  Scarlette Calico, MD

## 2021-01-14 NOTE — Patient Instructions (Signed)

## 2021-01-15 MED ORDER — BREZTRI AEROSPHERE 160-9-4.8 MCG/ACT IN AERO: 2.0000 | INHALATION_SPRAY | Freq: Two times a day (BID) | RESPIRATORY_TRACT | 1 refills | Status: DC

## 2021-01-19 ENCOUNTER — Ambulatory Visit: Payer: 59 | Admitting: Podiatry

## 2021-01-19 ENCOUNTER — Ambulatory Visit: Payer: 59 | Admitting: Internal Medicine

## 2021-01-20 ENCOUNTER — Telehealth: Payer: Self-pay

## 2021-01-20 NOTE — Telephone Encounter (Signed)
Key: [id number]

## 2021-01-21 ENCOUNTER — Other Ambulatory Visit: Payer: Self-pay | Admitting: Internal Medicine

## 2021-01-21 DIAGNOSIS — J449 Chronic obstructive pulmonary disease, unspecified: Secondary | ICD-10-CM

## 2021-01-21 MED ORDER — TRELEGY ELLIPTA 100-62.5-25 MCG/INH IN AEPB: 1.0000 | INHALATION_SPRAY | Freq: Every day | RESPIRATORY_TRACT | 1 refills | Status: DC

## 2021-01-21 NOTE — Addendum Note (Signed)
Addended by: Janith Lima on: 01/21/2021 08:30 AM   Modules accepted: Orders

## 2021-01-21 NOTE — Telephone Encounter (Signed)
FYI Per CoverMyMeds [place] was denied

## 2021-01-23 LAB — URINE DRUGS OF ABUSE SCREEN W ALC, ROUTINE (REF LAB)
Amphetamines, Urine: NEGATIVE ng/mL
Barbiturate Quant, Ur: NEGATIVE ng/mL
Benzodiazepine Quant, Ur: NEGATIVE ng/mL
Cannabinoid Quant, Ur: NEGATIVE ng/mL
Cocaine (Metab.): NEGATIVE ng/mL
Ethanol, Urine: NEGATIVE %
Opiate Quant, Ur: NEGATIVE ng/mL
PCP Quant, Ur: NEGATIVE ng/mL
Propoxyphene: NEGATIVE ng/mL

## 2021-01-23 LAB — METHADONE CONFIRMATION, URINE
Methadone GC/MS Conf: 2330 ng/mL
Methadone: POSITIVE — AB

## 2021-01-23 LAB — SPECIMEN STATUS REPORT

## 2021-02-10 ENCOUNTER — Other Ambulatory Visit: Payer: Self-pay | Admitting: Internal Medicine

## 2021-02-10 DIAGNOSIS — G894 Chronic pain syndrome: Secondary | ICD-10-CM

## 2021-02-10 DIAGNOSIS — M4802 Spinal stenosis, cervical region: Secondary | ICD-10-CM

## 2021-02-10 MED ORDER — METHADONE HCL 10 MG PO TABS: 20.0000 mg | ORAL_TABLET | Freq: Three times a day (TID) | ORAL | 0 refills | Status: DC | PRN

## 2021-03-10 ENCOUNTER — Encounter: Payer: Self-pay | Admitting: Internal Medicine

## 2021-03-10 ENCOUNTER — Other Ambulatory Visit: Payer: Self-pay | Admitting: Internal Medicine

## 2021-03-10 DIAGNOSIS — G894 Chronic pain syndrome: Secondary | ICD-10-CM

## 2021-03-10 DIAGNOSIS — M4802 Spinal stenosis, cervical region: Secondary | ICD-10-CM

## 2021-03-11 ENCOUNTER — Other Ambulatory Visit: Payer: Self-pay | Admitting: Internal Medicine

## 2021-03-11 ENCOUNTER — Encounter: Payer: Self-pay | Admitting: Internal Medicine

## 2021-03-11 DIAGNOSIS — G894 Chronic pain syndrome: Secondary | ICD-10-CM

## 2021-03-11 DIAGNOSIS — M4802 Spinal stenosis, cervical region: Secondary | ICD-10-CM

## 2021-03-11 MED ORDER — METHADONE HCL 10 MG PO TABS: 20.0000 mg | ORAL_TABLET | Freq: Three times a day (TID) | ORAL | 0 refills | Status: DC | PRN

## 2021-03-14 ENCOUNTER — Other Ambulatory Visit: Payer: Self-pay | Admitting: Internal Medicine

## 2021-03-14 DIAGNOSIS — Z720 Tobacco use: Secondary | ICD-10-CM

## 2021-03-14 DIAGNOSIS — Z1231 Encounter for screening mammogram for malignant neoplasm of breast: Secondary | ICD-10-CM

## 2021-03-25 ENCOUNTER — Telehealth: Payer: Self-pay | Admitting: Internal Medicine

## 2021-03-25 NOTE — Telephone Encounter (Signed)
Maddie from [facility] called in about referral for patient  Says referral order needs different diagnose code for insurance  Callback [phone]

## 2021-04-02 NOTE — Telephone Encounter (Signed)
Called pt, LVM.   

## 2021-04-06 ENCOUNTER — Encounter: Payer: Self-pay | Admitting: Endocrinology

## 2021-04-07 ENCOUNTER — Encounter: Payer: Self-pay | Admitting: Internal Medicine

## 2021-04-08 ENCOUNTER — Other Ambulatory Visit: Payer: Self-pay | Admitting: Internal Medicine

## 2021-04-08 ENCOUNTER — Ambulatory Visit: Payer: Medicaid Other | Admitting: Endocrinology

## 2021-04-08 DIAGNOSIS — G894 Chronic pain syndrome: Secondary | ICD-10-CM

## 2021-04-08 DIAGNOSIS — M4802 Spinal stenosis, cervical region: Secondary | ICD-10-CM

## 2021-04-09 ENCOUNTER — Ambulatory Visit: Payer: 59 | Admitting: Internal Medicine

## 2021-04-09 ENCOUNTER — Other Ambulatory Visit: Payer: Self-pay | Admitting: Internal Medicine

## 2021-04-09 DIAGNOSIS — M4802 Spinal stenosis, cervical region: Secondary | ICD-10-CM

## 2021-04-09 DIAGNOSIS — G894 Chronic pain syndrome: Secondary | ICD-10-CM

## 2021-04-09 MED ORDER — METHADONE HCL 10 MG PO TABS: 20.0000 mg | ORAL_TABLET | Freq: Three times a day (TID) | ORAL | 0 refills | Status: DC | PRN

## 2021-04-16 ENCOUNTER — Other Ambulatory Visit: Payer: Self-pay | Admitting: Internal Medicine

## 2021-04-16 DIAGNOSIS — I10 Essential (primary) hypertension: Secondary | ICD-10-CM

## 2021-04-16 DIAGNOSIS — D520 Dietary folate deficiency anemia: Secondary | ICD-10-CM

## 2021-04-20 ENCOUNTER — Other Ambulatory Visit: Payer: Self-pay | Admitting: Internal Medicine

## 2021-04-20 DIAGNOSIS — I10 Essential (primary) hypertension: Secondary | ICD-10-CM

## 2021-04-20 MED ORDER — TRIAMTERENE-HCTZ 37.5-25 MG PO CAPS: 1.0000 | ORAL_CAPSULE | Freq: Every day | ORAL | 0 refills | Status: DC

## 2021-04-28 ENCOUNTER — Telehealth: Payer: Self-pay | Admitting: Internal Medicine

## 2021-04-28 NOTE — Telephone Encounter (Signed)
Type of form received: Surgical clearance  Form placed in: Provider mailbox  Additional instructions from the patient: fax  Things to remember: [place] office: If form received in person, remind patient that forms take 7-10 business days CMA should attach charge sheet and put on Supervisor's desk

## 2021-04-29 ENCOUNTER — Telehealth: Payer: Self-pay

## 2021-04-29 ENCOUNTER — Encounter: Payer: Self-pay | Admitting: Internal Medicine

## 2021-04-29 NOTE — Telephone Encounter (Signed)
Incoming call to the office from the dentist office to clarify if patient could have a dental procedure done and if the patient has be prescribed and blood pressure medication.   Per Dr.Jones he has advice that the procedure not be done until the patient B/P is less than 160/90. Patient is already taking 5 B/P medication. Patient has an upcoming appt on 05/06/21.

## 2021-04-29 NOTE — Telephone Encounter (Signed)
Incoming call to the office from the dentist office to clarify if patient could have a dental procedure done and if the patient has be prescribed and blood pressure medication.  Per Dr.Jones he has advice that the procedure not be done until the patient B/P is less than 160/90. Patient is already taking 5 B/P medication. Patient has an upcoming appt on 05/06/21.

## 2021-05-06 ENCOUNTER — Other Ambulatory Visit: Payer: Self-pay | Admitting: Internal Medicine

## 2021-05-06 ENCOUNTER — Encounter: Payer: Self-pay | Admitting: Internal Medicine

## 2021-05-06 ENCOUNTER — Ambulatory Visit: Payer: 59 | Admitting: Internal Medicine

## 2021-05-06 DIAGNOSIS — G894 Chronic pain syndrome: Secondary | ICD-10-CM

## 2021-05-06 DIAGNOSIS — M4802 Spinal stenosis, cervical region: Secondary | ICD-10-CM

## 2021-05-06 LAB — BASIC METABOLIC PANEL
BUN: 10 (ref 4–21)
CO2: 28 — AB (ref 13–22)
Chloride: 103 (ref 99–108)
Creatinine: 1.1 (ref 0.5–1.1)
Glucose: 125
Potassium: 3.4 (ref 3.4–5.3)
Sodium: 137 (ref 137–147)

## 2021-05-06 LAB — CBC AND DIFFERENTIAL
HCT: 47 — AB (ref 36–46)
Hemoglobin: 15.3 (ref 12.0–16.0)
Platelets: 263 (ref 150–399)
WBC: 13.1

## 2021-05-06 LAB — TSH: TSH: 1.67 (ref 0.41–5.90)

## 2021-05-06 LAB — COMPREHENSIVE METABOLIC PANEL: Calcium: 10.4 (ref 8.7–10.7)

## 2021-05-07 ENCOUNTER — Other Ambulatory Visit: Payer: Self-pay | Admitting: Internal Medicine

## 2021-05-07 ENCOUNTER — Encounter: Payer: Self-pay | Admitting: Internal Medicine

## 2021-05-07 DIAGNOSIS — I10 Essential (primary) hypertension: Secondary | ICD-10-CM

## 2021-05-07 DIAGNOSIS — M4802 Spinal stenosis, cervical region: Secondary | ICD-10-CM

## 2021-05-07 DIAGNOSIS — G894 Chronic pain syndrome: Secondary | ICD-10-CM

## 2021-05-07 MED ORDER — METHADONE HCL 5 MG PO TABS: 5.0000 mg | ORAL_TABLET | Freq: Three times a day (TID) | ORAL | 0 refills | Status: DC

## 2021-05-07 MED ORDER — METHADONE HCL 10 MG PO TABS
10.0000 mg | ORAL_TABLET | Freq: Three times a day (TID) | ORAL | 0 refills | Status: DC
Start: 2021-05-07 — End: 2021-06-03

## 2021-05-08 ENCOUNTER — Other Ambulatory Visit: Payer: Self-pay | Admitting: Internal Medicine

## 2021-05-08 DIAGNOSIS — I10 Essential (primary) hypertension: Secondary | ICD-10-CM

## 2021-05-08 MED ORDER — CARVEDILOL 12.5 MG PO TABS: 12.5000 mg | ORAL_TABLET | Freq: Two times a day (BID) | ORAL | 0 refills | Status: DC

## 2021-05-12 ENCOUNTER — Ambulatory Visit: Payer: 59 | Admitting: Internal Medicine

## 2021-05-19 ENCOUNTER — Ambulatory Visit: Payer: 59 | Admitting: Internal Medicine

## 2021-05-21 ENCOUNTER — Other Ambulatory Visit: Payer: Self-pay | Admitting: Internal Medicine

## 2021-05-21 ENCOUNTER — Ambulatory Visit (INDEPENDENT_AMBULATORY_CARE_PROVIDER_SITE_OTHER): Payer: 59 | Admitting: Internal Medicine

## 2021-05-21 ENCOUNTER — Encounter: Payer: Self-pay | Admitting: Internal Medicine

## 2021-05-21 ENCOUNTER — Other Ambulatory Visit: Payer: Self-pay

## 2021-05-21 VITALS — BP 160/90 | HR 73 | Temp 98.4°F | Resp 16 | Ht 60.0 in | Wt 155.6 lb

## 2021-05-21 DIAGNOSIS — Z23 Encounter for immunization: Secondary | ICD-10-CM | POA: Diagnosis not present

## 2021-05-21 DIAGNOSIS — N1831 Chronic kidney disease, stage 3a: Secondary | ICD-10-CM | POA: Diagnosis not present

## 2021-05-21 DIAGNOSIS — I872 Venous insufficiency (chronic) (peripheral): Secondary | ICD-10-CM

## 2021-05-21 DIAGNOSIS — I10 Essential (primary) hypertension: Secondary | ICD-10-CM | POA: Diagnosis not present

## 2021-05-21 MED ORDER — SHINGRIX 50 MCG/0.5ML IM SUSR: 0.5000 mL | Freq: Once | INTRAMUSCULAR | 1 refills | Status: AC

## 2021-05-21 MED ORDER — OLMESARTAN MEDOXOMIL 40 MG PO TABS: 40.0000 mg | ORAL_TABLET | Freq: Every day | ORAL | 0 refills | Status: DC

## 2021-05-21 NOTE — Progress Notes (Signed)
Subjective:  Patient ID: Emily [name], female    DOB: 07/17/1966  Age: 55 y.o. MRN: [id number]  CC: Hypertension  This visit occurred during the SARS-CoV-2 public health emergency.  Safety protocols were in place, including screening questions prior to the visit, additional usage of staff PPE, and extensive cleaning of exam room while observing appropriate contact time as indicated for disinfecting solutions.    HPI Emily [name] presents for f/up -  She was recently admitted elsewhere for hypertensive emergency.  She has been more compliant with her antihypertensives and her blood pressure is much better.  According to prescription refills she has no longer taking the ARB.  She continues to have neck pain and says the pain is adequately well controlled with methadone.  Outpatient Medications Prior to Visit  Medication Sig Dispense Refill   atorvastatin (LIPITOR) 10 MG tablet Take 1 tablet (10 mg total) by mouth daily. 90 tablet 1   carvedilol (COREG) 12.5 MG tablet Take 1 tablet (12.5 mg total) by mouth 2 (two) times daily with a meal. 180 tablet 0   Cholecalciferol 50 MCG (2000 UT) TABS Take 1 tablet (2,000 Units total) by mouth daily. 90 tablet 1   cloNIDine (CATAPRES - DOSED IN MG/24 HR) 0.2 mg/24hr patch Place 1 patch (0.2 mg total) onto the skin once a week. 12 patch 1   cyanocobalamin 2000 MCG tablet Take 1 tablet (2,000 mcg total) by mouth daily. 90 tablet 1   folic acid (FOLVITE) 1 MG tablet TAKE 1 TABLET(1 MG) BY MOUTH DAILY 90 tablet 0   methadone (DOLOPHINE) 10 MG tablet Take 1 tablet (10 mg total) by mouth every 8 (eight) hours. 90 tablet 0   methadone (DOLOPHINE) 5 MG tablet Take 1 tablet (5 mg total) by mouth every 8 (eight) hours. 90 tablet 0   Omega-3 Fatty Acids (FISH OIL) 1000 MG CAPS Take 2 capsules by mouth every evening.     TRELEGY ELLIPTA 100-62.5-25 MCG/INH AEPB Inhale 1 puff into the lungs daily. 3 each 1   triamterene-hydrochlorothiazide (DYAZIDE) 37.5-25  MG capsule Take 1 each (1 capsule total) by mouth daily. 90 capsule 0   carvedilol (COREG CR) 40 MG 24 hr capsule Take 1 capsule (40 mg total) by mouth daily. 90 capsule 1   olmesartan (BENICAR) 40 MG tablet Take 1 tablet (40 mg total) by mouth daily. 90 tablet 1   No facility-administered medications prior to visit.    ROS Review of Systems  Constitutional:  Negative for diaphoresis and fatigue.  HENT: Negative.    Eyes: Negative.   Respiratory:  Negative for chest tightness, shortness of breath and wheezing.   Cardiovascular:  Negative for chest pain, palpitations and leg swelling.  Gastrointestinal:  Negative for abdominal pain, constipation, diarrhea, nausea and vomiting.  Endocrine: Negative.   Genitourinary: Negative.  Negative for difficulty urinating and hematuria.  Musculoskeletal:  Positive for neck pain. Negative for back pain.  Skin: Negative.  Negative for color change and pallor.  Neurological:  Negative for dizziness, weakness, light-headedness and headaches.  Hematological:  Negative for adenopathy. Does not bruise/bleed easily.  Psychiatric/Behavioral: Negative.     Objective:  BP (!) 160/90 (BP Location: Left Arm, Patient Position: Sitting, Cuff Size: Large)   Pulse 73   Temp 98.4 F (36.9 C) (Oral)   Resp 16   Ht 5' (1.524 m)   Wt 155 lb 9.6 oz (70.6 kg)   SpO2 97%   BMI 30.39 kg/m   BP Readings  from Last 3 Encounters:  05/21/21 (!) 160/90  01/14/21 (!) 172/116  10/09/20 (!) 180/120    Wt Readings from Last 3 Encounters:  05/21/21 155 lb 9.6 oz (70.6 kg)  01/14/21 156 lb (70.8 kg)  10/09/20 152 lb (68.9 kg)    Physical Exam Vitals reviewed.  HENT:     Nose: Nose normal.     Mouth/Throat:     Mouth: Mucous membranes are moist.  Eyes:     Conjunctiva/sclera: Conjunctivae normal.  Cardiovascular:     Rate and Rhythm: Normal rate and regular rhythm.     Heart sounds: No murmur heard. Pulmonary:     Effort: Pulmonary effort is normal.      Breath sounds: No stridor. No wheezing, rhonchi or rales.  Abdominal:     General: Abdomen is flat.     Palpations: There is no mass.     Tenderness: There is no abdominal tenderness. There is no guarding.     Hernia: No hernia is present.  Musculoskeletal:        General: Normal range of motion.     Cervical back: Neck supple.     Right lower leg: No edema.     Left lower leg: No edema.  Lymphadenopathy:     Cervical: No cervical adenopathy.  Skin:    General: Skin is warm and dry.  Neurological:     General: No focal deficit present.     Mental Status: She is alert.  Psychiatric:        Mood and Affect: Mood normal.        Behavior: Behavior normal.    Lab Results  Component Value Date   WBC 13.1 05/06/2021   HGB 15.3 05/06/2021   HCT 47 (A) 05/06/2021   PLT 263 05/06/2021   GLUCOSE 99 01/14/2021   CHOL 167 10/09/2020   TRIG 229.0 (H) 10/09/2020   HDL 28.50 (L) 10/09/2020   LDLDIRECT 95.0 10/09/2020   LDLCALC 92 10/31/2017   ALT 8 10/09/2020   AST 20 10/09/2020   NA 137 05/06/2021   K 3.4 05/06/2021   CL 103 05/06/2021   CREATININE 1.1 05/06/2021   BUN 10 05/06/2021   CO2 28 (A) 05/06/2021   TSH 1.67 05/06/2021   INR 1.1 (H) 10/09/2020   HGBA1C 5.9 10/09/2020    CT Abdomen Pelvis W Contrast  Result Date: 10/09/2020 CLINICAL DATA:  Unintended weight loss. 10 pound weight loss in 6 months. EXAM: CT ABDOMEN AND PELVIS WITH CONTRAST TECHNIQUE: Multidetector CT imaging of the abdomen and pelvis was performed using the standard protocol following bolus administration of intravenous contrast. Creatinine was obtained on site at [facility] at [address] Results: Creatinine 1.0 mg/dL. CONTRAST:  161m ISOVUE-300 IOPAMIDOL (ISOVUE-300) INJECTION 61% COMPARISON:  Renal ultrasound 08/15/2017. Abdominopelvic CT 04/24/2008 FINDINGS: Lower chest: Mild atelectasis or scarring in the lingula. No basilar airspace disease or pulmonary nodule. No pleural fluid. Heart  is normal in size. There is fluid within the distal esophagus. Hepatobiliary: Mild decreased hepatic density typical of steatosis. No focal liver lesion. Gallbladder physiologically distended, no calcified stone. Gallstone on prior renal ultrasound is not seen by CT. There is flaring of the distal common bile duct at 8 mm. No intrahepatic biliary ductal dilatation. Pancreas: No ductal dilatation or inflammation. No evidence of pancreatic mass. Spleen: Normal in size without focal abnormality. Adrenals/Urinary Tract: Normal adrenal glands. No hydronephrosis or perinephric edema. Homogeneous renal enhancement with symmetric excretion on delayed phase imaging. No  evidence of renal stone or focal lesion. Urinary bladder is partially distended without wall thickening. Stomach/Bowel: Fluid within the distal esophagus without wall thickening. The stomach is unremarkable. Normal positioning of the duodenum and ligament of Treitz. Normal small bowel without wall thickening, inflammation, or obstruction. The appendix is surgically absent. The terminal ileum is normal. Cecum is slightly high-riding in the mid abdomen. Moderate volume of stool throughout the colon. Tortuosity of the transverse, descending, and sigmoid colon. No colonic wall thickening or pericolonic edema. No evidence of colonic mass. No significant diverticular disease. Vascular/Lymphatic: Mild to moderate aorto bi-iliac atherosclerosis. No aortic aneurysm. Patent portal vein. No acute vascular findings. No enlarged lymph nodes in the abdomen or pelvis. Reproductive: Normal CT appearance of the uterus. Quiescent ovaries without adnexal mass. Other: No ascites. Small fat containing umbilical hernia. No evidence of intra-abdominal mass. Musculoskeletal: Punctate sclerotic density in L1 is unchanged from prior consistent with bone island. There are no acute or suspicious osseous abnormalities. IMPRESSION: 1. Flaring of the distal common bile duct to 8 mm. There  is no intrahepatic biliary ductal dilatation or abnormal gallbladder distension. Recommend correlation with LFTs. If elevated, recommend further evaluation with MRCP. 2. Fluid within the distal esophagus, can be seen with reflux or esophageal dysmotility. 3. Mild hepatic steatosis. 4. Moderate colonic stool burden with colonic tortuosity, can be seen with constipation. Aortic Atherosclerosis (ICD10-I70.0). Electronically Signed   By: Keith Rake M.D.   On: 10/09/2020 12:40   Zola Button, MD - 05/06/2021  Formatting of this note might be different from the original.   CHEST, TWO VIEWS:   COMPARISON: 05/06/2021   No demonstrated pneumothorax or pleural effusion.   Lung consolidation is not evident.   Heart size and upper mediastinum are unremarkable.   Acute bony lesions are not evident.    CONCLUSION:   NO RADIOGRAPHIC EVIDENCE OF ACTIVE CARDIOPULMONARY DISEASE.     Assessment & Plan:   Kemi was seen today for hypertension.  Diagnoses and all orders for this visit:  Essential hypertension- She has not achieved her blood pressure goal of 130/80.  Will restart the ARB and continue the other antihypertensives. -     olmesartan (BENICAR) 40 MG tablet; Take 1 tablet (40 mg total) by mouth daily.  Venous (peripheral) insufficiency  Stage 3a chronic kidney disease ([place])- Her renal function is stable.  Need for shingles vaccine -     Zoster Vaccine Adjuvanted [facility]) injection; Inject 0.5 mLs into the muscle once for 1 dose.  Other orders -     Flu Vaccine QUAD 6+ mos PF IM (Fluarix Quad PF)  I am having Marygrace Drought start on Shingrix. I am also having her maintain her cyanocobalamin, Fish Oil, atorvastatin, Cholecalciferol, cloNIDine, Trelegy Ellipta, folic acid, triamterene-hydrochlorothiazide, methadone, methadone, carvedilol, and olmesartan.  Meds ordered this encounter  Medications   olmesartan (BENICAR) 40 MG tablet    Sig: Take 1 tablet (40 mg  total) by mouth daily.    Dispense:  90 tablet    Refill:  0   Zoster Vaccine Adjuvanted [facility]) injection    Sig: Inject 0.5 mLs into the muscle once for 1 dose.    Dispense:  0.5 mL    Refill:  1      Follow-up: Return in about 3 months (around 08/21/2021).  Scarlette Calico, MD

## 2021-05-21 NOTE — Patient Instructions (Signed)

## 2021-05-28 ENCOUNTER — Ambulatory Visit: Payer: 59 | Admitting: Endocrinology

## 2021-06-03 ENCOUNTER — Other Ambulatory Visit: Payer: Self-pay | Admitting: Internal Medicine

## 2021-06-03 DIAGNOSIS — M4802 Spinal stenosis, cervical region: Secondary | ICD-10-CM

## 2021-06-03 DIAGNOSIS — G894 Chronic pain syndrome: Secondary | ICD-10-CM

## 2021-06-04 ENCOUNTER — Encounter: Payer: Self-pay | Admitting: Internal Medicine

## 2021-06-04 MED ORDER — METHADONE HCL 10 MG PO TABS: 10.0000 mg | ORAL_TABLET | Freq: Three times a day (TID) | ORAL | 0 refills | Status: DC

## 2021-06-04 MED ORDER — METHADONE HCL 5 MG PO TABS: 5.0000 mg | ORAL_TABLET | Freq: Three times a day (TID) | ORAL | 0 refills | Status: DC

## 2021-06-09 ENCOUNTER — Ambulatory Visit: Payer: 59 | Admitting: Internal Medicine

## 2021-06-11 ENCOUNTER — Ambulatory Visit: Payer: 59 | Admitting: Internal Medicine

## 2021-06-12 ENCOUNTER — Encounter: Payer: Self-pay | Admitting: Endocrinology

## 2021-06-12 ENCOUNTER — Ambulatory Visit (INDEPENDENT_AMBULATORY_CARE_PROVIDER_SITE_OTHER): Payer: 59 | Admitting: Endocrinology

## 2021-06-12 ENCOUNTER — Other Ambulatory Visit: Payer: Self-pay

## 2021-06-12 VITALS — BP 180/120 | HR 62 | Ht 60.0 in | Wt 155.0 lb

## 2021-06-12 DIAGNOSIS — R7989 Other specified abnormal findings of blood chemistry: Secondary | ICD-10-CM

## 2021-06-12 MED ORDER — DEXAMETHASONE 1 MG PO TABS: ORAL_TABLET | ORAL | 0 refills | Status: DC

## 2021-06-12 NOTE — Progress Notes (Signed)
Subjective:    Patient ID: Emily [name], female    DOB: [birth year], 55 y.o.   MRN: [id number]  HPI Pt is referred by Dr Ronnald Ramp, for elev cortisol level.  This was dx'ed 2022.  No recent steroid rx.  She has no h/o cancer, pituitary disorder, cataracts, PUD, osteoporosis, DM, infection, or adrenal disorder.  Only bony fracture was right ankle 1984--traumatic.  She reports easy bruising and intermitt HA. Past Medical History:  Diagnosis Date   Allergic rhinitis, cause unspecified    Chronic pain syndrome    Fibromyalgia    Goiter, unspecified    Lumbago    Spinal stenosis, unspecified region other than cervical    Unspecified essential hypertension    Urinary tract infection, site not specified     Past Surgical History:  Procedure Laterality Date   APPENDECTOMY  9-09   Laproscopic- Dr Marlou Starks   CESAREAN SECTION     X 2   CRANIECTOMY     LAMINECTOMY     URETHRAL DILATION  55 yrs old    Social History   Socioeconomic History   Marital status: Married    Spouse name: Not on file   Number of children: Not on file   Years of education: Not on file   Highest education level: Not on file  Occupational History   Not on file  Tobacco Use   Smoking status: Every Day   Smokeless tobacco: Never  Substance and Sexual Activity   Alcohol use: No   Drug use: No   Sexual activity: Yes  Other Topics Concern   Not on file  Social History Narrative   Not on file   Social Determinants of Health   Financial Resource Strain: Not on file  Food Insecurity: Not on file  Transportation Needs: Not on file  Physical Activity: Not on file  Stress: Not on file  Social Connections: Not on file  Intimate Partner Violence: Not on file    Current Outpatient Medications on File Prior to Visit  Medication Sig Dispense Refill   atorvastatin (LIPITOR) 10 MG tablet Take 1 tablet (10 mg total) by mouth daily. 90 tablet 1   carvedilol (COREG) 12.5 MG tablet Take 1 tablet (12.5 mg total) by  mouth 2 (two) times daily with a meal. 180 tablet 0   Cholecalciferol 50 MCG (2000 UT) TABS Take 1 tablet (2,000 Units total) by mouth daily. 90 tablet 1   cloNIDine (CATAPRES - DOSED IN MG/24 HR) 0.2 mg/24hr patch Place 1 patch (0.2 mg total) onto the skin once a week. 12 patch 1   cyanocobalamin 2000 MCG tablet Take 1 tablet (2,000 mcg total) by mouth daily. 90 tablet 1   folic acid (FOLVITE) 1 MG tablet TAKE 1 TABLET(1 MG) BY MOUTH DAILY 90 tablet 0   methadone (DOLOPHINE) 10 MG tablet Take 1 tablet (10 mg total) by mouth every 8 (eight) hours. 90 tablet 0   methadone (DOLOPHINE) 5 MG tablet Take 1 tablet (5 mg total) by mouth every 8 (eight) hours. 90 tablet 0   olmesartan (BENICAR) 40 MG tablet Take 1 tablet (40 mg total) by mouth daily. 90 tablet 0   Omega-3 Fatty Acids (FISH OIL) 1000 MG CAPS Take 2 capsules by mouth every evening.     TRELEGY ELLIPTA 100-62.5-25 MCG/INH AEPB Inhale 1 puff into the lungs daily. 3 each 1   triamterene-hydrochlorothiazide (DYAZIDE) 37.5-25 MG capsule Take 1 each (1 capsule total) by mouth daily. 90 capsule 0  No current facility-administered medications on file prior to visit.    No Known Allergies  Family History  Problem Relation Age of Onset   Hyperlipidemia Mother    Hypertension Mother    Hyperlipidemia Father    Hypertension Father    Coronary artery disease Father    Heart attack Father    Cancer Neg Hx        colon, breast   Diabetes Neg Hx    Adrenal disorder Neg Hx    Other Neg Hx     BP (!) 180/120 (BP Location: Right Arm, Patient Position: Sitting, Cuff Size: Large)   Pulse 62   Ht 5' (1.524 m)   Wt 155 lb (70.3 kg)   SpO2 98%   BMI 30.27 kg/m    Review of Systems denies hirsutism, muscle weakness, and depression.    Objective:   Physical Exam VS: see vs page GEN: no distress HEAD: head: no deformity eyes: no periorbital swelling, no proptosis external nose and ears are normal NECK: supple, thyroid is not  enlarged CHEST WALL: no deformity LUNGS: clear to auscultation CV: reg rate and rhythm, no murmur.  MUSCULOSKELETAL: gait is normal and steady EXTEMITIES: no deformity.  no leg edema.  NEURO:  readily moves all 4's.  sensation is intact to touch on all 4's SKIN:  Normal texture and temperature.  No rash or suspicious lesion is visible.  No striae.  No ecchymoses.    NODES:  None palpable at the neck PSYCH: alert, well-oriented.  Does not appear anxious nor depressed.    outside test results are reviewed: CT abd (2022): normal adrenals.   CT head (2021, non-contrast) no mention is made of the pituitary.    PM ACTH: 31 PM cortisol: 21  I have reviewed outside records, and summarized:  Pt was noted to have elevated cortisol, and referred here.  She has severe HTN.  Pain is controlled with methadone.       Assessment & Plan:  Hypercortisolemia, new to me, uncertain etiology and prognosis   Patient Instructions  Please check a 24-HR urine collection.  Here is a container.  After 1 day in between, You should do a "dexamethasone suppression test."  For this, you would take dexamethasone 1 mg at 10 pm (I have sent a prescription to your pharmacy), then come in for a "cortisol" blood test the next morning before 9 am.  You do not need to be fasting for this test.   [facility] at [facility], [address], [place],  [id number].

## 2021-06-12 NOTE — Patient Instructions (Addendum)
Please check a 24-HR urine collection.  Here is a container.  After 1 day in between, You should do a "dexamethasone suppression test."  For this, you would take dexamethasone 1 mg at 10 pm (I have sent a prescription to your pharmacy), then come in for a "cortisol" blood test the next morning before 9 am.  You do not need to be fasting for this test.   [facility] is at [facility], [address], [place],  [address].

## 2021-06-13 ENCOUNTER — Encounter: Payer: Self-pay | Admitting: Endocrinology

## 2021-06-17 ENCOUNTER — Ambulatory Visit: Payer: 59 | Admitting: Internal Medicine

## 2021-06-18 ENCOUNTER — Ambulatory Visit: Payer: 59 | Admitting: Internal Medicine

## 2021-06-23 ENCOUNTER — Encounter: Payer: Self-pay | Admitting: Endocrinology

## 2021-06-26 ENCOUNTER — Other Ambulatory Visit: Payer: Self-pay

## 2021-06-26 ENCOUNTER — Encounter: Payer: Self-pay | Admitting: Endocrinology

## 2021-06-26 DIAGNOSIS — R7989 Other specified abnormal findings of blood chemistry: Secondary | ICD-10-CM

## 2021-06-26 NOTE — Telephone Encounter (Signed)
Spoke with Emily [name] and she stated that this has been faxed

## 2021-06-30 ENCOUNTER — Other Ambulatory Visit: Payer: Self-pay | Admitting: Internal Medicine

## 2021-06-30 DIAGNOSIS — G894 Chronic pain syndrome: Secondary | ICD-10-CM

## 2021-06-30 DIAGNOSIS — M4802 Spinal stenosis, cervical region: Secondary | ICD-10-CM

## 2021-07-01 ENCOUNTER — Encounter: Payer: Self-pay | Admitting: Internal Medicine

## 2021-07-01 MED ORDER — METHADONE HCL 10 MG PO TABS: 10.0000 mg | ORAL_TABLET | Freq: Three times a day (TID) | ORAL | 0 refills | Status: DC

## 2021-07-01 MED ORDER — METHADONE HCL 5 MG PO TABS: 5.0000 mg | ORAL_TABLET | Freq: Three times a day (TID) | ORAL | 0 refills | Status: DC

## 2021-07-07 ENCOUNTER — Other Ambulatory Visit: Payer: Self-pay | Admitting: Internal Medicine

## 2021-07-07 DIAGNOSIS — I10 Essential (primary) hypertension: Secondary | ICD-10-CM

## 2021-07-09 LAB — CORTISOL, URINE, 24 HOUR
24 Hour urine volume (VMAHVA): 1250 mL
CREATININE, URINE: 1.14 g/(24.h) (ref 0.50–2.15)
Cortisol (Ur), Free: 15.5 mcg/24 h (ref 4.0–50.0)

## 2021-07-09 LAB — CORTISOL: Cortisol, Plasma: 0.9 ug/dL — ABNORMAL LOW

## 2021-07-16 ENCOUNTER — Encounter: Payer: Self-pay | Admitting: Endocrinology

## 2021-07-28 ENCOUNTER — Encounter: Payer: Self-pay | Admitting: Internal Medicine

## 2021-07-28 ENCOUNTER — Other Ambulatory Visit: Payer: Self-pay | Admitting: Internal Medicine

## 2021-07-28 DIAGNOSIS — M4802 Spinal stenosis, cervical region: Secondary | ICD-10-CM

## 2021-07-28 DIAGNOSIS — G894 Chronic pain syndrome: Secondary | ICD-10-CM

## 2021-07-28 MED ORDER — METHADONE HCL 10 MG PO TABS: 10.0000 mg | ORAL_TABLET | Freq: Three times a day (TID) | ORAL | 0 refills | Status: DC

## 2021-07-28 MED ORDER — METHADONE HCL 5 MG PO TABS: 5.0000 mg | ORAL_TABLET | Freq: Three times a day (TID) | ORAL | 0 refills | Status: DC

## 2021-08-25 ENCOUNTER — Other Ambulatory Visit: Payer: Self-pay | Admitting: Internal Medicine

## 2021-08-25 ENCOUNTER — Encounter: Payer: Self-pay | Admitting: Internal Medicine

## 2021-08-25 DIAGNOSIS — G894 Chronic pain syndrome: Secondary | ICD-10-CM

## 2021-08-25 DIAGNOSIS — M4802 Spinal stenosis, cervical region: Secondary | ICD-10-CM

## 2021-08-26 ENCOUNTER — Other Ambulatory Visit: Payer: Self-pay | Admitting: Internal Medicine

## 2021-08-26 ENCOUNTER — Encounter: Payer: Self-pay | Admitting: Internal Medicine

## 2021-08-26 DIAGNOSIS — M4802 Spinal stenosis, cervical region: Secondary | ICD-10-CM

## 2021-08-26 DIAGNOSIS — G894 Chronic pain syndrome: Secondary | ICD-10-CM

## 2021-08-26 MED ORDER — METHADONE HCL 5 MG PO TABS: 5.0000 mg | ORAL_TABLET | Freq: Three times a day (TID) | ORAL | 0 refills | Status: DC

## 2021-08-26 MED ORDER — METHADONE HCL 10 MG PO TABS: 10.0000 mg | ORAL_TABLET | Freq: Three times a day (TID) | ORAL | 0 refills | Status: DC

## 2021-08-27 ENCOUNTER — Ambulatory Visit: Payer: 59 | Admitting: Internal Medicine

## 2021-08-31 ENCOUNTER — Ambulatory Visit: Payer: 59 | Admitting: Internal Medicine

## 2021-09-10 ENCOUNTER — Ambulatory Visit: Payer: 59 | Admitting: Internal Medicine

## 2021-09-14 ENCOUNTER — Ambulatory Visit: Payer: 59 | Admitting: Internal Medicine

## 2021-09-15 ENCOUNTER — Ambulatory Visit: Payer: 59 | Admitting: Internal Medicine

## 2021-09-17 ENCOUNTER — Ambulatory Visit (INDEPENDENT_AMBULATORY_CARE_PROVIDER_SITE_OTHER): Payer: 59 | Admitting: Internal Medicine

## 2021-09-17 ENCOUNTER — Other Ambulatory Visit: Payer: Self-pay

## 2021-09-17 ENCOUNTER — Encounter: Payer: Self-pay | Admitting: Internal Medicine

## 2021-09-17 VITALS — BP 142/88 | HR 78 | Temp 97.9°F | Ht 60.0 in | Wt 151.0 lb

## 2021-09-17 DIAGNOSIS — M542 Cervicalgia: Secondary | ICD-10-CM | POA: Insufficient documentation

## 2021-09-17 DIAGNOSIS — D508 Other iron deficiency anemias: Secondary | ICD-10-CM

## 2021-09-17 DIAGNOSIS — I10 Essential (primary) hypertension: Secondary | ICD-10-CM

## 2021-09-17 DIAGNOSIS — D520 Dietary folate deficiency anemia: Secondary | ICD-10-CM

## 2021-09-17 DIAGNOSIS — Z1231 Encounter for screening mammogram for malignant neoplasm of breast: Secondary | ICD-10-CM

## 2021-09-17 DIAGNOSIS — M4802 Spinal stenosis, cervical region: Secondary | ICD-10-CM | POA: Diagnosis not present

## 2021-09-17 DIAGNOSIS — Z720 Tobacco use: Secondary | ICD-10-CM

## 2021-09-17 DIAGNOSIS — Z23 Encounter for immunization: Secondary | ICD-10-CM

## 2021-09-17 LAB — BASIC METABOLIC PANEL
BUN: 13 mg/dL (ref 6–23)
CO2: 32 mEq/L (ref 19–32)
Calcium: 9.8 mg/dL (ref 8.4–10.5)
Chloride: 105 mEq/L (ref 96–112)
Creatinine, Ser: 1.08 mg/dL (ref 0.40–1.20)
GFR: 57.3 mL/min — ABNORMAL LOW (ref >60.00)
Glucose, Bld: 118 mg/dL — ABNORMAL HIGH (ref 70–99)
Potassium: 4.3 mEq/L (ref 3.5–5.1)
Sodium: 141 mEq/L (ref 135–145)

## 2021-09-17 LAB — CBC WITH DIFFERENTIAL/PLATELET
Basophils Absolute: 0.1 10*3/uL (ref 0.0–0.1)
Basophils Relative: 0.6 % (ref 0.0–3.0)
Eosinophils Absolute: 0.1 10*3/uL (ref 0.0–0.7)
Eosinophils Relative: 0.5 % (ref 0.0–5.0)
HCT: 43.1 % (ref 36.0–46.0)
Hemoglobin: 14 g/dL (ref 12.0–15.0)
Lymphocytes Relative: 12.6 % (ref 12.0–46.0)
Lymphs Abs: 1.9 10*3/uL (ref 0.7–4.0)
MCHC: 32.4 g/dL (ref 30.0–36.0)
MCV: 87.1 fl (ref 78.0–100.0)
Monocytes Absolute: 0.7 10*3/uL (ref 0.1–1.0)
Monocytes Relative: 4.7 % (ref 3.0–12.0)
Neutro Abs: 12.2 10*3/uL — ABNORMAL HIGH (ref 1.4–7.7)
Neutrophils Relative %: 81.6 % — ABNORMAL HIGH (ref 43.0–77.0)
Platelets: 249 10*3/uL (ref 150.0–400.0)
RBC: 4.95 Mil/uL (ref 3.87–5.11)
RDW: 15 % (ref 11.5–15.5)
WBC: 15 10*3/uL — ABNORMAL HIGH (ref 4.0–10.5)

## 2021-09-17 NOTE — Patient Instructions (Signed)
Anemia °Anemia is a condition in which there is not enough red blood cells or hemoglobin in the blood. Hemoglobin is a substance in red blood cells that carries oxygen. °When you do not have enough red blood cells or hemoglobin (are anemic), your body cannot get enough oxygen and your organs may not work properly. As a result, you may feel very tired or have other problems. °What are the causes? °Common causes of anemia include: °Excessive bleeding. Anemia can be caused by excessive bleeding inside or outside the body, including bleeding from the intestines or from heavy menstrual periods in females. °Poor nutrition. °Long-lasting (chronic) kidney, thyroid, and liver disease. °Bone marrow disorders, spleen problems, and blood disorders. °Cancer and treatments for cancer. °HIV (human immunodeficiency virus) and AIDS (acquired immunodeficiency syndrome). °Infections, medicines, and autoimmune disorders that destroy red blood cells. °What are the signs or symptoms? °Symptoms of this condition include: °Minor weakness. °Dizziness. °Headache, or difficulties concentrating and sleeping. °Heartbeats that feel irregular or faster than normal (palpitations). °Shortness of breath, especially with exercise. °Pale skin, lips, and nails, or cold hands and feet. °Indigestion and nausea. °Symptoms may occur suddenly or develop slowly. If your anemia is mild, you may not have symptoms. °How is this diagnosed? °This condition is diagnosed based on blood tests, your medical history, and a physical exam. In some cases, a test may be needed in which cells are removed from the soft tissue inside of a bone and looked at under a microscope (bone marrow biopsy). Your health care provider may also check your stool (feces) for blood and may do additional testing to look for the cause of your bleeding. °Other tests may include: °Imaging tests, such as a CT scan or MRI. °A procedure to see inside your esophagus and stomach (endoscopy). °A  procedure to see inside your colon and rectum (colonoscopy). °How is this treated? °Treatment for this condition depends on the cause. If you continue to lose a lot of blood, you may need to be treated at a hospital. Treatment may include: °Taking supplements of iron, vitamin B12, or folic acid. °Taking a hormone medicine (erythropoietin) that can help to stimulate red blood cell growth. °Having a blood transfusion. This may be needed if you lose a lot of blood. °Making changes to your diet. °Having surgery to remove your spleen. °Follow these instructions at home: °Take over-the-counter and prescription medicines only as told by your health care provider. °Take supplements only as told by your health care provider. °Follow any diet instructions that you were given by your health care provider. °Keep all follow-up visits as told by your health care provider. This is important. °Contact a health care provider if: °You develop new bleeding anywhere in the body. °Get help right away if: °You are very weak. °You are short of breath. °You have pain in your abdomen or chest. °You are dizzy or feel faint. °You have trouble concentrating. °You have bloody stools, black stools, or tarry stools. °You vomit repeatedly or you vomit up blood. °These symptoms may represent a serious problem that is an emergency. Do not wait to see if the symptoms will go away. Get medical help right away. Call your local emergency services (911 in the [place] Do not drive yourself to the hospital. °Summary °Anemia is a condition in which you do not have enough red blood cells or enough of a substance in your red blood cells that carries oxygen (hemoglobin). °Symptoms may occur suddenly or develop slowly. °If your anemia is   mild, you may not have symptoms. °This condition is diagnosed with blood tests, a medical history, and a physical exam. Other tests may be needed. °Treatment for this condition depends on the cause of the anemia. °This  information is not intended to replace advice given to you by your health care provider. Make sure you discuss any questions you have with your health care provider. °Document Revised: 07/03/2019 Document Reviewed: 07/03/2019 °Elsevier Patient Education © 2022 [employer] ° °

## 2021-09-17 NOTE — Progress Notes (Signed)
Subjective:  Patient ID: Emily [name], female    DOB: Oct 16, 1965  Age: 56 y.o. MRN: [id number]  CC: Hypertension  This visit occurred during the SARS-CoV-2 public health emergency.  Safety protocols were in place, including screening questions prior to the visit, additional usage of staff PPE, and extensive cleaning of exam room while observing appropriate contact time as indicated for disinfecting solutions.    HPI SMT STIDAM presents for f/up -  She complains of gradually worsening neck pain.  There is some radiation into the extremities and numbness in both hands.  She is controlling the pain with methadone.  She denies headache, blurred vision, chest pain, shortness of breath, or edema.  Outpatient Medications Prior to Visit  Medication Sig Dispense Refill   atorvastatin (LIPITOR) 10 MG tablet Take 1 tablet (10 mg total) by mouth daily. 90 tablet 1   carvedilol (COREG) 12.5 MG tablet Take 1 tablet (12.5 mg total) by mouth 2 (two) times daily with a meal. 180 tablet 0   Cholecalciferol 50 MCG (2000 UT) TABS Take 1 tablet (2,000 Units total) by mouth daily. 90 tablet 1   cloNIDine (CATAPRES - DOSED IN MG/24 HR) 0.2 mg/24hr patch APPLY 1 PATCH TO THE SKIN ONCE A WEEK 12 patch 0   cyanocobalamin 2000 MCG tablet Take 1 tablet (2,000 mcg total) by mouth daily. 90 tablet 1   dexamethasone (DECADRON) 1 MG tablet Take at 9-10 PM, the night before blood test 1 tablet 0   folic acid (FOLVITE) 1 MG tablet TAKE 1 TABLET(1 MG) BY MOUTH DAILY 90 tablet 0   methadone (DOLOPHINE) 10 MG tablet Take 1 tablet (10 mg total) by mouth every 8 (eight) hours. 90 tablet 0   methadone (DOLOPHINE) 5 MG tablet Take 1 tablet (5 mg total) by mouth every 8 (eight) hours. 90 tablet 0   olmesartan (BENICAR) 40 MG tablet Take 1 tablet (40 mg total) by mouth daily. 90 tablet 0   Omega-3 Fatty Acids (FISH OIL) 1000 MG CAPS Take 2 capsules by mouth every evening.     TRELEGY ELLIPTA 100-62.5-25 MCG/INH AEPB  Inhale 1 puff into the lungs daily. 3 each 1   triamterene-hydrochlorothiazide (DYAZIDE) 37.5-25 MG capsule Take 1 each (1 capsule total) by mouth daily. 90 capsule 0   No facility-administered medications prior to visit.    ROS Review of Systems  Constitutional:  Negative for chills, diaphoresis, fatigue and fever.  HENT: Negative.    Eyes: Negative.   Respiratory:  Negative for cough, shortness of breath and wheezing.   Cardiovascular:  Negative for chest pain, palpitations and leg swelling.  Gastrointestinal:  Negative for abdominal pain, constipation, diarrhea, nausea and vomiting.  Endocrine: Negative.   Genitourinary: Negative.  Negative for difficulty urinating and dysuria.  Musculoskeletal:  Positive for neck pain and neck stiffness. Negative for arthralgias and myalgias.  Neurological:  Positive for numbness. Negative for dizziness, speech difficulty, weakness and headaches.  Hematological:  Negative for adenopathy. Does not bruise/bleed easily.  Psychiatric/Behavioral: Negative.     Objective:  BP (!) 142/88 (BP Location: Left Arm, Patient Position: Sitting, Cuff Size: Large)    Pulse 78    Temp 97.9 F (36.6 C) (Oral)    Ht 5' (1.524 m)    Wt 151 lb (68.5 kg)    SpO2 96%    BMI 29.49 kg/m   BP Readings from Last 3 Encounters:  09/17/21 (!) 142/88  06/12/21 (!) 180/120  05/21/21 (!) 160/90  Wt Readings from Last 3 Encounters:  09/17/21 151 lb (68.5 kg)  06/12/21 155 lb (70.3 kg)  05/21/21 155 lb 9.6 oz (70.6 kg)    Physical Exam Vitals reviewed.  Constitutional:      Appearance: Normal appearance.  HENT:     Nose: Nose normal.     Mouth/Throat:     Mouth: Mucous membranes are moist.  Eyes:     General: No scleral icterus.    Conjunctiva/sclera: Conjunctivae normal.  Cardiovascular:     Rate and Rhythm: Normal rate and regular rhythm.     Heart sounds: No murmur heard. Pulmonary:     Effort: Pulmonary effort is normal.     Breath sounds: No stridor. No  wheezing, rhonchi or rales.  Abdominal:     General: Abdomen is flat.     Palpations: There is no mass.     Tenderness: There is no abdominal tenderness. There is no guarding.     Hernia: No hernia is present.  Musculoskeletal:     Cervical back: Normal and neck supple. No tenderness or bony tenderness. No pain with movement. Normal range of motion.     Thoracic back: Normal.     Lumbar back: Normal.  Lymphadenopathy:     Cervical: No cervical adenopathy.  Skin:    General: Skin is warm and dry.  Neurological:     Mental Status: She is alert.     Cranial Nerves: No cranial nerve deficit.     Motor: Motor function is intact. No weakness.     Deep Tendon Reflexes: Reflexes normal.     Reflex Scores:      Tricep reflexes are 1+ on the right side and 1+ on the left side.      Bicep reflexes are 1+ on the right side and 1+ on the left side.      Brachioradialis reflexes are 1+ on the right side and 1+ on the left side.      Patellar reflexes are 1+ on the right side and 1+ on the left side.      Achilles reflexes are 1+ on the right side and 0 on the left side.   Lab Results  Component Value Date   WBC 15.0 (H) 09/17/2021   HGB 14.0 09/17/2021   HCT 43.1 09/17/2021   PLT 249.0 09/17/2021   GLUCOSE 118 (H) 09/17/2021   CHOL 167 10/09/2020   TRIG 229.0 (H) 10/09/2020   HDL 28.50 (L) 10/09/2020   LDLDIRECT 95.0 10/09/2020   LDLCALC 92 10/31/2017   ALT 8 10/09/2020   AST 20 10/09/2020   NA 141 09/17/2021   K 4.3 09/17/2021   CL 105 09/17/2021   CREATININE 1.08 09/17/2021   BUN 13 09/17/2021   CO2 32 09/17/2021   TSH 1.67 05/06/2021   INR 1.1 (H) 10/09/2020   HGBA1C 5.9 10/09/2020    CT Abdomen Pelvis W Contrast  Result Date: 10/09/2020 CLINICAL DATA:  Unintended weight loss. 10 pound weight loss in 6 months. EXAM: CT ABDOMEN AND PELVIS WITH CONTRAST TECHNIQUE: Multidetector CT imaging of the abdomen and pelvis was performed using the standard protocol following bolus  administration of intravenous contrast. Creatinine was obtained on site at [place] at [address] Results: Creatinine 1.0 mg/dL. CONTRAST:  164mL ISOVUE-300 IOPAMIDOL (ISOVUE-300) INJECTION 61% COMPARISON:  Renal ultrasound 08/15/2017. Abdominopelvic CT 04/24/2008 FINDINGS: Lower chest: Mild atelectasis or scarring in the lingula. No basilar airspace disease or pulmonary nodule. No pleural fluid. Heart is  normal in size. There is fluid within the distal esophagus. Hepatobiliary: Mild decreased hepatic density typical of steatosis. No focal liver lesion. Gallbladder physiologically distended, no calcified stone. Gallstone on prior renal ultrasound is not seen by CT. There is flaring of the distal common bile duct at 8 mm. No intrahepatic biliary ductal dilatation. Pancreas: No ductal dilatation or inflammation. No evidence of pancreatic mass. Spleen: Normal in size without focal abnormality. Adrenals/Urinary Tract: Normal adrenal glands. No hydronephrosis or perinephric edema. Homogeneous renal enhancement with symmetric excretion on delayed phase imaging. No evidence of renal stone or focal lesion. Urinary bladder is partially distended without wall thickening. Stomach/Bowel: Fluid within the distal esophagus without wall thickening. The stomach is unremarkable. Normal positioning of the duodenum and ligament of Treitz. Normal small bowel without wall thickening, inflammation, or obstruction. The appendix is surgically absent. The terminal ileum is normal. Cecum is slightly high-riding in the mid abdomen. Moderate volume of stool throughout the colon. Tortuosity of the transverse, descending, and sigmoid colon. No colonic wall thickening or pericolonic edema. No evidence of colonic mass. No significant diverticular disease. Vascular/Lymphatic: Mild to moderate aorto bi-iliac atherosclerosis. No aortic aneurysm. Patent portal vein. No acute vascular findings. No enlarged lymph nodes in the abdomen  or pelvis. Reproductive: Normal CT appearance of the uterus. Quiescent ovaries without adnexal mass. Other: No ascites. Small fat containing umbilical hernia. No evidence of intra-abdominal mass. Musculoskeletal: Punctate sclerotic density in L1 is unchanged from prior consistent with bone island. There are no acute or suspicious osseous abnormalities. IMPRESSION: 1. Flaring of the distal common bile duct to 8 mm. There is no intrahepatic biliary ductal dilatation or abnormal gallbladder distension. Recommend correlation with LFTs. If elevated, recommend further evaluation with MRCP. 2. Fluid within the distal esophagus, can be seen with reflux or esophageal dysmotility. 3. Mild hepatic steatosis. 4. Moderate colonic stool burden with colonic tortuosity, can be seen with constipation. Aortic Atherosclerosis (ICD10-I70.0). Electronically Signed   By: Keith Rake M.D.   On: 10/09/2020 12:40    Assessment & Plan:   Chequita was seen today for hypertension.  Diagnoses and all orders for this visit:  Dietary folate deficiency anemia -     CBC with Differential/Platelet; Future -     CBC with Differential/Platelet  Iron deficiency anemia secondary to inadequate dietary iron intake- Her H&H are normal now. -     CBC with Differential/Platelet; Future -     CBC with Differential/Platelet  Cervical spinal stenosis -     MR Cervical Spine Wo Contrast; Future  Essential hypertension- Her blood pressure is adequately well controlled.  Will continue the current antihypertensives. -     Basic metabolic panel; Future -     Basic metabolic panel  Cervicalgia -     MR Cervical Spine Wo Contrast; Future  Tobacco abuse -     Ambulatory Referral for Lung Cancer Scre  Visit for screening mammogram -     MM DIGITAL SCREENING BILATERAL; Future  Need for prophylactic vaccination and inoculation against varicella -     Zoster Vaccine Adjuvanted [facility]) injection; Inject 0.5 mLs into the muscle once  for 1 dose.   I am having Marygrace Drought start on Shingrix. I am also having her maintain her cyanocobalamin, Fish Oil, atorvastatin, Cholecalciferol, Trelegy Ellipta, folic acid, triamterene-hydrochlorothiazide, carvedilol, olmesartan, dexamethasone, cloNIDine, methadone, and methadone.  Meds ordered this encounter  Medications   Zoster Vaccine Adjuvanted [facility]) injection    Sig: Inject 0.5 mLs into the muscle  once for 1 dose.    Dispense:  0.5 mL    Refill:  1     Follow-up: Return in about 3 months (around 12/15/2021).  Scarlette Calico, MD

## 2021-09-18 MED ORDER — SHINGRIX 50 MCG/0.5ML IM SUSR: 0.5000 mL | Freq: Once | INTRAMUSCULAR | 1 refills | Status: AC

## 2021-09-22 ENCOUNTER — Other Ambulatory Visit: Payer: Self-pay | Admitting: Internal Medicine

## 2021-09-22 DIAGNOSIS — G894 Chronic pain syndrome: Secondary | ICD-10-CM

## 2021-09-22 DIAGNOSIS — M4802 Spinal stenosis, cervical region: Secondary | ICD-10-CM

## 2021-09-23 ENCOUNTER — Encounter: Payer: Self-pay | Admitting: Internal Medicine

## 2021-09-23 DIAGNOSIS — M4802 Spinal stenosis, cervical region: Secondary | ICD-10-CM

## 2021-09-23 DIAGNOSIS — G894 Chronic pain syndrome: Secondary | ICD-10-CM

## 2021-09-23 MED ORDER — METHADONE HCL 10 MG PO TABS: 10.0000 mg | ORAL_TABLET | Freq: Three times a day (TID) | ORAL | 0 refills | Status: DC

## 2021-09-23 MED ORDER — METHADONE HCL 5 MG PO TABS: 5.0000 mg | ORAL_TABLET | Freq: Three times a day (TID) | ORAL | 0 refills | Status: DC

## 2021-10-02 ENCOUNTER — Other Ambulatory Visit: Payer: Self-pay | Admitting: Internal Medicine

## 2021-10-02 DIAGNOSIS — I10 Essential (primary) hypertension: Secondary | ICD-10-CM

## 2021-10-20 ENCOUNTER — Ambulatory Visit: Payer: 59 | Admitting: Internal Medicine

## 2021-10-21 ENCOUNTER — Other Ambulatory Visit: Payer: Self-pay | Admitting: Internal Medicine

## 2021-10-21 ENCOUNTER — Encounter: Payer: Self-pay | Admitting: Internal Medicine

## 2021-10-21 DIAGNOSIS — M4802 Spinal stenosis, cervical region: Secondary | ICD-10-CM

## 2021-10-21 DIAGNOSIS — I10 Essential (primary) hypertension: Secondary | ICD-10-CM

## 2021-10-21 DIAGNOSIS — E785 Hyperlipidemia, unspecified: Secondary | ICD-10-CM

## 2021-10-21 DIAGNOSIS — G894 Chronic pain syndrome: Secondary | ICD-10-CM

## 2021-10-22 MED ORDER — METHADONE HCL 10 MG PO TABS: 10.0000 mg | ORAL_TABLET | Freq: Three times a day (TID) | ORAL | 0 refills | Status: DC

## 2021-10-22 MED ORDER — METHADONE HCL 5 MG PO TABS: 5.0000 mg | ORAL_TABLET | Freq: Three times a day (TID) | ORAL | 0 refills | Status: DC

## 2021-10-29 ENCOUNTER — Ambulatory Visit: Payer: 59 | Admitting: Internal Medicine

## 2021-11-02 ENCOUNTER — Ambulatory Visit: Payer: 59 | Admitting: Internal Medicine

## 2021-11-18 ENCOUNTER — Other Ambulatory Visit: Payer: Self-pay | Admitting: Internal Medicine

## 2021-11-18 DIAGNOSIS — G894 Chronic pain syndrome: Secondary | ICD-10-CM

## 2021-11-18 DIAGNOSIS — M4802 Spinal stenosis, cervical region: Secondary | ICD-10-CM

## 2021-11-18 MED ORDER — METHADONE HCL 5 MG PO TABS: 5.0000 mg | ORAL_TABLET | Freq: Three times a day (TID) | ORAL | 0 refills | Status: DC

## 2021-11-18 MED ORDER — METHADONE HCL 10 MG PO TABS: 10.0000 mg | ORAL_TABLET | Freq: Three times a day (TID) | ORAL | 0 refills | Status: DC

## 2021-11-19 ENCOUNTER — Encounter: Payer: Self-pay | Admitting: Internal Medicine

## 2021-12-16 ENCOUNTER — Ambulatory Visit: Payer: 59 | Admitting: Internal Medicine

## 2021-12-16 ENCOUNTER — Other Ambulatory Visit: Payer: Self-pay | Admitting: Internal Medicine

## 2021-12-16 DIAGNOSIS — M4802 Spinal stenosis, cervical region: Secondary | ICD-10-CM

## 2021-12-16 DIAGNOSIS — G894 Chronic pain syndrome: Secondary | ICD-10-CM

## 2021-12-17 ENCOUNTER — Encounter: Payer: Self-pay | Admitting: Internal Medicine

## 2021-12-17 MED ORDER — METHADONE HCL 5 MG PO TABS: 5.0000 mg | ORAL_TABLET | Freq: Three times a day (TID) | ORAL | 0 refills | Status: DC

## 2021-12-17 MED ORDER — METHADONE HCL 10 MG PO TABS: 10.0000 mg | ORAL_TABLET | Freq: Three times a day (TID) | ORAL | 0 refills | Status: DC

## 2021-12-17 NOTE — Telephone Encounter (Signed)
Patient is requesting a medication refill request for her Methadone 10 mg and Methadone 5 mg.  Please advise  ? ? ?

## 2021-12-25 ENCOUNTER — Other Ambulatory Visit: Payer: Self-pay | Admitting: Internal Medicine

## 2021-12-25 DIAGNOSIS — I10 Essential (primary) hypertension: Secondary | ICD-10-CM

## 2022-01-07 ENCOUNTER — Other Ambulatory Visit: Payer: 59

## 2022-01-14 ENCOUNTER — Ambulatory Visit (INDEPENDENT_AMBULATORY_CARE_PROVIDER_SITE_OTHER): Payer: Medicaid Other | Admitting: Internal Medicine

## 2022-01-14 ENCOUNTER — Encounter: Payer: Self-pay | Admitting: Internal Medicine

## 2022-01-14 VITALS — BP 164/98 | HR 72 | Temp 98.1°F | Ht 60.0 in | Wt 151.0 lb

## 2022-01-14 DIAGNOSIS — E785 Hyperlipidemia, unspecified: Secondary | ICD-10-CM

## 2022-01-14 DIAGNOSIS — Z79891 Long term (current) use of opiate analgesic: Secondary | ICD-10-CM

## 2022-01-14 DIAGNOSIS — R7989 Other specified abnormal findings of blood chemistry: Secondary | ICD-10-CM

## 2022-01-14 DIAGNOSIS — N1831 Chronic kidney disease, stage 3a: Secondary | ICD-10-CM | POA: Diagnosis not present

## 2022-01-14 DIAGNOSIS — I10 Essential (primary) hypertension: Secondary | ICD-10-CM | POA: Diagnosis not present

## 2022-01-14 DIAGNOSIS — E781 Pure hyperglyceridemia: Secondary | ICD-10-CM | POA: Diagnosis not present

## 2022-01-14 DIAGNOSIS — M4802 Spinal stenosis, cervical region: Secondary | ICD-10-CM | POA: Diagnosis not present

## 2022-01-14 DIAGNOSIS — G894 Chronic pain syndrome: Secondary | ICD-10-CM | POA: Diagnosis not present

## 2022-01-14 DIAGNOSIS — J449 Chronic obstructive pulmonary disease, unspecified: Secondary | ICD-10-CM

## 2022-01-14 LAB — URINALYSIS, ROUTINE W REFLEX MICROSCOPIC
Bilirubin Urine: NEGATIVE
Ketones, ur: NEGATIVE
Leukocytes,Ua: NEGATIVE
Nitrite: NEGATIVE
Specific Gravity, Urine: <=1.005 — AB (ref 1.000–1.030)
Total Protein, Urine: NEGATIVE
Urine Glucose: NEGATIVE
Urobilinogen, UA: 0.2 (ref 0.0–1.0)
pH: 6.5 (ref 5.0–8.0)

## 2022-01-14 LAB — LDL CHOLESTEROL, DIRECT: Direct LDL: 92 mg/dL

## 2022-01-14 LAB — LIPID PANEL
Cholesterol: 153 mg/dL (ref 0–200)
HDL: 27 mg/dL — ABNORMAL LOW (ref >39.00)
NonHDL: 125.69
Total CHOL/HDL Ratio: 6
Triglycerides: 225 mg/dL — ABNORMAL HIGH (ref 0.0–149.0)
VLDL: 45 mg/dL — ABNORMAL HIGH (ref 0.0–40.0)

## 2022-01-14 LAB — BASIC METABOLIC PANEL
BUN: 15 mg/dL (ref 6–23)
CO2: 28 mEq/L (ref 19–32)
Calcium: 9.8 mg/dL (ref 8.4–10.5)
Chloride: 102 mEq/L (ref 96–112)
Creatinine, Ser: 1.15 mg/dL (ref 0.40–1.20)
GFR: 53.02 mL/min — ABNORMAL LOW (ref >60.00)
Glucose, Bld: 99 mg/dL (ref 70–99)
Potassium: 4 mEq/L (ref 3.5–5.1)
Sodium: 139 mEq/L (ref 135–145)

## 2022-01-14 LAB — HEPATIC FUNCTION PANEL
ALT: 5 U/L (ref 0–35)
AST: 15 U/L (ref 0–37)
Albumin: 4.2 g/dL (ref 3.5–5.2)
Alkaline Phosphatase: 114 U/L (ref 39–117)
Bilirubin, Direct: 0.1 mg/dL (ref 0.0–0.3)
Total Bilirubin: 0.4 mg/dL (ref 0.2–1.2)
Total Protein: 7.4 g/dL (ref 6.0–8.3)

## 2022-01-14 LAB — CORTISOL: Cortisol, Plasma: 20.2 ug/dL

## 2022-01-14 MED ORDER — METHADONE HCL 10 MG PO TABS: 10.0000 mg | ORAL_TABLET | Freq: Three times a day (TID) | ORAL | 0 refills | Status: DC

## 2022-01-14 MED ORDER — METHADONE HCL 5 MG PO TABS: 5.0000 mg | ORAL_TABLET | Freq: Three times a day (TID) | ORAL | 0 refills | Status: DC

## 2022-01-14 NOTE — Patient Instructions (Signed)
Hypertension, Adult High blood pressure (hypertension) is when the force of blood pumping through the arteries is too strong. The arteries are the blood vessels that carry blood from the heart throughout the body. Hypertension forces the heart to work harder to pump blood and may cause arteries to become narrow or stiff. Untreated or uncontrolled hypertension can lead to a heart attack, heart failure, a stroke, kidney disease, and other problems. A blood pressure reading consists of a higher number over a lower number. Ideally, your blood pressure should be below 120/80. The first ("top") number is called the systolic pressure. It is a measure of the pressure in your arteries as your heart beats. The second ("bottom") number is called the diastolic pressure. It is a measure of the pressure in your arteries as the heart relaxes. What are the causes? The exact cause of this condition is not known. There are some conditions that result in high blood pressure. What increases the risk? Certain factors may make you more likely to develop high blood pressure. Some of these risk factors are under your control, including: Smoking. Not getting enough exercise or physical activity. Being overweight. Having too much fat, sugar, calories, or salt (sodium) in your diet. Drinking too much alcohol. Other risk factors include: Having a personal history of heart disease, diabetes, high cholesterol, or kidney disease. Stress. Having a family history of high blood pressure and high cholesterol. Having obstructive sleep apnea. Age. The risk increases with age. What are the signs or symptoms? High blood pressure may not cause symptoms. Very high blood pressure (hypertensive crisis) may cause: Headache. Fast or irregular heartbeats (palpitations). Shortness of breath. Nosebleed. Nausea and vomiting. Vision changes. Severe chest pain, dizziness, and seizures. How is this diagnosed? This condition is diagnosed by  measuring your blood pressure while you are seated, with your arm resting on a flat surface, your legs uncrossed, and your feet flat on the floor. The cuff of the blood pressure monitor will be placed directly against the skin of your upper arm at the level of your heart. Blood pressure should be measured at least twice using the same arm. Certain conditions can cause a difference in blood pressure between your right and left arms. If you have a high blood pressure reading during one visit or you have normal blood pressure with other risk factors, you may be asked to: Return on a different day to have your blood pressure checked again. Monitor your blood pressure at home for 1 week or longer. If you are diagnosed with hypertension, you may have other blood or imaging tests to help your health care provider understand your overall risk for other conditions. How is this treated? This condition is treated by making healthy lifestyle changes, such as eating healthy foods, exercising more, and reducing your alcohol intake. You may be referred for counseling on a healthy diet and physical activity. Your health care provider may prescribe medicine if lifestyle changes are not enough to get your blood pressure under control and if: Your systolic blood pressure is above 130. Your diastolic blood pressure is above 80. Your personal target blood pressure may vary depending on your medical conditions, your age, and other factors. Follow these instructions at home: Eating and drinking  Eat a diet that is high in fiber and potassium, and low in sodium, added sugar, and fat. An example of this eating plan is called the DASH diet. DASH stands for Dietary Approaches to Stop Hypertension. To eat this way: Eat   plenty of fresh fruits and vegetables. Try to fill one half of your plate at each meal with fruits and vegetables. Eat whole grains, such as whole-wheat pasta, brown rice, or whole-grain bread. Fill about one  fourth of your plate with whole grains. Eat or drink low-fat dairy products, such as skim milk or low-fat yogurt. Avoid fatty cuts of meat, processed or cured meats, and poultry with skin. Fill about one fourth of your plate with lean proteins, such as fish, chicken without skin, beans, eggs, or tofu. Avoid pre-made and processed foods. These tend to be higher in sodium, added sugar, and fat. Reduce your daily sodium intake. Many people with hypertension should eat less than 1,500 mg of sodium a day. Do not drink alcohol if: Your health care provider tells you not to drink. You are pregnant, may be pregnant, or are planning to become pregnant. If you drink alcohol: Limit how much you have to: 0-1 drink a day for women. 0-2 drinks a day for men. Know how much alcohol is in your drink. In the [place] one drink equals one 12 oz bottle of beer (355 mL), one 5 oz glass of wine (148 mL), or one 1 oz glass of hard liquor (44 mL). Lifestyle  Work with your health care provider to maintain a healthy body weight or to lose weight. Ask what an ideal weight is for you. Get at least 30 minutes of exercise that causes your heart to beat faster (aerobic exercise) most days of the week. Activities may include walking, swimming, or biking. Include exercise to strengthen your muscles (resistance exercise), such as Pilates or lifting weights, as part of your weekly exercise routine. Try to do these types of exercises for 30 minutes at least 3 days a week. Do not use any products that contain nicotine or tobacco. These products include cigarettes, chewing tobacco, and vaping devices, such as e-cigarettes. If you need help quitting, ask your health care provider. Monitor your blood pressure at home as told by your health care provider. Keep all follow-up visits. This is important. Medicines Take over-the-counter and prescription medicines only as told by your health care provider. Follow directions carefully. Blood  pressure medicines must be taken as prescribed. Do not skip doses of blood pressure medicine. Doing this puts you at risk for problems and can make the medicine less effective. Ask your health care provider about side effects or reactions to medicines that you should watch for. Contact a health care provider if you: Think you are having a reaction to a medicine you are taking. Have headaches that keep coming back (recurring). Feel dizzy. Have swelling in your ankles. Have trouble with your vision. Get help right away if you: Develop a severe headache or confusion. Have unusual weakness or numbness. Feel faint. Have severe pain in your chest or abdomen. Vomit repeatedly. Have trouble breathing. These symptoms may be an emergency. Get help right away. Call 911. Do not wait to see if the symptoms will go away. Do not drive yourself to the hospital. Summary Hypertension is when the force of blood pumping through your arteries is too strong. If this condition is not controlled, it may put you at risk for serious complications. Your personal target blood pressure may vary depending on your medical conditions, your age, and other factors. For most people, a normal blood pressure is less than 120/80. Hypertension is treated with lifestyle changes, medicines, or a combination of both. Lifestyle changes include losing weight, eating a healthy,   low-sodium diet, exercising more, and limiting alcohol. This information is not intended to replace advice given to you by your health care provider. Make sure you discuss any questions you have with your health care provider. Document Revised: 06/02/2021 Document Reviewed: 06/02/2021 Elsevier Patient Education  2023 [employer]  

## 2022-01-14 NOTE — Progress Notes (Unsigned)
Subjective:  Patient ID: Emily [name], female    DOB: 24-Dec-1965  Age: 56 y.o. MRN: [id number]  CC: Hypertension and Hyperlipidemia   HPI Emily [name] presents for f/up -  She is not taking Dyazide because it caused side effects.  She is taking a 73-year-old prescription of carvedilol 6.25 - 4 tablets twice a day.  She is active and denies chest pain, shortness of breath, palpitations, dizziness, lightheadedness, diaphoresis, or edema.  Outpatient Medications Prior to Visit  Medication Sig Dispense Refill   atorvastatin (LIPITOR) 10 MG tablet TAKE 1 TABLET(10 MG) BY MOUTH DAILY 90 tablet 1   Cholecalciferol 50 MCG (2000 UT) TABS Take 1 tablet (2,000 Units total) by mouth daily. 90 tablet 1   cloNIDine (CATAPRES - DOSED IN MG/24 HR) 0.2 mg/24hr patch APPLY 1 PATCH TOPICALLY TO THE SKIN 1 TIME A WEEK 12 patch 0   cyanocobalamin 2000 MCG tablet Take 1 tablet (2,000 mcg total) by mouth daily. 90 tablet 1   folic acid (FOLVITE) 1 MG tablet TAKE 1 TABLET(1 MG) BY MOUTH DAILY 90 tablet 0   Omega-3 Fatty Acids (FISH OIL) 1000 MG CAPS Take 2 capsules by mouth every evening.     carvedilol (COREG) 12.5 MG tablet Take 1 tablet (12.5 mg total) by mouth 2 (two) times daily with a meal. 180 tablet 0   dexamethasone (DECADRON) 1 MG tablet Take at 9-10 PM, the night before blood test 1 tablet 0   methadone (DOLOPHINE) 10 MG tablet Take 1 tablet (10 mg total) by mouth every 8 (eight) hours. 90 tablet 0   methadone (DOLOPHINE) 5 MG tablet Take 1 tablet (5 mg total) by mouth every 8 (eight) hours. 90 tablet 0   olmesartan (BENICAR) 40 MG tablet TAKE 1 TABLET(40 MG) BY MOUTH DAILY 90 tablet 0   TRELEGY ELLIPTA 100-62.5-25 MCG/INH AEPB Inhale 1 puff into the lungs daily. 3 each 1   triamterene-hydrochlorothiazide (DYAZIDE) 37.5-25 MG capsule Take 1 each (1 capsule total) by mouth daily. 90 capsule 0   No facility-administered medications prior to visit.    ROS Review of Systems  Constitutional:   Negative for chills, diaphoresis, fatigue and fever.  HENT: Negative.    Eyes: Negative.   Respiratory:  Negative for cough, shortness of breath and wheezing.   Cardiovascular:  Negative for chest pain, palpitations and leg swelling.  Gastrointestinal:  Negative for abdominal pain, diarrhea, nausea and vomiting.  Endocrine: Negative.   Genitourinary: Negative.  Negative for difficulty urinating.  Musculoskeletal:  Positive for neck pain. Negative for back pain, gait problem and myalgias.  Skin: Negative.   Neurological:  Negative for dizziness, weakness, light-headedness, numbness and headaches.  Hematological:  Negative for adenopathy. Does not bruise/bleed easily.  Psychiatric/Behavioral: Negative.      Objective:  BP (!) 164/98 (BP Location: Right Arm, Patient Position: Sitting, Cuff Size: Large)   Pulse 72   Temp 98.1 F (36.7 C) (Oral)   Ht 5' (1.524 m)   Wt 151 lb (68.5 kg)   SpO2 97%   BMI 29.49 kg/m   BP Readings from Last 3 Encounters:  01/14/22 (!) 164/98  09/17/21 (!) 142/88  06/12/21 (!) 180/120    Wt Readings from Last 3 Encounters:  01/14/22 151 lb (68.5 kg)  09/17/21 151 lb (68.5 kg)  06/12/21 155 lb (70.3 kg)    Physical Exam Vitals reviewed.  HENT:     Nose: Nose normal.     Mouth/Throat:     Mouth:  Mucous membranes are moist.  Eyes:     General: No scleral icterus.    Conjunctiva/sclera: Conjunctivae normal.  Cardiovascular:     Rate and Rhythm: Normal rate and regular rhythm.     Heart sounds: No murmur heard. Pulmonary:     Effort: Pulmonary effort is normal.     Breath sounds: No stridor. No wheezing, rhonchi or rales.  Abdominal:     Palpations: There is no mass.     Tenderness: There is no abdominal tenderness. There is no guarding.     Hernia: No hernia is present.  Musculoskeletal:        General: Normal range of motion.     Cervical back: Neck supple.     Right lower leg: No edema.     Left lower leg: No edema.  Lymphadenopathy:      Cervical: No cervical adenopathy.  Skin:    General: Skin is warm and dry.     Findings: No rash.  Neurological:     General: No focal deficit present.     Mental Status: She is alert. Mental status is at baseline.  Psychiatric:        Mood and Affect: Mood normal.        Behavior: Behavior normal.     Lab Results  Component Value Date   WBC 15.0 (H) 09/17/2021   HGB 14.0 09/17/2021   HCT 43.1 09/17/2021   PLT 249.0 09/17/2021   GLUCOSE 99 01/14/2022   CHOL 153 01/14/2022   TRIG 225.0 (H) 01/14/2022   HDL 27.00 (L) 01/14/2022   LDLDIRECT 92.0 01/14/2022   LDLCALC 92 10/31/2017   ALT 5 01/14/2022   AST 15 01/14/2022   NA 139 01/14/2022   K 4.0 01/14/2022   CL 102 01/14/2022   CREATININE 1.15 01/14/2022   BUN 15 01/14/2022   CO2 28 01/14/2022   TSH 1.67 05/06/2021   INR 1.1 (H) 10/09/2020   HGBA1C 5.9 10/09/2020    CT Abdomen Pelvis W Contrast  Result Date: 10/09/2020 CLINICAL DATA:  Unintended weight loss. 10 pound weight loss in 6 months. EXAM: CT ABDOMEN AND PELVIS WITH CONTRAST TECHNIQUE: Multidetector CT imaging of the abdomen and pelvis was performed using the standard protocol following bolus administration of intravenous contrast. Creatinine was obtained on site at [facility] at [address] Results: Creatinine 1.0 mg/dL. CONTRAST:  120m ISOVUE-300 IOPAMIDOL (ISOVUE-300) INJECTION 61% COMPARISON:  Renal ultrasound 08/15/2017. Abdominopelvic CT 04/24/2008 FINDINGS: Lower chest: Mild atelectasis or scarring in the lingula. No basilar airspace disease or pulmonary nodule. No pleural fluid. Heart is normal in size. There is fluid within the distal esophagus. Hepatobiliary: Mild decreased hepatic density typical of steatosis. No focal liver lesion. Gallbladder physiologically distended, no calcified stone. Gallstone on prior renal ultrasound is not seen by CT. There is flaring of the distal common bile duct at 8 mm. No intrahepatic biliary ductal  dilatation. Pancreas: No ductal dilatation or inflammation. No evidence of pancreatic mass. Spleen: Normal in size without focal abnormality. Adrenals/Urinary Tract: Normal adrenal glands. No hydronephrosis or perinephric edema. Homogeneous renal enhancement with symmetric excretion on delayed phase imaging. No evidence of renal stone or focal lesion. Urinary bladder is partially distended without wall thickening. Stomach/Bowel: Fluid within the distal esophagus without wall thickening. The stomach is unremarkable. Normal positioning of the duodenum and ligament of Treitz. Normal small bowel without wall thickening, inflammation, or obstruction. The appendix is surgically absent. The terminal ileum is normal. Cecum is slightly high-riding  in the mid abdomen. Moderate volume of stool throughout the colon. Tortuosity of the transverse, descending, and sigmoid colon. No colonic wall thickening or pericolonic edema. No evidence of colonic mass. No significant diverticular disease. Vascular/Lymphatic: Mild to moderate aorto bi-iliac atherosclerosis. No aortic aneurysm. Patent portal vein. No acute vascular findings. No enlarged lymph nodes in the abdomen or pelvis. Reproductive: Normal CT appearance of the uterus. Quiescent ovaries without adnexal mass. Other: No ascites. Small fat containing umbilical hernia. No evidence of intra-abdominal mass. Musculoskeletal: Punctate sclerotic density in L1 is unchanged from prior consistent with bone island. There are no acute or suspicious osseous abnormalities. IMPRESSION: 1. Flaring of the distal common bile duct to 8 mm. There is no intrahepatic biliary ductal dilatation or abnormal gallbladder distension. Recommend correlation with LFTs. If elevated, recommend further evaluation with MRCP. 2. Fluid within the distal esophagus, can be seen with reflux or esophageal dysmotility. 3. Mild hepatic steatosis. 4. Moderate colonic stool burden with colonic tortuosity, can be seen with  constipation. Aortic Atherosclerosis (ICD10-I70.0). Electronically Signed   By: Keith Rake M.D.   On: 10/09/2020 12:40    Assessment & Plan:   Enriqueta was seen today for hypertension and hyperlipidemia.  Diagnoses and all orders for this visit:  Stage 3a chronic kidney disease ([place])- She will avoid NSAIDs.  Will try to get better control of her blood pressure. -     Basic metabolic panel; Future -     CBC with Differential/Platelet; Future -     CBC with Differential/Platelet -     Basic metabolic panel  Essential hypertension -     Basic metabolic panel; Future -     Aldosterone + renin activity w/ ratio; Future -     Cortisol; Future -     Hepatic function panel; Future -     Hepatic function panel -     Cortisol -     Aldosterone + renin activity w/ ratio -     Basic metabolic panel  Chronic pain syndrome -     methadone (DOLOPHINE) 10 MG tablet; Take 1 tablet (10 mg total) by mouth every 8 (eight) hours. -     methadone (DOLOPHINE) 5 MG tablet; Take 1 tablet (5 mg total) by mouth every 8 (eight) hours.  Cervical spinal stenosis -     methadone (DOLOPHINE) 10 MG tablet; Take 1 tablet (10 mg total) by mouth every 8 (eight) hours. -     methadone (DOLOPHINE) 5 MG tablet; Take 1 tablet (5 mg total) by mouth every 8 (eight) hours.  Encounter for long-term opiate analgesic use -     Methadone (GC/LC/MS), urine; Future -     Methadone (GC/LC/MS), urine  Long-term current use of opiate analgesic -     Urinalysis, Routine w reflex microscopic; Future -     Urine drugs of abuse scrn w alc, routine (Ref Lab); Future -     DRUG MONITOR, OPIATES,W/CONF, URINE; Future -     Methadone (GC/LC/MS), urine; Future -     Methadone (GC/LC/MS), urine -     DRUG MONITOR, OPIATES,W/CONF, URINE -     Urine drugs of abuse scrn w alc, routine (Ref Lab) -     Urinalysis, Routine w reflex microscopic  High serum cortisol- Her cortisol level is not rising. -     Cortisol; Future -      Cortisol  Hyperlipidemia with target LDL less than 70 - LDL goal achieved. Doing well on the statin  -  Lipid panel; Future -     Hepatic function panel; Future -     Hepatic function panel -     Lipid panel  Hypertriglyceridemia -     Lipid panel; Future -     Lipid panel  Malignant hypertension- Will check labs to screen for secondary causes and endorgan damage.  Will try to get better control of her blood pressure. -     carvedilol (COREG) 25 MG tablet; Take 1 tablet (25 mg total) by mouth 2 (two) times daily with a meal. -     indapamide (LOZOL) 1.25 MG tablet; Take 1 tablet (1.25 mg total) by mouth daily. -     olmesartan (BENICAR) 40 MG tablet; Take 1 tablet (40 mg total) by mouth daily.  COPD (chronic obstructive pulmonary disease) with chronic bronchitis ([place]) -     Fluticasone-Umeclidin-Vilant (TRELEGY ELLIPTA) 100-62.5-25 MCG/ACT AEPB; Inhale 1 puff into the lungs daily.  Other orders -     LDL cholesterol, direct   I have discontinued Erik Obey Mittag's Trelegy Ellipta, triamterene-hydrochlorothiazide, carvedilol, and dexamethasone. I have also changed her olmesartan. Additionally, I am having her start on carvedilol, indapamide, and Trelegy Ellipta. Lastly, I am having her maintain her cyanocobalamin, Fish Oil, Cholecalciferol, folic acid, atorvastatin, cloNIDine, methadone, and methadone.  Meds ordered this encounter  Medications   methadone (DOLOPHINE) 10 MG tablet    Sig: Take 1 tablet (10 mg total) by mouth every 8 (eight) hours.    Dispense:  90 tablet    Refill:  0   methadone (DOLOPHINE) 5 MG tablet    Sig: Take 1 tablet (5 mg total) by mouth every 8 (eight) hours.    Dispense:  90 tablet    Refill:  0   carvedilol (COREG) 25 MG tablet    Sig: Take 1 tablet (25 mg total) by mouth 2 (two) times daily with a meal.    Dispense:  180 tablet    Refill:  1   indapamide (LOZOL) 1.25 MG tablet    Sig: Take 1 tablet (1.25 mg total) by mouth daily.    Dispense:   90 tablet    Refill:  1   olmesartan (BENICAR) 40 MG tablet    Sig: Take 1 tablet (40 mg total) by mouth daily.    Dispense:  90 tablet    Refill:  1   Fluticasone-Umeclidin-Vilant (TRELEGY ELLIPTA) 100-62.5-25 MCG/ACT AEPB    Sig: Inhale 1 puff into the lungs daily.    Dispense:  120 each    Refill:  1     Follow-up: Return in about 3 months (around 04/16/2022).  Scarlette Calico, MD

## 2022-01-15 ENCOUNTER — Ambulatory Visit
Admission: RE | Admit: 2022-01-15 | Discharge: 2022-01-15 | Disposition: A | Discharge status: Home / Self Care | Payer: Medicaid Other | Source: Ambulatory Visit | Attending: Internal Medicine | Admitting: Internal Medicine

## 2022-01-15 DIAGNOSIS — Z1231 Encounter for screening mammogram for malignant neoplasm of breast: Secondary | ICD-10-CM

## 2022-01-15 MED ORDER — CARVEDILOL 25 MG PO TABS: 25.0000 mg | ORAL_TABLET | Freq: Two times a day (BID) | ORAL | 1 refills | Status: DC

## 2022-01-15 MED ORDER — INDAPAMIDE 1.25 MG PO TABS: 1.2500 mg | ORAL_TABLET | Freq: Every day | ORAL | 1 refills | Status: DC

## 2022-01-15 MED ORDER — TRELEGY ELLIPTA 100-62.5-25 MCG/ACT IN AEPB: 1.0000 | INHALATION_SPRAY | Freq: Every day | RESPIRATORY_TRACT | 1 refills | Status: DC

## 2022-01-15 MED ORDER — OLMESARTAN MEDOXOMIL 40 MG PO TABS: 40.0000 mg | ORAL_TABLET | Freq: Every day | ORAL | 1 refills | Status: DC

## 2022-01-21 LAB — URINE DRUGS OF ABUSE SCREEN W ALC, ROUTINE (REF LAB)
Amphetamines, Urine: NEGATIVE ng/mL
Barbiturate Quant, Ur: NEGATIVE ng/mL
Benzodiazepine Quant, Ur: NEGATIVE ng/mL
Cannabinoid Quant, Ur: NEGATIVE ng/mL
Cocaine (Metab.): NEGATIVE ng/mL
Ethanol, Urine: NEGATIVE %
Opiate Quant, Ur: NEGATIVE ng/mL
PCP Quant, Ur: NEGATIVE ng/mL
Propoxyphene: NEGATIVE ng/mL

## 2022-01-21 LAB — METHADONE CONFIRMATION, URINE
Methadone GC/MS Conf: 6080 ng/mL
Methadone: POSITIVE — AB

## 2022-01-21 LAB — METHADONE (GC/LC/MS), URINE

## 2022-01-27 LAB — DM TEMPLATE

## 2022-01-27 LAB — ALDOSTERONE + RENIN ACTIVITY W/ RATIO
ALDO / PRA Ratio: 44.4 Ratio — ABNORMAL HIGH (ref 0.9–28.9)
Aldosterone: 4 ng/dL
Renin Activity: 0.09 ng/mL/h — ABNORMAL LOW (ref 0.25–5.82)

## 2022-01-27 LAB — DRUG MONITOR, OPIATES,W/CONF, URINE: Opiates: NEGATIVE ng/mL (ref <100)

## 2022-02-04 ENCOUNTER — Other Ambulatory Visit: Payer: 59

## 2022-02-09 ENCOUNTER — Other Ambulatory Visit: Payer: Self-pay | Admitting: Internal Medicine

## 2022-02-09 DIAGNOSIS — G894 Chronic pain syndrome: Secondary | ICD-10-CM

## 2022-02-09 DIAGNOSIS — M4802 Spinal stenosis, cervical region: Secondary | ICD-10-CM

## 2022-02-10 NOTE — Telephone Encounter (Signed)
Check [place] registry last filled both 01/14/2022../l,mb

## 2022-02-11 ENCOUNTER — Encounter: Payer: Self-pay | Admitting: Internal Medicine

## 2022-02-11 MED ORDER — METHADONE HCL 10 MG PO TABS: 10.0000 mg | ORAL_TABLET | Freq: Three times a day (TID) | ORAL | 0 refills | Status: DC

## 2022-02-11 MED ORDER — METHADONE HCL 5 MG PO TABS: 5.0000 mg | ORAL_TABLET | Freq: Three times a day (TID) | ORAL | 0 refills | Status: DC

## 2022-02-18 ENCOUNTER — Other Ambulatory Visit: Payer: 59

## 2022-03-10 ENCOUNTER — Other Ambulatory Visit: Payer: Self-pay | Admitting: Internal Medicine

## 2022-03-10 DIAGNOSIS — M4802 Spinal stenosis, cervical region: Secondary | ICD-10-CM

## 2022-03-10 DIAGNOSIS — G894 Chronic pain syndrome: Secondary | ICD-10-CM

## 2022-03-10 MED ORDER — METHADONE HCL 10 MG PO TABS: 10.0000 mg | ORAL_TABLET | Freq: Three times a day (TID) | ORAL | 0 refills | Status: DC

## 2022-03-10 MED ORDER — METHADONE HCL 5 MG PO TABS
5.0000 mg | ORAL_TABLET | Freq: Three times a day (TID) | ORAL | 0 refills | Status: DC
Start: 2022-03-10 — End: 2022-04-07

## 2022-03-18 ENCOUNTER — Other Ambulatory Visit: Payer: Self-pay | Admitting: Internal Medicine

## 2022-03-18 ENCOUNTER — Other Ambulatory Visit: Payer: 59

## 2022-03-18 DIAGNOSIS — I10 Essential (primary) hypertension: Secondary | ICD-10-CM

## 2022-04-07 ENCOUNTER — Other Ambulatory Visit: Payer: Self-pay | Admitting: Internal Medicine

## 2022-04-07 DIAGNOSIS — M4802 Spinal stenosis, cervical region: Secondary | ICD-10-CM

## 2022-04-07 DIAGNOSIS — G894 Chronic pain syndrome: Secondary | ICD-10-CM

## 2022-04-07 MED ORDER — METHADONE HCL 5 MG PO TABS: 5.0000 mg | ORAL_TABLET | Freq: Three times a day (TID) | ORAL | 0 refills | Status: DC

## 2022-04-07 MED ORDER — METHADONE HCL 10 MG PO TABS: 10.0000 mg | ORAL_TABLET | Freq: Three times a day (TID) | ORAL | 0 refills | Status: DC

## 2022-04-15 ENCOUNTER — Other Ambulatory Visit: Payer: 59

## 2022-04-22 ENCOUNTER — Ambulatory Visit: Payer: Medicaid Other | Admitting: Internal Medicine

## 2022-04-29 ENCOUNTER — Ambulatory Visit: Payer: Medicaid Other | Admitting: Internal Medicine

## 2022-05-05 ENCOUNTER — Other Ambulatory Visit: Payer: Self-pay | Admitting: Internal Medicine

## 2022-05-05 DIAGNOSIS — M4802 Spinal stenosis, cervical region: Secondary | ICD-10-CM

## 2022-05-05 DIAGNOSIS — G894 Chronic pain syndrome: Secondary | ICD-10-CM

## 2022-05-05 MED ORDER — METHADONE HCL 5 MG PO TABS: 5.0000 mg | ORAL_TABLET | Freq: Three times a day (TID) | ORAL | 0 refills | Status: DC

## 2022-05-05 MED ORDER — METHADONE HCL 10 MG PO TABS: 10.0000 mg | ORAL_TABLET | Freq: Three times a day (TID) | ORAL | 0 refills | Status: DC

## 2022-05-06 ENCOUNTER — Other Ambulatory Visit: Payer: 59

## 2022-05-10 ENCOUNTER — Ambulatory Visit: Payer: Medicaid Other | Admitting: Internal Medicine

## 2022-05-13 ENCOUNTER — Ambulatory Visit: Payer: Medicaid Other | Admitting: Internal Medicine

## 2022-05-27 ENCOUNTER — Other Ambulatory Visit: Payer: 59

## 2022-06-01 ENCOUNTER — Encounter: Payer: Self-pay | Admitting: Internal Medicine

## 2022-06-01 ENCOUNTER — Ambulatory Visit (INDEPENDENT_AMBULATORY_CARE_PROVIDER_SITE_OTHER): Payer: Medicaid Other | Admitting: Internal Medicine

## 2022-06-01 VITALS — BP 156/100 | HR 82 | Temp 98.4°F | Ht 60.0 in | Wt 143.0 lb

## 2022-06-01 DIAGNOSIS — Z23 Encounter for immunization: Secondary | ICD-10-CM

## 2022-06-01 DIAGNOSIS — I10 Essential (primary) hypertension: Secondary | ICD-10-CM

## 2022-06-01 DIAGNOSIS — J4489 Other specified chronic obstructive pulmonary disease: Secondary | ICD-10-CM | POA: Diagnosis not present

## 2022-06-01 DIAGNOSIS — M4802 Spinal stenosis, cervical region: Secondary | ICD-10-CM

## 2022-06-01 DIAGNOSIS — G894 Chronic pain syndrome: Secondary | ICD-10-CM

## 2022-06-01 DIAGNOSIS — N1831 Chronic kidney disease, stage 3a: Secondary | ICD-10-CM

## 2022-06-01 NOTE — Patient Instructions (Signed)
Hypertension, Adult High blood pressure (hypertension) is when the force of blood pumping through the arteries is too strong. The arteries are the blood vessels that carry blood from the heart throughout the body. Hypertension forces the heart to work harder to pump blood and may cause arteries to become narrow or stiff. Untreated or uncontrolled hypertension can lead to a heart attack, heart failure, a stroke, kidney disease, and other problems. A blood pressure reading consists of a higher number over a lower number. Ideally, your blood pressure should be below 120/80. The first ("top") number is called the systolic pressure. It is a measure of the pressure in your arteries as your heart beats. The second ("bottom") number is called the diastolic pressure. It is a measure of the pressure in your arteries as the heart relaxes. What are the causes? The exact cause of this condition is not known. There are some conditions that result in high blood pressure. What increases the risk? Certain factors may make you more likely to develop high blood pressure. Some of these risk factors are under your control, including: Smoking. Not getting enough exercise or physical activity. Being overweight. Having too much fat, sugar, calories, or salt (sodium) in your diet. Drinking too much alcohol. Other risk factors include: Having a personal history of heart disease, diabetes, high cholesterol, or kidney disease. Stress. Having a family history of high blood pressure and high cholesterol. Having obstructive sleep apnea. Age. The risk increases with age. What are the signs or symptoms? High blood pressure may not cause symptoms. Very high blood pressure (hypertensive crisis) may cause: Headache. Fast or irregular heartbeats (palpitations). Shortness of breath. Nosebleed. Nausea and vomiting. Vision changes. Severe chest pain, dizziness, and seizures. How is this diagnosed? This condition is diagnosed by  measuring your blood pressure while you are seated, with your arm resting on a flat surface, your legs uncrossed, and your feet flat on the floor. The cuff of the blood pressure monitor will be placed directly against the skin of your upper arm at the level of your heart. Blood pressure should be measured at least twice using the same arm. Certain conditions can cause a difference in blood pressure between your right and left arms. If you have a high blood pressure reading during one visit or you have normal blood pressure with other risk factors, you may be asked to: Return on a different day to have your blood pressure checked again. Monitor your blood pressure at home for 1 week or longer. If you are diagnosed with hypertension, you may have other blood or imaging tests to help your health care provider understand your overall risk for other conditions. How is this treated? This condition is treated by making healthy lifestyle changes, such as eating healthy foods, exercising more, and reducing your alcohol intake. You may be referred for counseling on a healthy diet and physical activity. Your health care provider may prescribe medicine if lifestyle changes are not enough to get your blood pressure under control and if: Your systolic blood pressure is above 130. Your diastolic blood pressure is above 80. Your personal target blood pressure may vary depending on your medical conditions, your age, and other factors. Follow these instructions at home: Eating and drinking  Eat a diet that is high in fiber and potassium, and low in sodium, added sugar, and fat. An example of this eating plan is called the DASH diet. DASH stands for Dietary Approaches to Stop Hypertension. To eat this way: Eat   plenty of fresh fruits and vegetables. Try to fill one half of your plate at each meal with fruits and vegetables. Eat whole grains, such as whole-wheat pasta, brown rice, or whole-grain bread. Fill about one  fourth of your plate with whole grains. Eat or drink low-fat dairy products, such as skim milk or low-fat yogurt. Avoid fatty cuts of meat, processed or cured meats, and poultry with skin. Fill about one fourth of your plate with lean proteins, such as fish, chicken without skin, beans, eggs, or tofu. Avoid pre-made and processed foods. These tend to be higher in sodium, added sugar, and fat. Reduce your daily sodium intake. Many people with hypertension should eat less than 1,500 mg of sodium a day. Do not drink alcohol if: Your health care provider tells you not to drink. You are pregnant, may be pregnant, or are planning to become pregnant. If you drink alcohol: Limit how much you have to: 0-1 drink a day for women. 0-2 drinks a day for men. Know how much alcohol is in your drink. In the [place] one drink equals one 12 oz bottle of beer (355 mL), one 5 oz glass of wine (148 mL), or one 1 oz glass of hard liquor (44 mL). Lifestyle  Work with your health care provider to maintain a healthy body weight or to lose weight. Ask what an ideal weight is for you. Get at least 30 minutes of exercise that causes your heart to beat faster (aerobic exercise) most days of the week. Activities may include walking, swimming, or biking. Include exercise to strengthen your muscles (resistance exercise), such as Pilates or lifting weights, as part of your weekly exercise routine. Try to do these types of exercises for 30 minutes at least 3 days a week. Do not use any products that contain nicotine or tobacco. These products include cigarettes, chewing tobacco, and vaping devices, such as e-cigarettes. If you need help quitting, ask your health care provider. Monitor your blood pressure at home as told by your health care provider. Keep all follow-up visits. This is important. Medicines Take over-the-counter and prescription medicines only as told by your health care provider. Follow directions carefully. Blood  pressure medicines must be taken as prescribed. Do not skip doses of blood pressure medicine. Doing this puts you at risk for problems and can make the medicine less effective. Ask your health care provider about side effects or reactions to medicines that you should watch for. Contact a health care provider if you: Think you are having a reaction to a medicine you are taking. Have headaches that keep coming back (recurring). Feel dizzy. Have swelling in your ankles. Have trouble with your vision. Get help right away if you: Develop a severe headache or confusion. Have unusual weakness or numbness. Feel faint. Have severe pain in your chest or abdomen. Vomit repeatedly. Have trouble breathing. These symptoms may be an emergency. Get help right away. Call 911. Do not wait to see if the symptoms will go away. Do not drive yourself to the hospital. Summary Hypertension is when the force of blood pumping through your arteries is too strong. If this condition is not controlled, it may put you at risk for serious complications. Your personal target blood pressure may vary depending on your medical conditions, your age, and other factors. For most people, a normal blood pressure is less than 120/80. Hypertension is treated with lifestyle changes, medicines, or a combination of both. Lifestyle changes include losing weight, eating a healthy,   low-sodium diet, exercising more, and limiting alcohol. This information is not intended to replace advice given to you by your health care provider. Make sure you discuss any questions you have with your health care provider. Document Revised: 06/02/2021 Document Reviewed: 06/02/2021 Elsevier Patient Education  2023 [employer]  

## 2022-06-01 NOTE — Progress Notes (Signed)
Subjective:  Patient ID: Emily [name], female    DOB: Jul 05, 1966  Age: 56 y.o. MRN: [id number]  CC: Hypertension   HPI [name] presents for f/up -  She is not compliant with the diuretic.  She denies chest pain, shortness of breath, diaphoresis, or headache.  She has rare episodes of shortness of breath at rest.  She is not using the inhaler.  She tells me her pain is adequately well controlled with the current dose of methadone.  Outpatient Medications Prior to Visit  Medication Sig Dispense Refill   atorvastatin (LIPITOR) 10 MG tablet TAKE 1 TABLET(10 MG) BY MOUTH DAILY 90 tablet 1   carvedilol (COREG) 25 MG tablet Take 1 tablet (25 mg total) by mouth 2 (two) times daily with a meal. 180 tablet 1   Cholecalciferol 50 MCG (2000 UT) TABS Take 1 tablet (2,000 Units total) by mouth daily. 90 tablet 1   cloNIDine (CATAPRES - DOSED IN MG/24 HR) 0.2 mg/24hr patch APPLY 1 PATCH TOPICALLY TO THE SKIN 1 TIME A WEEK 12 patch 0   cyanocobalamin 2000 MCG tablet Take 1 tablet (2,000 mcg total) by mouth daily. 90 tablet 1   folic acid (FOLVITE) 1 MG tablet TAKE 1 TABLET(1 MG) BY MOUTH DAILY 90 tablet 0   indapamide (LOZOL) 1.25 MG tablet Take 1 tablet (1.25 mg total) by mouth daily. 90 tablet 1   olmesartan (BENICAR) 40 MG tablet Take 1 tablet (40 mg total) by mouth daily. 90 tablet 1   Omega-3 Fatty Acids (FISH OIL) 1000 MG CAPS Take 2 capsules by mouth every evening.     Fluticasone-Umeclidin-Vilant (TRELEGY ELLIPTA) 100-62.5-25 MCG/ACT AEPB Inhale 1 puff into the lungs daily. 120 each 1   methadone (DOLOPHINE) 10 MG tablet Take 1 tablet (10 mg total) by mouth every 8 (eight) hours. 90 tablet 0   methadone (DOLOPHINE) 5 MG tablet Take 1 tablet (5 mg total) by mouth every 8 (eight) hours. 90 tablet 0   No facility-administered medications prior to visit.    ROS Review of Systems  Constitutional:  Negative for diaphoresis and fatigue.  HENT: Negative.    Eyes: Negative.    Respiratory:  Positive for shortness of breath. Negative for cough, chest tightness and wheezing.   Cardiovascular:  Negative for chest pain, palpitations and leg swelling.  Gastrointestinal:  Negative for abdominal pain, constipation, diarrhea, nausea and vomiting.  Endocrine: Negative.   Genitourinary: Negative.  Negative for difficulty urinating.  Musculoskeletal:  Positive for neck pain. Negative for back pain and neck stiffness.  Neurological: Negative.  Negative for dizziness and weakness.  Hematological:  Negative for adenopathy. Does not bruise/bleed easily.  Psychiatric/Behavioral: Negative.      Objective:  BP (!) 156/100 (BP Location: Left Arm, Patient Position: Sitting, Cuff Size: Large)   Pulse 82   Temp 98.4 F (36.9 C) (Oral)   Ht 5' (1.524 m)   Wt 143 lb (64.9 kg)   SpO2 91%   BMI 27.93 kg/m   BP Readings from Last 3 Encounters:  06/01/22 (!) 156/100  01/14/22 (!) 164/98  09/17/21 (!) 142/88    Wt Readings from Last 3 Encounters:  06/01/22 143 lb (64.9 kg)  01/14/22 151 lb (68.5 kg)  09/17/21 151 lb (68.5 kg)    Physical Exam Vitals reviewed.  HENT:     Mouth/Throat:     Mouth: Mucous membranes are moist.  Eyes:     General: No scleral icterus.    Conjunctiva/sclera: Conjunctivae normal.  Cardiovascular:     Rate and Rhythm: Normal rate and regular rhythm.     Heart sounds: No murmur heard. Pulmonary:     Effort: Pulmonary effort is normal.     Breath sounds: No stridor. No wheezing, rhonchi or rales.  Abdominal:     General: Abdomen is flat.     Palpations: There is no mass.     Tenderness: There is no abdominal tenderness. There is no guarding.     Hernia: No hernia is present.  Musculoskeletal:        General: No deformity. Normal range of motion.     Cervical back: Neck supple.     Right lower leg: No edema.     Left lower leg: No edema.  Lymphadenopathy:     Cervical: No cervical adenopathy.  Skin:    General: Skin is warm.      Findings: No lesion.  Neurological:     General: No focal deficit present.     Mental Status: She is alert.  Psychiatric:        Mood and Affect: Mood normal.        Behavior: Behavior normal.     Lab Results  Component Value Date   WBC 15.0 (H) 09/17/2021   HGB 14.0 09/17/2021   HCT 43.1 09/17/2021   PLT 249.0 09/17/2021   GLUCOSE 99 01/14/2022   CHOL 153 01/14/2022   TRIG 225.0 (H) 01/14/2022   HDL 27.00 (L) 01/14/2022   LDLDIRECT 92.0 01/14/2022   LDLCALC 92 10/31/2017   ALT 5 01/14/2022   AST 15 01/14/2022   NA 139 01/14/2022   K 4.0 01/14/2022   CL 102 01/14/2022   CREATININE 1.15 01/14/2022   BUN 15 01/14/2022   CO2 28 01/14/2022   TSH 1.67 05/06/2021   INR 1.1 (H) 10/09/2020   HGBA1C 5.9 10/09/2020    MM 3D SCREEN BREAST BILATERAL  Result Date: 01/18/2022 CLINICAL DATA:  Screening. EXAM: DIGITAL SCREENING BILATERAL MAMMOGRAM WITH TOMOSYNTHESIS AND CAD TECHNIQUE: Bilateral screening digital craniocaudal and mediolateral oblique mammograms were obtained. Bilateral screening digital breast tomosynthesis was performed. The images were evaluated with computer-aided detection. COMPARISON:  None available. ACR Breast Density Category b: There are scattered areas of fibroglandular density. FINDINGS: There are no findings suspicious for malignancy. IMPRESSION: No mammographic evidence of malignancy. A result letter of this screening mammogram will be mailed directly to the patient. RECOMMENDATION: Screening mammogram in one year. (Code:SM-B-01Y) BI-RADS CATEGORY  1: Negative. Electronically Signed   By: Abelardo Diesel M.D.   On: 01/18/2022 11:17    Assessment & Plan:   Syrianna was seen today for hypertension.  Diagnoses and all orders for this visit:  Need for vaccination -     Flu Vaccine QUAD 6+ mos PF IM (Fluarix Quad PF) -     Pneumococcal conjugate vaccine 20-valent (Prevnar 20)  Chronic pain syndrome -     methadone (DOLOPHINE) 5 MG tablet; Take 1 tablet (5 mg  total) by mouth every 8 (eight) hours. -     methadone (DOLOPHINE) 10 MG tablet; Take 1 tablet (10 mg total) by mouth every 8 (eight) hours.  Cervical spinal stenosis -     methadone (DOLOPHINE) 5 MG tablet; Take 1 tablet (5 mg total) by mouth every 8 (eight) hours. -     methadone (DOLOPHINE) 10 MG tablet; Take 1 tablet (10 mg total) by mouth every 8 (eight) hours.  COPD (chronic obstructive pulmonary disease) with chronic bronchitis -  Fluticasone-Umeclidin-Vilant (TRELEGY ELLIPTA) 100-62.5-25 MCG/ACT AEPB; Inhale 1 puff into the lungs daily.  Essential hypertension- Her blood pressure is not adequately well controlled due to noncompliance.  I recommended she be more compliant with her antihypertensives.  Stage 3a chronic kidney disease ([place])   I am having Marygrace Drought maintain her cyanocobalamin, Fish Oil, Cholecalciferol, folic acid, atorvastatin, carvedilol, indapamide, olmesartan, cloNIDine, methadone, methadone, and Trelegy Ellipta.  Meds ordered this encounter  Medications   methadone (DOLOPHINE) 5 MG tablet    Sig: Take 1 tablet (5 mg total) by mouth every 8 (eight) hours.    Dispense:  90 tablet    Refill:  0   methadone (DOLOPHINE) 10 MG tablet    Sig: Take 1 tablet (10 mg total) by mouth every 8 (eight) hours.    Dispense:  90 tablet    Refill:  0   Fluticasone-Umeclidin-Vilant (TRELEGY ELLIPTA) 100-62.5-25 MCG/ACT AEPB    Sig: Inhale 1 puff into the lungs daily.    Dispense:  120 each    Refill:  1     Follow-up: Return in about 3 months (around 09/01/2022).  Scarlette Calico, MD

## 2022-06-02 MED ORDER — TRELEGY ELLIPTA 100-62.5-25 MCG/ACT IN AEPB: 1.0000 | INHALATION_SPRAY | Freq: Every day | RESPIRATORY_TRACT | 1 refills | Status: AC

## 2022-06-02 MED ORDER — METHADONE HCL 5 MG PO TABS: 5.0000 mg | ORAL_TABLET | Freq: Three times a day (TID) | ORAL | 0 refills | Status: DC

## 2022-06-02 MED ORDER — METHADONE HCL 10 MG PO TABS: 10.0000 mg | ORAL_TABLET | Freq: Three times a day (TID) | ORAL | 0 refills | Status: DC

## 2022-06-03 ENCOUNTER — Telehealth: Payer: Self-pay

## 2022-06-03 NOTE — Telephone Encounter (Signed)
Methadone 62m Key: [id number]  Methadone 188mKey: [id number]

## 2022-06-03 NOTE — Telephone Encounter (Signed)
PA's were denied  Appeal initiated.

## 2022-06-03 NOTE — Telephone Encounter (Signed)
Pharmacy tech has called for clarification on a few questions to process the appeal.  Appeal determination will be faxed with 48hrs.

## 2022-06-04 NOTE — Telephone Encounter (Signed)
Appeals has been approved   Determination sent to scan.

## 2022-06-08 ENCOUNTER — Ambulatory Visit: Payer: Medicaid Other | Admitting: Internal Medicine

## 2022-06-10 ENCOUNTER — Other Ambulatory Visit: Payer: Self-pay

## 2022-06-10 ENCOUNTER — Other Ambulatory Visit: Payer: 59

## 2022-06-11 ENCOUNTER — Other Ambulatory Visit: Payer: Self-pay | Admitting: Internal Medicine

## 2022-06-11 DIAGNOSIS — I10 Essential (primary) hypertension: Secondary | ICD-10-CM

## 2022-06-28 ENCOUNTER — Other Ambulatory Visit: Payer: Self-pay | Admitting: Internal Medicine

## 2022-06-28 DIAGNOSIS — M4802 Spinal stenosis, cervical region: Secondary | ICD-10-CM

## 2022-06-28 DIAGNOSIS — G894 Chronic pain syndrome: Secondary | ICD-10-CM

## 2022-06-29 MED ORDER — METHADONE HCL 10 MG PO TABS: 10.0000 mg | ORAL_TABLET | Freq: Three times a day (TID) | ORAL | 0 refills | Status: DC

## 2022-06-29 MED ORDER — METHADONE HCL 5 MG PO TABS: 5.0000 mg | ORAL_TABLET | Freq: Three times a day (TID) | ORAL | 0 refills | Status: DC

## 2022-07-08 ENCOUNTER — Other Ambulatory Visit: Payer: 59

## 2022-07-22 ENCOUNTER — Other Ambulatory Visit: Payer: 59

## 2022-07-27 ENCOUNTER — Other Ambulatory Visit: Payer: Self-pay | Admitting: Internal Medicine

## 2022-07-27 DIAGNOSIS — G894 Chronic pain syndrome: Secondary | ICD-10-CM

## 2022-07-27 DIAGNOSIS — M4802 Spinal stenosis, cervical region: Secondary | ICD-10-CM

## 2022-07-27 MED ORDER — METHADONE HCL 10 MG PO TABS: 10.0000 mg | ORAL_TABLET | Freq: Three times a day (TID) | ORAL | 0 refills | Status: DC

## 2022-07-27 MED ORDER — METHADONE HCL 5 MG PO TABS: 5.0000 mg | ORAL_TABLET | Freq: Three times a day (TID) | ORAL | 0 refills | Status: DC

## 2022-08-19 ENCOUNTER — Other Ambulatory Visit: Payer: 59

## 2022-08-25 ENCOUNTER — Other Ambulatory Visit: Payer: Self-pay | Admitting: Internal Medicine

## 2022-08-25 DIAGNOSIS — M4802 Spinal stenosis, cervical region: Secondary | ICD-10-CM

## 2022-08-25 DIAGNOSIS — G894 Chronic pain syndrome: Secondary | ICD-10-CM

## 2022-08-25 MED ORDER — METHADONE HCL 10 MG PO TABS: 10.0000 mg | ORAL_TABLET | Freq: Three times a day (TID) | ORAL | 0 refills | Status: DC

## 2022-08-25 MED ORDER — METHADONE HCL 5 MG PO TABS: 5.0000 mg | ORAL_TABLET | Freq: Three times a day (TID) | ORAL | 0 refills | Status: DC

## 2022-09-02 ENCOUNTER — Other Ambulatory Visit: Payer: 59

## 2022-09-09 ENCOUNTER — Ambulatory Visit: Payer: Medicaid Other | Admitting: Internal Medicine

## 2022-09-14 ENCOUNTER — Ambulatory Visit: Payer: Medicaid Other | Admitting: Internal Medicine

## 2022-09-16 ENCOUNTER — Inpatient Hospital Stay: Admission: RE | Admit: 2022-09-16 | Payer: 59 | Source: Ambulatory Visit

## 2022-09-26 ENCOUNTER — Other Ambulatory Visit: Payer: Self-pay | Admitting: Internal Medicine

## 2022-09-26 DIAGNOSIS — M4802 Spinal stenosis, cervical region: Secondary | ICD-10-CM

## 2022-09-26 DIAGNOSIS — G894 Chronic pain syndrome: Secondary | ICD-10-CM

## 2022-09-27 ENCOUNTER — Encounter: Payer: Self-pay | Admitting: Internal Medicine

## 2022-09-27 DIAGNOSIS — M4802 Spinal stenosis, cervical region: Secondary | ICD-10-CM

## 2022-09-27 DIAGNOSIS — G894 Chronic pain syndrome: Secondary | ICD-10-CM

## 2022-09-27 MED ORDER — METHADONE HCL 5 MG PO TABS: 5.0000 mg | ORAL_TABLET | Freq: Three times a day (TID) | ORAL | 0 refills | Status: DC

## 2022-09-27 MED ORDER — METHADONE HCL 10 MG PO TABS: 10.0000 mg | ORAL_TABLET | Freq: Three times a day (TID) | ORAL | 0 refills | Status: DC

## 2022-09-27 NOTE — Addendum Note (Signed)
Addended by: Binnie Rail on: 09/27/2022 09:19 PM   Modules accepted: Orders

## 2022-09-27 NOTE — Telephone Encounter (Signed)
Check [place] registry last filled 08/31/2022. MD out of office until 10/04/22 pls advise.,.Johny Chess

## 2022-10-06 ENCOUNTER — Ambulatory Visit: Payer: Medicaid Other | Admitting: Internal Medicine

## 2022-11-01 ENCOUNTER — Encounter: Payer: Self-pay | Admitting: Internal Medicine

## 2022-11-01 ENCOUNTER — Ambulatory Visit (INDEPENDENT_AMBULATORY_CARE_PROVIDER_SITE_OTHER): Payer: Medicaid Other | Admitting: Internal Medicine

## 2022-11-01 VITALS — BP 136/88 | HR 73 | Temp 98.5°F | Ht 60.0 in | Wt 138.0 lb

## 2022-11-01 DIAGNOSIS — I10 Essential (primary) hypertension: Secondary | ICD-10-CM

## 2022-11-01 DIAGNOSIS — N1831 Chronic kidney disease, stage 3a: Secondary | ICD-10-CM

## 2022-11-01 DIAGNOSIS — K760 Fatty (change of) liver, not elsewhere classified: Secondary | ICD-10-CM

## 2022-11-01 DIAGNOSIS — E785 Hyperlipidemia, unspecified: Secondary | ICD-10-CM | POA: Diagnosis not present

## 2022-11-01 DIAGNOSIS — G894 Chronic pain syndrome: Secondary | ICD-10-CM | POA: Diagnosis not present

## 2022-11-01 DIAGNOSIS — M4802 Spinal stenosis, cervical region: Secondary | ICD-10-CM

## 2022-11-01 DIAGNOSIS — D508 Other iron deficiency anemias: Secondary | ICD-10-CM | POA: Diagnosis not present

## 2022-11-01 LAB — CBC WITH DIFFERENTIAL/PLATELET
Basophils Absolute: 0.1 10*3/uL (ref 0.0–0.1)
Basophils Relative: 0.8 % (ref 0.0–3.0)
Eosinophils Absolute: 0.1 10*3/uL (ref 0.0–0.7)
Eosinophils Relative: 0.4 % (ref 0.0–5.0)
HCT: 43.3 % (ref 36.0–46.0)
Hemoglobin: 14.2 g/dL (ref 12.0–15.0)
Lymphocytes Relative: 9.7 % — ABNORMAL LOW (ref 12.0–46.0)
Lymphs Abs: 1.7 10*3/uL (ref 0.7–4.0)
MCHC: 32.9 g/dL (ref 30.0–36.0)
MCV: 86.6 fl (ref 78.0–100.0)
Monocytes Absolute: 0.9 10*3/uL (ref 0.1–1.0)
Monocytes Relative: 5.3 % (ref 3.0–12.0)
Neutro Abs: 14.7 10*3/uL — ABNORMAL HIGH (ref 1.4–7.7)
Neutrophils Relative %: 83.8 % — ABNORMAL HIGH (ref 43.0–77.0)
Platelets: 242 10*3/uL (ref 150.0–400.0)
RBC: 4.99 Mil/uL (ref 3.87–5.11)
RDW: 15.5 % (ref 11.5–15.5)
WBC: 17.5 10*3/uL — ABNORMAL HIGH (ref 4.0–10.5)

## 2022-11-01 LAB — BASIC METABOLIC PANEL
BUN: 14 mg/dL (ref 6–23)
CO2: 28 mEq/L (ref 19–32)
Calcium: 9.7 mg/dL (ref 8.4–10.5)
Chloride: 102 mEq/L (ref 96–112)
Creatinine, Ser: 0.99 mg/dL (ref 0.40–1.20)
GFR: 63.11 mL/min (ref >60.00)
Glucose, Bld: 111 mg/dL — ABNORMAL HIGH (ref 70–99)
Potassium: 4.2 mEq/L (ref 3.5–5.1)
Sodium: 137 mEq/L (ref 135–145)

## 2022-11-01 LAB — HEPATIC FUNCTION PANEL
ALT: 7 U/L (ref 0–35)
AST: 20 U/L (ref 0–37)
Albumin: 4.3 g/dL (ref 3.5–5.2)
Alkaline Phosphatase: 94 U/L (ref 39–117)
Bilirubin, Direct: 0.1 mg/dL (ref 0.0–0.3)
Total Bilirubin: 0.5 mg/dL (ref 0.2–1.2)
Total Protein: 7.6 g/dL (ref 6.0–8.3)

## 2022-11-01 LAB — LIPID PANEL
Cholesterol: 177 mg/dL (ref 0–200)
HDL: 40.8 mg/dL (ref >39.00)
LDL Cholesterol: 99 mg/dL (ref 0–99)
NonHDL: 136.12
Total CHOL/HDL Ratio: 4
Triglycerides: 185 mg/dL — ABNORMAL HIGH (ref 0.0–149.0)
VLDL: 37 mg/dL (ref 0.0–40.0)

## 2022-11-01 MED ORDER — METHADONE HCL 10 MG PO TABS: 10.0000 mg | ORAL_TABLET | Freq: Three times a day (TID) | ORAL | 0 refills | Status: DC

## 2022-11-01 MED ORDER — METHADONE HCL 5 MG PO TABS: 5.0000 mg | ORAL_TABLET | Freq: Three times a day (TID) | ORAL | 0 refills | Status: DC

## 2022-11-01 MED ORDER — CARVEDILOL 25 MG PO TABS: 25.0000 mg | ORAL_TABLET | Freq: Two times a day (BID) | ORAL | 1 refills | Status: DC

## 2022-11-01 NOTE — Progress Notes (Unsigned)
Subjective:  Patient ID: Emily [name], female    DOB: 1965/11/01  Age: 57 y.o. MRN: [id number]  CC: No chief complaint on file.   HPI ALARA BUCKLEY presents for ***  Outpatient Medications Prior to Visit  Medication Sig Dispense Refill   atorvastatin (LIPITOR) 10 MG tablet TAKE 1 TABLET(10 MG) BY MOUTH DAILY 90 tablet 1   Cholecalciferol 50 MCG (2000 UT) TABS Take 1 tablet (2,000 Units total) by mouth daily. 90 tablet 1   cloNIDine (CATAPRES - DOSED IN MG/24 HR) 0.2 mg/24hr patch APPLY 1 PATCH TOPICALLY TO THE SKIN 1 TIME A WEEK 12 patch 0   cyanocobalamin 2000 MCG tablet Take 1 tablet (2,000 mcg total) by mouth daily. 90 tablet 1   Fluticasone-Umeclidin-Vilant (TRELEGY ELLIPTA) 100-62.5-25 MCG/ACT AEPB Inhale 1 puff into the lungs daily. 123456 each 1   folic acid (FOLVITE) 1 MG tablet TAKE 1 TABLET(1 MG) BY MOUTH DAILY 90 tablet 0   olmesartan (BENICAR) 40 MG tablet Take 1 tablet (40 mg total) by mouth daily. 90 tablet 1   Omega-3 Fatty Acids (FISH OIL) 1000 MG CAPS Take 2 capsules by mouth every evening.     carvedilol (COREG) 25 MG tablet Take 1 tablet (25 mg total) by mouth 2 (two) times daily with a meal. 180 tablet 1   indapamide (LOZOL) 1.25 MG tablet Take 1 tablet (1.25 mg total) by mouth daily. 90 tablet 1   methadone (DOLOPHINE) 10 MG tablet Take 1 tablet (10 mg total) by mouth every 8 (eight) hours. 90 tablet 0   methadone (DOLOPHINE) 5 MG tablet Take 1 tablet (5 mg total) by mouth every 8 (eight) hours. 90 tablet 0   No facility-administered medications prior to visit.    ROS Review of Systems  Objective:  BP 136/88 (BP Location: Right Arm, Patient Position: Sitting, Cuff Size: Large)   Pulse 73   Temp 98.5 F (36.9 C) (Oral)   Ht 5' (1.524 m)   Wt 138 lb (62.6 kg)   SpO2 95%   BMI 26.95 kg/m   BP Readings from Last 3 Encounters:  11/01/22 136/88  06/01/22 (!) 156/100  01/14/22 (!) 164/98    Wt Readings from Last 3 Encounters:  11/01/22 138 lb (62.6  kg)  06/01/22 143 lb (64.9 kg)  01/14/22 151 lb (68.5 kg)    Physical Exam  Lab Results  Component Value Date   WBC 15.0 (H) 09/17/2021   HGB 14.0 09/17/2021   HCT 43.1 09/17/2021   PLT 249.0 09/17/2021   GLUCOSE 99 01/14/2022   CHOL 153 01/14/2022   TRIG 225.0 (H) 01/14/2022   HDL 27.00 (L) 01/14/2022   LDLDIRECT 92.0 01/14/2022   LDLCALC 92 10/31/2017   ALT 5 01/14/2022   AST 15 01/14/2022   NA 139 01/14/2022   K 4.0 01/14/2022   CL 102 01/14/2022   CREATININE 1.15 01/14/2022   BUN 15 01/14/2022   CO2 28 01/14/2022   TSH 1.67 05/06/2021   INR 1.1 (H) 10/09/2020   HGBA1C 5.9 10/09/2020    MM 3D SCREEN BREAST BILATERAL  Result Date: 01/18/2022 CLINICAL DATA:  Screening. EXAM: DIGITAL SCREENING BILATERAL MAMMOGRAM WITH TOMOSYNTHESIS AND CAD TECHNIQUE: Bilateral screening digital craniocaudal and mediolateral oblique mammograms were obtained. Bilateral screening digital breast tomosynthesis was performed. The images were evaluated with computer-aided detection. COMPARISON:  None available. ACR Breast Density Category b: There are scattered areas of fibroglandular density. FINDINGS: There are no findings suspicious for malignancy. IMPRESSION: No mammographic evidence  of malignancy. A result letter of this screening mammogram will be mailed directly to the patient. RECOMMENDATION: Screening mammogram in one year. (Code:SM-B-01Y) BI-RADS CATEGORY  1: Negative. Electronically Signed   By: Abelardo Diesel M.D.   On: 01/18/2022 11:17    Assessment & Plan:  Iron deficiency anemia secondary to inadequate dietary iron intake  Malignant hypertension -     Carvedilol; Take 1 tablet (25 mg total) by mouth 2 (two) times daily with a meal.  Dispense: 180 tablet; Refill: 1 -     TSH; Future -     Hepatic function panel; Future -     CBC with Differential/Platelet; Future -     Basic metabolic panel; Future  Chronic pain syndrome -     Methadone HCl; Take 1 tablet (5 mg total) by mouth  every 8 (eight) hours.  Dispense: 90 tablet; Refill: 0 -     Methadone HCl; Take 1 tablet (10 mg total) by mouth every 8 (eight) hours.  Dispense: 90 tablet; Refill: 0  Cervical spinal stenosis -     Methadone HCl; Take 1 tablet (5 mg total) by mouth every 8 (eight) hours.  Dispense: 90 tablet; Refill: 0 -     Methadone HCl; Take 1 tablet (10 mg total) by mouth every 8 (eight) hours.  Dispense: 90 tablet; Refill: 0  Essential hypertension  Fatty liver -     Hepatic function panel; Future  Hyperlipidemia with target LDL less than 70 -     Lipid panel; Future -     TSH; Future -     Hepatic function panel; Future     Follow-up: No follow-ups on file.  Scarlette Calico, MD

## 2022-11-01 NOTE — Patient Instructions (Signed)

## 2022-11-02 ENCOUNTER — Other Ambulatory Visit: Payer: Self-pay | Admitting: Internal Medicine

## 2022-11-02 DIAGNOSIS — I10 Essential (primary) hypertension: Secondary | ICD-10-CM

## 2022-11-02 LAB — TSH: TSH: 2.23 u[IU]/mL (ref 0.35–5.50)

## 2022-11-02 MED ORDER — CLONIDINE 0.2 MG/24HR TD PTWK: MEDICATED_PATCH | TRANSDERMAL | 0 refills | Status: DC

## 2022-11-05 ENCOUNTER — Other Ambulatory Visit (HOSPITAL_COMMUNITY): Payer: Self-pay

## 2022-11-05 ENCOUNTER — Telehealth: Payer: Self-pay

## 2022-11-05 NOTE — Telephone Encounter (Signed)
Patient Advocate Encounter  Received a fax from [facility] regarding Prior Authorization for Methadone HCI 5MG  tablets.   Authorization has been DENIED due to

## 2022-11-05 NOTE — Telephone Encounter (Signed)
Patient Advocate Encounter   Received notification from [facility] that prior authorization is required for Methadone HCl 5MG  tablets  Submitted: 11-05-2022 Key  [id number]  Status is pending

## 2022-11-09 ENCOUNTER — Telehealth: Payer: Self-pay

## 2022-11-09 NOTE — Telephone Encounter (Signed)
Pharmacy Patient Advocate Encounter  Received notification from [facility] that the request for prior authorization for Methadone 10mg  has been denied due to The following criteria from the health plan guideline, Opioid Analgesics, must be met before we can approve  this request. Please send [place] supporting chart notes and lab results. 1. The prescribing clinician shall review the [facility] statement on use of controlled  substances for the treatment of pain (https://www.ncmedboard.org/resourcesinformation/professionalresources/laws-rules-positionstatements/positionstatements/Policy_for_the_use_of_opiates_for_the_treatment_of_pain) and is adhering as medically appropriate to the guidelines which include: (a) complete beneficiary evaluation, (b)  establishment of a treatment plan contract), (c) informed consent,(d) periodic review, and (e) consultation with specialists in various treatment modalities as appropriate. 2. The prescribing clinician shall check the beneficiary's utilization of controlled substances on the [place] Controlled Substance Reporting System. (https://[email or web address].https://www.castaneda-barker.net/). 3. The prescribing clinician shall review the CDC Guideline for Prescribing Opioids for Chronic Pain -- [place], 2016.    Please be advised we currently do not have a Pharmacist to review denials, therefore you will need to process appeals accordingly as needed. Thanks for your support at this time.   You may call [phone]  to appeal.   Denial letter attached to chart

## 2022-11-12 IMAGING — MG MM DIGITAL SCREENING BILAT W/ TOMO AND CAD
8 series · 8 of 24 positions shown · non-contrast
Comparison: None available.

CLINICAL DATA: Screening.

EXAM:
DIGITAL SCREENING BILATERAL MAMMOGRAM WITH TOMOSYNTHESIS AND CAD
TECHNIQUE: Bilateral screening digital craniocaudal and mediolateral oblique
mammograms were obtained. Bilateral screening digital breast
tomosynthesis was performed. The images were evaluated with
computer-aided detection.

[L MLO synth-2D]
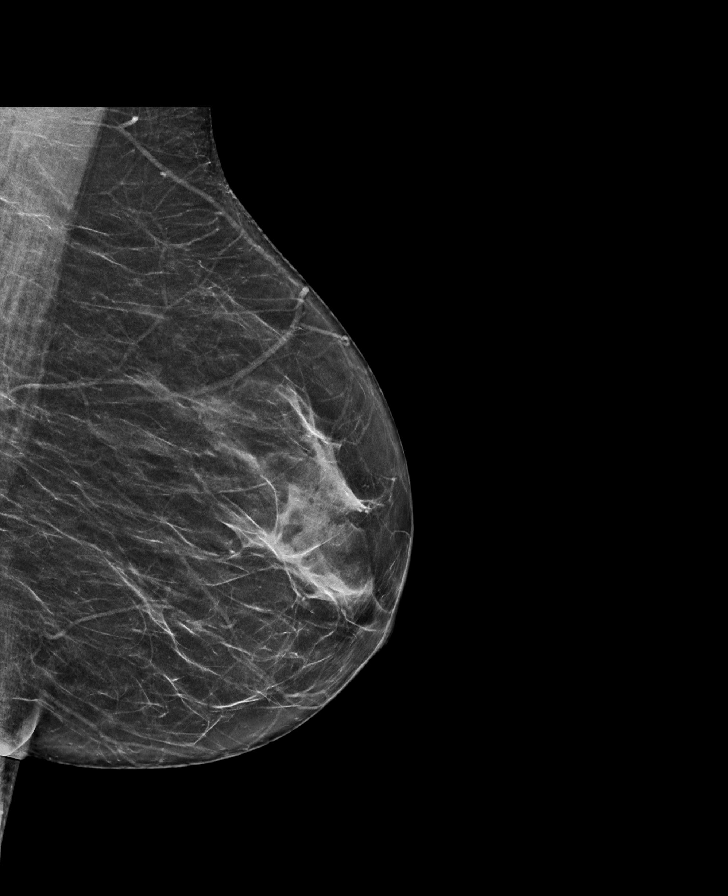

[R CC synth-2D]
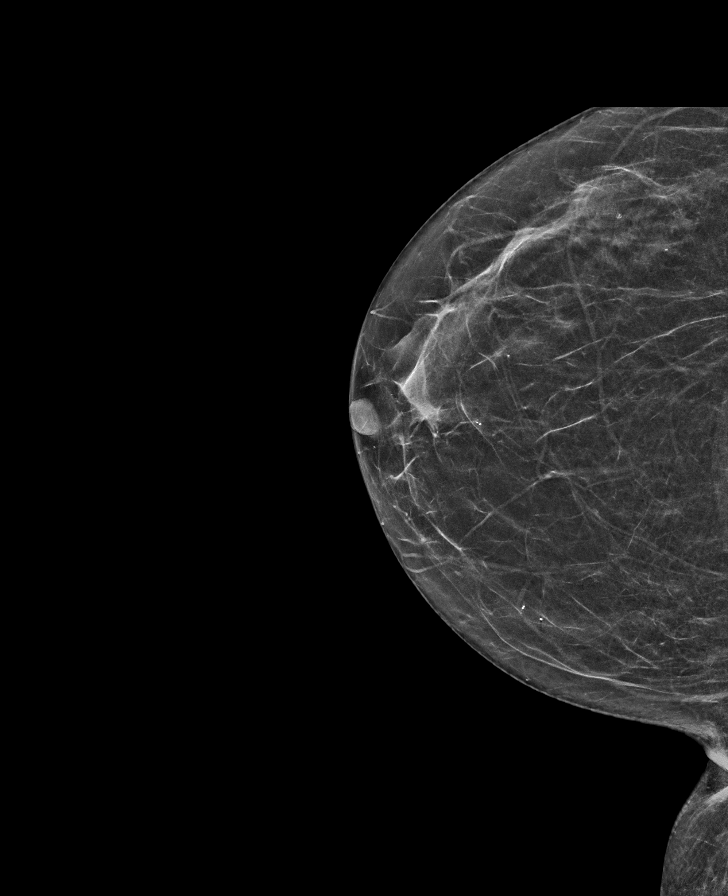

[R MLO synth-2D]
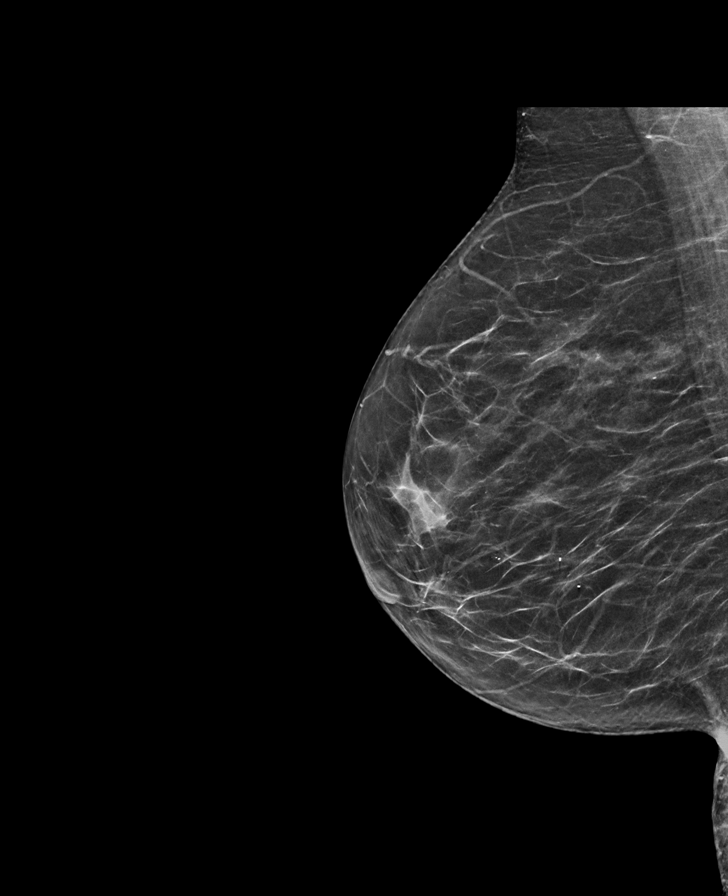

[L CC synth-2D]
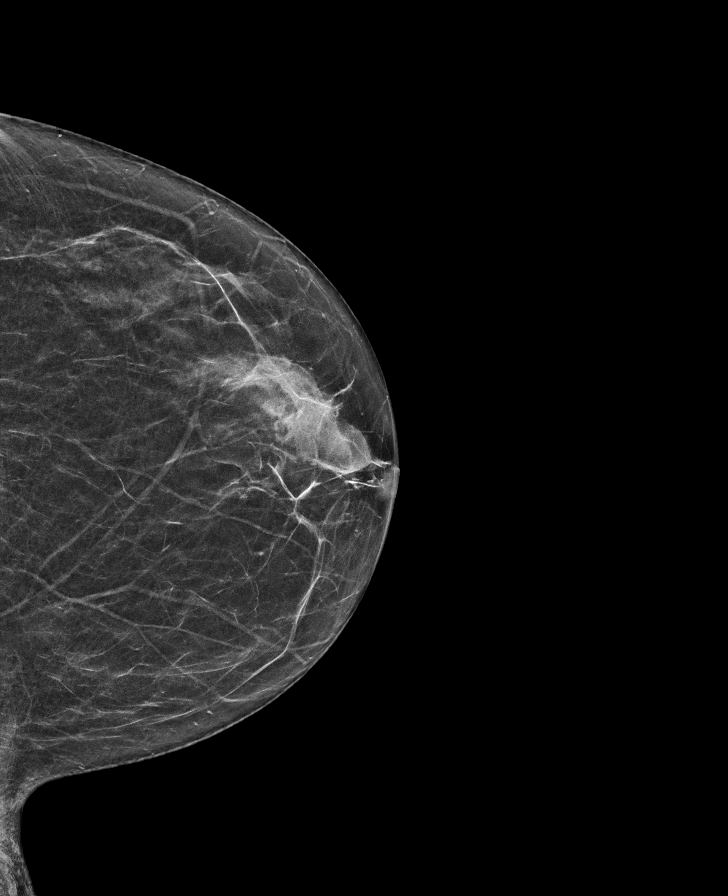

[L CC tomo · tomo slice 29/58.0]
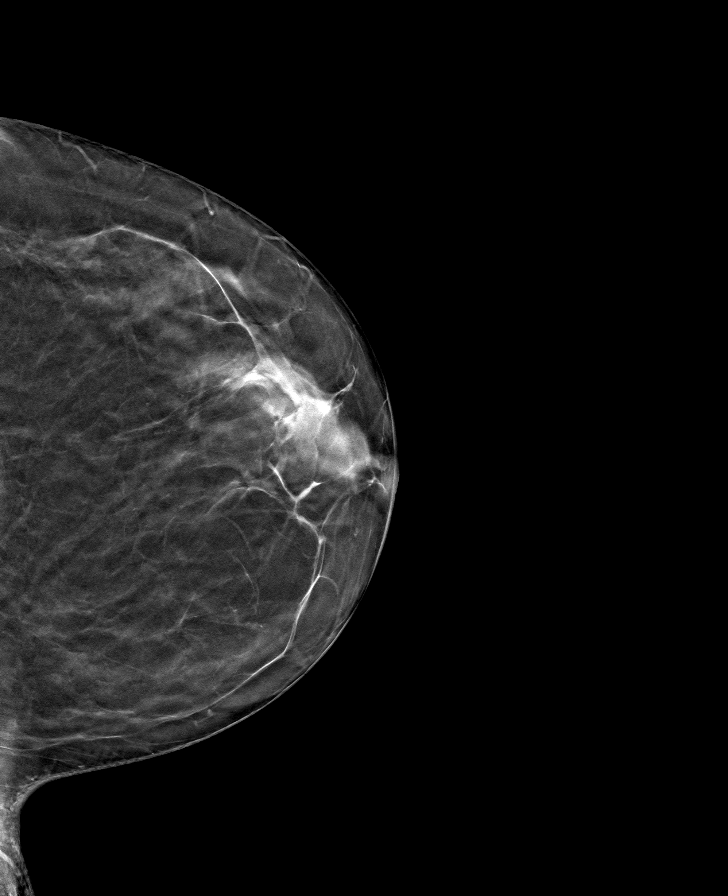

[R CC tomo · tomo slice 29/56.0]
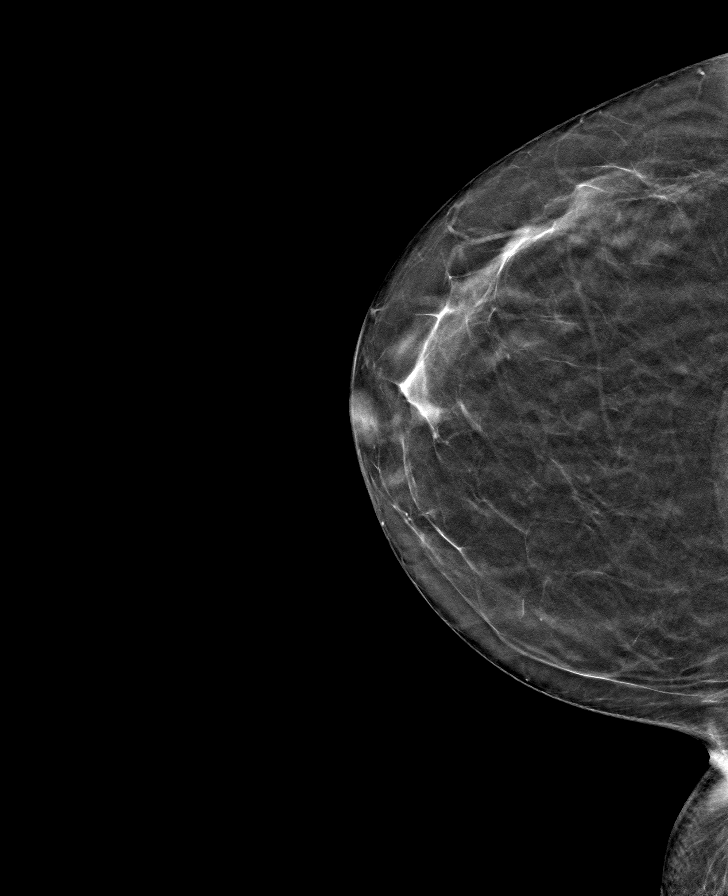

[L MLO tomo · tomo slice 33/65.0]
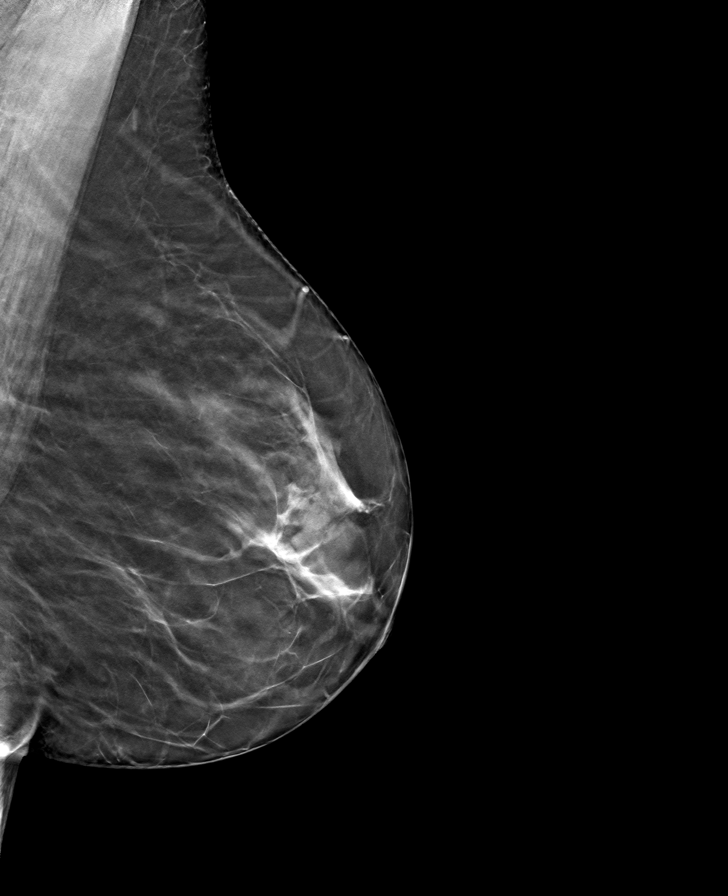

[R MLO tomo · tomo slice 31/61.0]
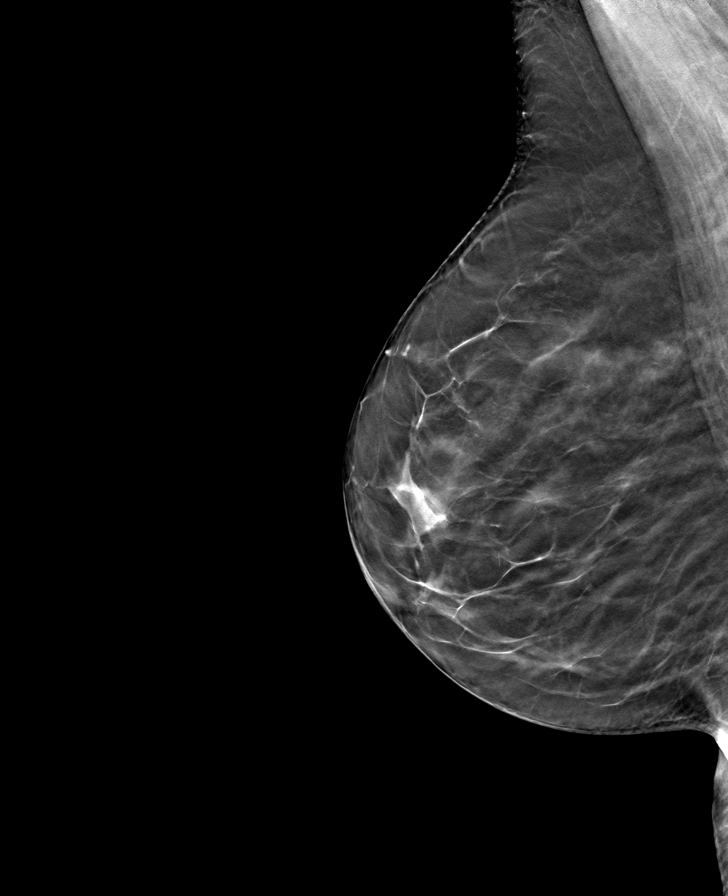

[8 of 24 positions shown; findings below may reference images not displayed]

ACR Breast Density Category b: There are scattered areas of
fibroglandular density.
FINDINGS: There are no findings suspicious for malignancy.
IMPRESSION: No mammographic evidence of malignancy. A result letter of this
screening mammogram will be mailed directly to the patient.

RECOMMENDATION:
Screening mammogram in one year. (Code:GB-Z-FIU)

BI-RADS CATEGORY  1: Negative.

## 2022-11-28 ENCOUNTER — Other Ambulatory Visit: Payer: Self-pay | Admitting: Internal Medicine

## 2022-11-28 DIAGNOSIS — G894 Chronic pain syndrome: Secondary | ICD-10-CM

## 2022-11-28 DIAGNOSIS — M4802 Spinal stenosis, cervical region: Secondary | ICD-10-CM

## 2022-11-29 MED ORDER — METHADONE HCL 5 MG PO TABS: 5.0000 mg | ORAL_TABLET | Freq: Three times a day (TID) | ORAL | 0 refills | Status: DC

## 2022-11-29 MED ORDER — METHADONE HCL 10 MG PO TABS: 10.0000 mg | ORAL_TABLET | Freq: Three times a day (TID) | ORAL | 0 refills | Status: DC

## 2022-12-27 ENCOUNTER — Other Ambulatory Visit: Payer: Self-pay | Admitting: Internal Medicine

## 2022-12-27 DIAGNOSIS — M4802 Spinal stenosis, cervical region: Secondary | ICD-10-CM

## 2022-12-27 DIAGNOSIS — G894 Chronic pain syndrome: Secondary | ICD-10-CM

## 2022-12-27 MED ORDER — METHADONE HCL 5 MG PO TABS: 5.0000 mg | ORAL_TABLET | Freq: Three times a day (TID) | ORAL | 0 refills | Status: DC

## 2022-12-27 MED ORDER — METHADONE HCL 10 MG PO TABS: 10.0000 mg | ORAL_TABLET | Freq: Three times a day (TID) | ORAL | 0 refills | Status: DC

## 2023-01-05 ENCOUNTER — Other Ambulatory Visit (HOSPITAL_COMMUNITY): Payer: Self-pay

## 2023-01-05 ENCOUNTER — Other Ambulatory Visit: Payer: Self-pay

## 2023-01-24 ENCOUNTER — Other Ambulatory Visit: Payer: Self-pay | Admitting: Internal Medicine

## 2023-01-24 DIAGNOSIS — M4802 Spinal stenosis, cervical region: Secondary | ICD-10-CM

## 2023-01-24 DIAGNOSIS — G894 Chronic pain syndrome: Secondary | ICD-10-CM

## 2023-01-24 MED ORDER — METHADONE HCL 10 MG PO TABS: 10.0000 mg | ORAL_TABLET | Freq: Three times a day (TID) | ORAL | 0 refills | Status: DC

## 2023-01-24 MED ORDER — METHADONE HCL 5 MG PO TABS: 5.0000 mg | ORAL_TABLET | Freq: Three times a day (TID) | ORAL | 0 refills | Status: DC

## 2023-01-27 ENCOUNTER — Other Ambulatory Visit: Payer: Self-pay | Admitting: Internal Medicine

## 2023-01-27 DIAGNOSIS — I10 Essential (primary) hypertension: Secondary | ICD-10-CM

## 2023-01-27 DIAGNOSIS — N1831 Chronic kidney disease, stage 3a: Secondary | ICD-10-CM

## 2023-02-16 ENCOUNTER — Ambulatory Visit: Payer: Medicaid Other | Admitting: Internal Medicine

## 2023-02-21 ENCOUNTER — Other Ambulatory Visit: Payer: Self-pay | Admitting: Internal Medicine

## 2023-02-21 DIAGNOSIS — G894 Chronic pain syndrome: Secondary | ICD-10-CM

## 2023-02-21 DIAGNOSIS — M4802 Spinal stenosis, cervical region: Secondary | ICD-10-CM

## 2023-02-21 MED ORDER — METHADONE HCL 5 MG PO TABS: 5.0000 mg | ORAL_TABLET | Freq: Three times a day (TID) | ORAL | 0 refills | Status: DC

## 2023-02-21 MED ORDER — METHADONE HCL 10 MG PO TABS: 10.0000 mg | ORAL_TABLET | Freq: Three times a day (TID) | ORAL | 0 refills | Status: DC

## 2023-03-03 ENCOUNTER — Ambulatory Visit: Payer: Medicaid Other | Admitting: Internal Medicine

## 2023-03-09 ENCOUNTER — Encounter (INDEPENDENT_AMBULATORY_CARE_PROVIDER_SITE_OTHER): Payer: Self-pay

## 2023-03-22 ENCOUNTER — Ambulatory Visit: Payer: Medicaid Other | Admitting: Internal Medicine

## 2023-03-22 ENCOUNTER — Encounter: Payer: Self-pay | Admitting: Internal Medicine

## 2023-03-22 VITALS — BP 160/100 | HR 75 | Temp 98.1°F | Ht 60.0 in | Wt 138.0 lb

## 2023-03-22 DIAGNOSIS — Z79891 Long term (current) use of opiate analgesic: Secondary | ICD-10-CM

## 2023-03-22 DIAGNOSIS — Z23 Encounter for immunization: Secondary | ICD-10-CM | POA: Insufficient documentation

## 2023-03-22 DIAGNOSIS — M4802 Spinal stenosis, cervical region: Secondary | ICD-10-CM

## 2023-03-22 DIAGNOSIS — G894 Chronic pain syndrome: Secondary | ICD-10-CM

## 2023-03-22 DIAGNOSIS — N1831 Chronic kidney disease, stage 3a: Secondary | ICD-10-CM | POA: Diagnosis not present

## 2023-03-22 DIAGNOSIS — I10 Essential (primary) hypertension: Secondary | ICD-10-CM

## 2023-03-22 DIAGNOSIS — R9431 Abnormal electrocardiogram [ECG] [EKG]: Secondary | ICD-10-CM | POA: Diagnosis not present

## 2023-03-22 DIAGNOSIS — R7989 Other specified abnormal findings of blood chemistry: Secondary | ICD-10-CM

## 2023-03-22 DIAGNOSIS — Z124 Encounter for screening for malignant neoplasm of cervix: Secondary | ICD-10-CM | POA: Insufficient documentation

## 2023-03-22 DIAGNOSIS — Z1231 Encounter for screening mammogram for malignant neoplasm of breast: Secondary | ICD-10-CM | POA: Insufficient documentation

## 2023-03-22 LAB — URINALYSIS, ROUTINE W REFLEX MICROSCOPIC
Bilirubin Urine: NEGATIVE
Ketones, ur: NEGATIVE
Leukocytes,Ua: NEGATIVE
Nitrite: NEGATIVE
Specific Gravity, Urine: 1.01 (ref 1.000–1.030)
Total Protein, Urine: NEGATIVE
Urine Glucose: NEGATIVE
Urobilinogen, UA: 0.2 (ref 0.0–1.0)
pH: 7.5 (ref 5.0–8.0)

## 2023-03-22 LAB — BRAIN NATRIURETIC PEPTIDE: Pro B Natriuretic peptide (BNP): 295 pg/mL — ABNORMAL HIGH (ref 0.0–100.0)

## 2023-03-22 LAB — BASIC METABOLIC PANEL
BUN: 13 mg/dL (ref 6–23)
CO2: 28 mEq/L (ref 19–32)
Calcium: 10.1 mg/dL (ref 8.4–10.5)
Chloride: 102 mEq/L (ref 96–112)
Creatinine, Ser: 1.03 mg/dL (ref 0.40–1.20)
GFR: 60.01 mL/min (ref >60.00)
Glucose, Bld: 109 mg/dL — ABNORMAL HIGH (ref 70–99)
Potassium: 4.4 mEq/L (ref 3.5–5.1)
Sodium: 139 mEq/L (ref 135–145)

## 2023-03-22 LAB — TROPONIN I (HIGH SENSITIVITY): High Sens Troponin I: 6 ng/L (ref 2–17)

## 2023-03-22 MED ORDER — SHINGRIX 50 MCG/0.5ML IM SUSR
0.5000 mL | Freq: Once | INTRAMUSCULAR | 1 refills | Status: AC
Start: 2023-03-22 — End: 2023-03-22

## 2023-03-22 NOTE — Progress Notes (Unsigned)
Subjective:  Patient ID: Emily [name], female    DOB: 1965/09/30  Age: 57 y.o. MRN: [id number]  CC: Hypertension   HPI Emily [name] presents for f/up ----  Discussed the use of AI scribe software for clinical note transcription with the patient, who gave verbal consent to proceed.  History of Present Illness   The patient presents with elevated blood pressure, which she noticed and associated with feelings of anxiety. She denies experiencing any headaches, chest pain, or shortness of breath. The patient has been under significant stress due to difficulties in finding a new place to live, which has been occupying her thoughts constantly and affecting her sleep. She continues to struggle with medication compliance.  The patient has a history of lung issues but denies any current breathing difficulties and is not using any inhalers. She is due for a mammogram and has requested an order for the same.  The patient has been taking prescribed medications but ran out of one of them recently, which she believes could be contributing to her current symptoms. She is also a smoker.  The patient has a history of constipation, which she reports as being the same as usual. She also reports frequent urination.       Outpatient Medications Prior to Visit  Medication Sig Dispense Refill   atorvastatin (LIPITOR) 10 MG tablet TAKE 1 TABLET(10 MG) BY MOUTH DAILY 90 tablet 1   carvedilol (COREG) 25 MG tablet Take 1 tablet (25 mg total) by mouth 2 (two) times daily with a meal. 180 tablet 1   Cholecalciferol 50 MCG (2000 UT) TABS Take 1 tablet (2,000 Units total) by mouth daily. 90 tablet 1   cloNIDine (CATAPRES - DOSED IN MG/24 HR) 0.2 mg/24hr patch APPLY 1 PATCH TOPICALLY TO THE SKIN 1 TIME A WEEK 12 patch 0   cyanocobalamin 2000 MCG tablet Take 1 tablet (2,000 mcg total) by mouth daily. 90 tablet 1   Fluticasone-Umeclidin-Vilant (TRELEGY ELLIPTA) 100-62.5-25 MCG/ACT AEPB Inhale 1 puff into the  lungs daily. 120 each 1   folic acid (FOLVITE) 1 MG tablet TAKE 1 TABLET(1 MG) BY MOUTH DAILY 90 tablet 0   methadone (DOLOPHINE) 10 MG tablet Take 1 tablet (10 mg total) by mouth every 8 (eight) hours. 90 tablet 0   methadone (DOLOPHINE) 5 MG tablet Take 1 tablet (5 mg total) by mouth every 8 (eight) hours. 90 tablet 0   olmesartan (BENICAR) 40 MG tablet Take 1 tablet (40 mg total) by mouth daily. 90 tablet 1   Omega-3 Fatty Acids (FISH OIL) 1000 MG CAPS Take 2 capsules by mouth every evening.     No facility-administered medications prior to visit.    ROS Review of Systems  Objective:  BP (!) 160/100 (BP Location: Left Arm, Patient Position: Sitting, Cuff Size: Large)   Pulse 75   Temp 98.1 F (36.7 C) (Oral)   Ht 5' (1.524 m)   Wt 138 lb (62.6 kg)   SpO2 98%   BMI 26.95 kg/m   BP Readings from Last 3 Encounters:  03/22/23 (!) 160/100  11/01/22 136/88  06/01/22 (!) 156/100    Wt Readings from Last 3 Encounters:  03/22/23 138 lb (62.6 kg)  11/01/22 138 lb (62.6 kg)  06/01/22 143 lb (64.9 kg)    Physical Exam Constitutional:      General: She is not in acute distress.    Appearance: She is ill-appearing. She is not toxic-appearing or diaphoretic.  HENT:  Mouth/Throat:     Mouth: Mucous membranes are moist.  Eyes:     General: No scleral icterus.    Conjunctiva/sclera: Conjunctivae normal.  Cardiovascular:     Rate and Rhythm: Regular rhythm. Bradycardia present.     Pulses: Normal pulses.     Heart sounds: No murmur heard.    No friction rub. No gallop.     Comments: EKG- SB, 69 bpm LAE ++++artifact NS ST T wave changes Unchanged Pulmonary:     Effort: Pulmonary effort is normal. No tachypnea.     Breath sounds: No stridor. Examination of the right-middle field reveals rhonchi. Rhonchi present. No decreased breath sounds, wheezing or rales.  Abdominal:     General: Abdomen is flat.     Palpations: There is no mass.     Tenderness: There is no  abdominal tenderness. There is no guarding.     Hernia: No hernia is present.  Musculoskeletal:        General: Normal range of motion.     Cervical back: Neck supple.     Right lower leg: No edema.     Left lower leg: No edema.  Skin:    General: Skin is warm and dry.     Findings: No lesion.  Neurological:     General: No focal deficit present.     Mental Status: She is alert. Mental status is at baseline.  Psychiatric:        Mood and Affect: Mood normal.        Behavior: Behavior normal.     Lab Results  Component Value Date   WBC 17.5 (H) 11/01/2022   HGB 14.2 11/01/2022   HCT 43.3 11/01/2022   PLT 242.0 11/01/2022   GLUCOSE 111 (H) 11/01/2022   CHOL 177 11/01/2022   TRIG 185.0 (H) 11/01/2022   HDL 40.80 11/01/2022   LDLDIRECT 92.0 01/14/2022   LDLCALC 99 11/01/2022   ALT 7 11/01/2022   AST 20 11/01/2022   NA 137 11/01/2022   K 4.2 11/01/2022   CL 102 11/01/2022   CREATININE 0.99 11/01/2022   BUN 14 11/01/2022   CO2 28 11/01/2022   TSH 2.23 11/01/2022   INR 1.1 (H) 10/09/2020   HGBA1C 5.9 10/09/2020    MM 3D SCREEN BREAST BILATERAL  Result Date: 01/18/2022 CLINICAL DATA:  Screening. EXAM: DIGITAL SCREENING BILATERAL MAMMOGRAM WITH TOMOSYNTHESIS AND CAD TECHNIQUE: Bilateral screening digital craniocaudal and mediolateral oblique mammograms were obtained. Bilateral screening digital breast tomosynthesis was performed. The images were evaluated with computer-aided detection. COMPARISON:  None available. ACR Breast Density Category b: There are scattered areas of fibroglandular density. FINDINGS: There are no findings suspicious for malignancy. IMPRESSION: No mammographic evidence of malignancy. A result letter of this screening mammogram will be mailed directly to the patient. RECOMMENDATION: Screening mammogram in one year. (Code:SM-B-01Y) BI-RADS CATEGORY  1: Negative. Electronically Signed   By: Sherian Rein M.D.   On: 01/18/2022 11:17    Assessment & Plan:   Essential hypertension -     Urine drugs of abuse scrn w alc, routine (Ref Lab); Future -     Basic metabolic panel; Future -     CBC with Differential/Platelet; Future -     Urinalysis, Routine w reflex microscopic; Future -     EKG 12-Lead -     Aldosterone + renin activity w/ ratio; Future  Long-term current use of opiate analgesic -     Urine drugs of abuse scrn w alc, routine (Ref  Lab); Future -     DRUG MONITOR, OPIATES,W/CONF, URINE; Future  Stage 3a chronic kidney disease (HCC) -     Basic metabolic panel; Future -     CBC with Differential/Platelet; Future -     Urinalysis, Routine w reflex microscopic; Future -     Aldosterone + renin activity w/ ratio; Future  Screening mammogram for breast cancer -     Digital Screening Mammogram, Left and Right; Future  Cervical cancer screening -     Ambulatory referral to Gynecology  Need for varicella vaccine -     Shingrix; Inject 0.5 mLs into the muscle once for 1 dose.  Dispense: 0.5 mL; Refill: 1  Abnormal electrocardiogram (ECG) (EKG) -     Brain natriuretic peptide; Future -     Troponin I (High Sensitivity); Future     Follow-up: Return in about 4 months (around 07/22/2023).  Sanda Linger, MD

## 2023-03-22 NOTE — Patient Instructions (Signed)
Hypertension, Adult High blood pressure (hypertension) is when the force of blood pumping through the arteries is too strong. The arteries are the blood vessels that carry blood from the heart throughout the body. Hypertension forces the heart to work harder to pump blood and may cause arteries to become narrow or stiff. Untreated or uncontrolled hypertension can lead to a heart attack, heart failure, a stroke, kidney disease, and other problems. A blood pressure reading consists of a higher number over a lower number. Ideally, your blood pressure should be below 120/80. The first ("top") number is called the systolic pressure. It is a measure of the pressure in your arteries as your heart beats. The second ("bottom") number is called the diastolic pressure. It is a measure of the pressure in your arteries as the heart relaxes. What are the causes? The exact cause of this condition is not known. There are some conditions that result in high blood pressure. What increases the risk? Certain factors may make you more likely to develop high blood pressure. Some of these risk factors are under your control, including: Smoking. Not getting enough exercise or physical activity. Being overweight. Having too much fat, sugar, calories, or salt (sodium) in your diet. Drinking too much alcohol. Other risk factors include: Having a personal history of heart disease, diabetes, high cholesterol, or kidney disease. Stress. Having a family history of high blood pressure and high cholesterol. Having obstructive sleep apnea. Age. The risk increases with age. What are the signs or symptoms? High blood pressure may not cause symptoms. Very high blood pressure (hypertensive crisis) may cause: Headache. Fast or irregular heartbeats (palpitations). Shortness of breath. Nosebleed. Nausea and vomiting. Vision changes. Severe chest pain, dizziness, and seizures. How is this diagnosed? This condition is diagnosed by  measuring your blood pressure while you are seated, with your arm resting on a flat surface, your legs uncrossed, and your feet flat on the floor. The cuff of the blood pressure monitor will be placed directly against the skin of your upper arm at the level of your heart. Blood pressure should be measured at least twice using the same arm. Certain conditions can cause a difference in blood pressure between your right and left arms. If you have a high blood pressure reading during one visit or you have normal blood pressure with other risk factors, you may be asked to: Return on a different day to have your blood pressure checked again. Monitor your blood pressure at home for 1 week or longer. If you are diagnosed with hypertension, you may have other blood or imaging tests to help your health care provider understand your overall risk for other conditions. How is this treated? This condition is treated by making healthy lifestyle changes, such as eating healthy foods, exercising more, and reducing your alcohol intake. You may be referred for counseling on a healthy diet and physical activity. Your health care provider may prescribe medicine if lifestyle changes are not enough to get your blood pressure under control and if: Your systolic blood pressure is above 130. Your diastolic blood pressure is above 80. Your personal target blood pressure may vary depending on your medical conditions, your age, and other factors. Follow these instructions at home: Eating and drinking  Eat a diet that is high in fiber and potassium, and low in sodium, added sugar, and fat. An example of this eating plan is called the DASH diet. DASH stands for Dietary Approaches to Stop Hypertension. To eat this way: Eat   plenty of fresh fruits and vegetables. Try to fill one half of your plate at each meal with fruits and vegetables. Eat whole grains, such as whole-wheat pasta, brown rice, or whole-grain bread. Fill about one  fourth of your plate with whole grains. Eat or drink low-fat dairy products, such as skim milk or low-fat yogurt. Avoid fatty cuts of meat, processed or cured meats, and poultry with skin. Fill about one fourth of your plate with lean proteins, such as fish, chicken without skin, beans, eggs, or tofu. Avoid pre-made and processed foods. These tend to be higher in sodium, added sugar, and fat. Reduce your daily sodium intake. Many people with hypertension should eat less than 1,500 mg of sodium a day. Do not drink alcohol if: Your health care provider tells you not to drink. You are pregnant, may be pregnant, or are planning to become pregnant. If you drink alcohol: Limit how much you have to: 0-1 drink a day for women. 0-2 drinks a day for men. Know how much alcohol is in your drink. In the [place] one drink equals one 12 oz bottle of beer (355 mL), one 5 oz glass of wine (148 mL), or one 1 oz glass of hard liquor (44 mL). Lifestyle  Work with your health care provider to maintain a healthy body weight or to lose weight. Ask what an ideal weight is for you. Get at least 30 minutes of exercise that causes your heart to beat faster (aerobic exercise) most days of the week. Activities may include walking, swimming, or biking. Include exercise to strengthen your muscles (resistance exercise), such as Pilates or lifting weights, as part of your weekly exercise routine. Try to do these types of exercises for 30 minutes at least 3 days a week. Do not use any products that contain nicotine or tobacco. These products include cigarettes, chewing tobacco, and vaping devices, such as e-cigarettes. If you need help quitting, ask your health care provider. Monitor your blood pressure at home as told by your health care provider. Keep all follow-up visits. This is important. Medicines Take over-the-counter and prescription medicines only as told by your health care provider. Follow directions carefully. Blood  pressure medicines must be taken as prescribed. Do not skip doses of blood pressure medicine. Doing this puts you at risk for problems and can make the medicine less effective. Ask your health care provider about side effects or reactions to medicines that you should watch for. Contact a health care provider if you: Think you are having a reaction to a medicine you are taking. Have headaches that keep coming back (recurring). Feel dizzy. Have swelling in your ankles. Have trouble with your vision. Get help right away if you: Develop a severe headache or confusion. Have unusual weakness or numbness. Feel faint. Have severe pain in your chest or abdomen. Vomit repeatedly. Have trouble breathing. These symptoms may be an emergency. Get help right away. Call 911. Do not wait to see if the symptoms will go away. Do not drive yourself to the hospital. Summary Hypertension is when the force of blood pumping through your arteries is too strong. If this condition is not controlled, it may put you at risk for serious complications. Your personal target blood pressure may vary depending on your medical conditions, your age, and other factors. For most people, a normal blood pressure is less than 120/80. Hypertension is treated with lifestyle changes, medicines, or a combination of both. Lifestyle changes include losing weight, eating a healthy,   low-sodium diet, exercising more, and limiting alcohol. This information is not intended to replace advice given to you by your health care provider. Make sure you discuss any questions you have with your health care provider. Document Revised: 06/02/2021 Document Reviewed: 06/02/2021 Elsevier Patient Education  2024 Elsevier Inc.  

## 2023-03-23 DIAGNOSIS — R7989 Other specified abnormal findings of blood chemistry: Secondary | ICD-10-CM | POA: Insufficient documentation

## 2023-03-23 MED ORDER — CARVEDILOL 25 MG PO TABS
25.0000 mg | ORAL_TABLET | Freq: Two times a day (BID) | ORAL | 0 refills | Status: DC
Start: 2023-03-23 — End: 2023-09-15

## 2023-03-23 MED ORDER — METHADONE HCL 10 MG PO TABS: 10.0000 mg | ORAL_TABLET | Freq: Three times a day (TID) | ORAL | 0 refills | Status: DC

## 2023-03-23 MED ORDER — CLONIDINE 0.2 MG/24HR TD PTWK
MEDICATED_PATCH | TRANSDERMAL | 0 refills | Status: DC
Start: 2023-03-23 — End: 2023-08-02

## 2023-03-23 MED ORDER — METHADONE HCL 5 MG PO TABS
5.0000 mg | ORAL_TABLET | Freq: Three times a day (TID) | ORAL | 0 refills | Status: DC
Start: 2023-03-23 — End: 2023-04-20

## 2023-03-23 MED ORDER — OLMESARTAN MEDOXOMIL 40 MG PO TABS
40.0000 mg | ORAL_TABLET | Freq: Every day | ORAL | 0 refills | Status: DC
Start: 2023-03-23 — End: 2023-06-27

## 2023-03-23 MED ORDER — NALOXONE HCL 4 MG/0.1ML NA LIQD
1.0000 | Freq: Once | NASAL | 2 refills | Status: AC
Start: 2023-03-23 — End: 2023-03-23

## 2023-03-24 ENCOUNTER — Telehealth: Payer: Self-pay | Admitting: Pharmacy Technician

## 2023-03-24 ENCOUNTER — Other Ambulatory Visit (HOSPITAL_COMMUNITY): Payer: Self-pay

## 2023-03-24 NOTE — Telephone Encounter (Signed)
Pharmacy Patient Advocate Encounter   Received notification from CoverMyMeds that prior authorization for Methadone HCl 10MG  tablets is required/requested.   Insurance verification completed.   The patient is insured through [facility] [place] .   Per test claim: PA required; PA submitted to [facility] Medicaid via CoverMyMeds Key/confirmation #/EOC [id number] Status is pending

## 2023-03-24 NOTE — Telephone Encounter (Signed)
Pharmacy Patient Advocate Encounter   Received notification from CoverMyMeds that prior authorization for Methadone HCl 5MG  tablets is required/requested.   Insurance verification completed.   The patient is insured through [facility] [place] .   Per test claim: PA required; PA submitted to [facility] Medicaid via CoverMyMeds Key/confirmation #/EOC [id number] Status is pending

## 2023-03-24 NOTE — Telephone Encounter (Signed)
Pharmacy Patient Advocate Encounter  Received notification from [facility] Medicaid that Prior Authorization for Methadone HCl 5MG  tablets has been APPROVED from 03/24/23 to 09/20/23. Ran test claim, Copay is $4.00. This test claim was processed through [facility]- copay amounts may vary at other pharmacies due to pharmacy/plan contracts, or as the patient moves through the different stages of their insurance plan.   PA #/Case ID/Reference #: [id number]

## 2023-03-24 NOTE — Telephone Encounter (Signed)
Pharmacy Patient Advocate Encounter  Received notification from [facility] Medicaid that Prior Authorization for Methadone hcl 10 Mg tab has been APPROVED from 03/24/2023 to 06/22/23. Ran test claim, Copay is $4.00. This test claim was processed through [facility]- copay amounts may vary at other pharmacies due to pharmacy/plan contracts, or as the patient moves through the different stages of their insurance plan.   PA #/Case ID/Reference #: [id number]

## 2023-03-28 ENCOUNTER — Encounter: Payer: Self-pay | Admitting: Internal Medicine

## 2023-04-01 ENCOUNTER — Telehealth (HOSPITAL_BASED_OUTPATIENT_CLINIC_OR_DEPARTMENT_OTHER): Payer: Self-pay | Admitting: *Deleted

## 2023-04-01 DIAGNOSIS — N183 Chronic kidney disease, stage 3 unspecified: Secondary | ICD-10-CM

## 2023-04-01 DIAGNOSIS — I10 Essential (primary) hypertension: Secondary | ICD-10-CM

## 2023-04-01 NOTE — Telephone Encounter (Signed)
Left message for patient to call and discuss scheduling the Echocardiogram ordered by Dr. Scarlette Calico

## 2023-04-01 NOTE — Telephone Encounter (Signed)
Called patient as getting her Echo at [facility]. She made aware we need lab  work and she can get it completed at that location on 3rd floor.  No additional questions at this time.

## 2023-04-18 ENCOUNTER — Other Ambulatory Visit: Payer: Self-pay | Admitting: Internal Medicine

## 2023-04-18 DIAGNOSIS — G894 Chronic pain syndrome: Secondary | ICD-10-CM

## 2023-04-18 DIAGNOSIS — M4802 Spinal stenosis, cervical region: Secondary | ICD-10-CM

## 2023-04-20 ENCOUNTER — Encounter: Payer: Self-pay | Admitting: Internal Medicine

## 2023-04-20 ENCOUNTER — Other Ambulatory Visit: Payer: Self-pay | Admitting: Internal Medicine

## 2023-04-20 DIAGNOSIS — F1120 Opioid dependence, uncomplicated: Secondary | ICD-10-CM | POA: Insufficient documentation

## 2023-04-20 DIAGNOSIS — G894 Chronic pain syndrome: Secondary | ICD-10-CM

## 2023-04-20 DIAGNOSIS — M4802 Spinal stenosis, cervical region: Secondary | ICD-10-CM

## 2023-04-20 MED ORDER — METHADONE HCL 10 MG PO TABS: 10.0000 mg | ORAL_TABLET | Freq: Three times a day (TID) | ORAL | 0 refills | Status: DC

## 2023-04-20 MED ORDER — METHADONE HCL 5 MG PO TABS: 5.0000 mg | ORAL_TABLET | Freq: Three times a day (TID) | ORAL | 0 refills | Status: DC

## 2023-04-28 ENCOUNTER — Other Ambulatory Visit (HOSPITAL_BASED_OUTPATIENT_CLINIC_OR_DEPARTMENT_OTHER): Payer: Medicaid Other

## 2023-05-05 ENCOUNTER — Ambulatory Visit (HOSPITAL_BASED_OUTPATIENT_CLINIC_OR_DEPARTMENT_OTHER): Payer: Medicaid Other

## 2023-05-05 DIAGNOSIS — R9431 Abnormal electrocardiogram [ECG] [EKG]: Secondary | ICD-10-CM

## 2023-05-05 DIAGNOSIS — R7989 Other specified abnormal findings of blood chemistry: Secondary | ICD-10-CM

## 2023-05-05 LAB — CBC WITH DIFFERENTIAL/PLATELET
Basophils Absolute: 0.1 10*3/uL (ref 0.0–0.2)
Basos: 1 %
EOS (ABSOLUTE): 0.2 10*3/uL (ref 0.0–0.4)
Eos: 2 %
Hematocrit: 43 % (ref 34.0–46.6)
Hemoglobin: 13.7 g/dL (ref 11.1–15.9)
Immature Grans (Abs): 0.1 10*3/uL (ref 0.0–0.1)
Immature Granulocytes: 1 %
Lymphocytes Absolute: 2.5 10*3/uL (ref 0.7–3.1)
Lymphs: 25 %
MCH: 29.4 pg (ref 26.6–33.0)
MCHC: 31.9 g/dL (ref 31.5–35.7)
MCV: 92 fL (ref 79–97)
Monocytes Absolute: 0.8 10*3/uL (ref 0.1–0.9)
Monocytes: 7 %
Neutrophils Absolute: 6.7 10*3/uL (ref 1.4–7.0)
Neutrophils: 64 %
Platelets: 232 10*3/uL (ref 150–450)
RBC: 4.66 x10E6/uL (ref 3.77–5.28)
RDW: 14.3 % (ref 11.7–15.4)
WBC: 10.4 10*3/uL (ref 3.4–10.8)

## 2023-05-05 LAB — ECHOCARDIOGRAM COMPLETE
Area-P 1/2: 3.27 cm2
S' Lateral: 1.72 cm

## 2023-05-17 ENCOUNTER — Other Ambulatory Visit: Payer: Self-pay | Admitting: Internal Medicine

## 2023-05-17 DIAGNOSIS — F1120 Opioid dependence, uncomplicated: Secondary | ICD-10-CM

## 2023-05-17 MED ORDER — METHADONE HCL 10 MG PO TABS: 10.0000 mg | ORAL_TABLET | Freq: Three times a day (TID) | ORAL | 0 refills | Status: DC

## 2023-05-17 MED ORDER — METHADONE HCL 5 MG PO TABS: 5.0000 mg | ORAL_TABLET | Freq: Three times a day (TID) | ORAL | 0 refills | Status: DC

## 2023-06-13 ENCOUNTER — Other Ambulatory Visit: Payer: Self-pay | Admitting: Internal Medicine

## 2023-06-13 DIAGNOSIS — F1120 Opioid dependence, uncomplicated: Secondary | ICD-10-CM

## 2023-06-14 MED ORDER — METHADONE HCL 10 MG PO TABS: 10.0000 mg | ORAL_TABLET | Freq: Three times a day (TID) | ORAL | 0 refills | Status: DC

## 2023-06-14 MED ORDER — METHADONE HCL 5 MG PO TABS: 5.0000 mg | ORAL_TABLET | Freq: Three times a day (TID) | ORAL | 0 refills | Status: DC

## 2023-06-27 ENCOUNTER — Other Ambulatory Visit: Payer: Self-pay | Admitting: Internal Medicine

## 2023-06-27 DIAGNOSIS — I10 Essential (primary) hypertension: Secondary | ICD-10-CM

## 2023-06-30 ENCOUNTER — Ambulatory Visit: Payer: Medicaid Other | Admitting: Internal Medicine

## 2023-07-11 ENCOUNTER — Other Ambulatory Visit: Payer: Self-pay | Admitting: Internal Medicine

## 2023-07-11 DIAGNOSIS — F1120 Opioid dependence, uncomplicated: Secondary | ICD-10-CM

## 2023-07-12 MED ORDER — METHADONE HCL 5 MG PO TABS: 5.0000 mg | ORAL_TABLET | Freq: Three times a day (TID) | ORAL | 0 refills | Status: DC

## 2023-07-12 MED ORDER — METHADONE HCL 10 MG PO TABS: 10.0000 mg | ORAL_TABLET | Freq: Three times a day (TID) | ORAL | 0 refills | Status: DC

## 2023-07-25 ENCOUNTER — Ambulatory Visit: Payer: Medicaid Other | Admitting: Internal Medicine

## 2023-07-28 ENCOUNTER — Ambulatory Visit: Payer: Medicaid Other

## 2023-07-31 ENCOUNTER — Other Ambulatory Visit: Payer: Self-pay | Admitting: Internal Medicine

## 2023-07-31 DIAGNOSIS — I10 Essential (primary) hypertension: Secondary | ICD-10-CM

## 2023-08-08 ENCOUNTER — Other Ambulatory Visit: Payer: Self-pay | Admitting: Internal Medicine

## 2023-08-08 DIAGNOSIS — F1120 Opioid dependence, uncomplicated: Secondary | ICD-10-CM

## 2023-08-11 ENCOUNTER — Encounter: Payer: Self-pay | Admitting: Internal Medicine

## 2023-08-11 MED ORDER — METHADONE HCL 10 MG PO TABS: 10.0000 mg | ORAL_TABLET | Freq: Three times a day (TID) | ORAL | 0 refills | Status: DC

## 2023-08-11 MED ORDER — METHADONE HCL 5 MG PO TABS: 5.0000 mg | ORAL_TABLET | Freq: Three times a day (TID) | ORAL | 0 refills | Status: DC

## 2023-08-22 ENCOUNTER — Ambulatory Visit: Payer: Medicaid Other | Admitting: Internal Medicine

## 2023-08-31 ENCOUNTER — Ambulatory Visit: Payer: Medicaid Other

## 2023-09-07 LAB — CBC AND DIFFERENTIAL
HCT: 35 — AB (ref 36–46)
Hemoglobin: 11 — AB (ref 12.0–16.0)
Platelets: 259 10*3/uL (ref 150–400)
WBC: 11.9

## 2023-09-09 ENCOUNTER — Other Ambulatory Visit: Payer: Self-pay | Admitting: Internal Medicine

## 2023-09-09 DIAGNOSIS — Z1231 Encounter for screening mammogram for malignant neoplasm of breast: Secondary | ICD-10-CM

## 2023-09-10 LAB — BASIC METABOLIC PANEL
BUN: 11 (ref 4–21)
CO2: 30 — AB (ref 13–22)
Chloride: 103 (ref 99–108)
Creatinine: 1 (ref 0.5–1.1)
Glucose: 111
Potassium: 3.7 meq/L (ref 3.5–5.1)
Sodium: 139 (ref 137–147)

## 2023-09-10 LAB — COMPREHENSIVE METABOLIC PANEL: Calcium: 9.1 (ref 8.7–10.7)

## 2023-09-12 ENCOUNTER — Telehealth: Payer: Self-pay

## 2023-09-12 NOTE — Transitions of Care (Post Inpatient/ED Visit) (Signed)
09/12/2023  Name: Emily [name] MRN: [id number] DOB: [birth year]  Today's TOC FU Call Status: Today's TOC FU Call Status:: Successful TOC FU Call Completed TOC FU Call Complete Date: 09/12/23 Patient's Name and Date of Birth confirmed.  Transition Care Management Follow-up Telephone Call Date of Discharge: 09/10/23 Discharge Facility: Other [facility]) Name of Other (Non-[facility]) Discharge Facility: [facility] Type of Discharge: Inpatient Admission Primary Inpatient Discharge Diagnosis:: nausea vomiting How have you been since you were released from the hospital?: Better Any questions or concerns?: No  Items Reviewed: Did you receive and understand the discharge instructions provided?: Yes Medications obtained,verified, and reconciled?: Yes (Medications Reviewed) Any new allergies since your discharge?: No Dietary orders reviewed?: Yes Do you have support at home?: Yes People in Home: spouse  Medications Reviewed Today: Medications Reviewed Today     Reviewed by Karena Addison, LPN (Licensed Practical Nurse) on 09/12/23 at 1057  Med List Status: <None>   Medication Order Taking? Sig Documenting Provider Last Dose Status Informant  atorvastatin (LIPITOR) 10 MG tablet [id number]  TAKE 1 TABLET(10 MG) BY MOUTH DAILY Etta Grandchild, MD  Active   carvedilol (COREG) 25 MG tablet [id number]  Take 1 tablet (25 mg total) by mouth 2 (two) times daily with a meal. Etta Grandchild, MD  Active   Cholecalciferol 50 MCG (2000 UT) TABS [id number]  Take 1 tablet (2,000 Units total) by mouth daily. Etta Grandchild, MD  Active   cloNIDine (CATAPRES - DOSED IN MG/24 HR) 0.2 mg/24hr patch [id number]  APPLY 1 PATCH TOPICALLY TO THE SKIN 1 TIME A WEEK Etta Grandchild, MD  Active   cyanocobalamin 2000 MCG tablet [id number]  Take 1 tablet (2,000 mcg total) by mouth daily. Etta Grandchild, MD  Active Self  Fluticasone-Umeclidin-Vilant (TRELEGY ELLIPTA) 100-62.5-25 MCG/ACT AEPB [id number]  Inhale 1  puff into the lungs daily. Etta Grandchild, MD  Active   folic acid (FOLVITE) 1 MG tablet [id number]  TAKE 1 TABLET(1 MG) BY MOUTH DAILY Etta Grandchild, MD  Active   methadone (DOLOPHINE) 10 MG tablet [id number]  Take 1 tablet (10 mg total) by mouth every 8 (eight) hours. Etta Grandchild, MD  Active   methadone (DOLOPHINE) 5 MG tablet [id number]  Take 1 tablet (5 mg total) by mouth every 8 (eight) hours. Etta Grandchild, MD  Active   olmesartan [facility]) 40 MG tablet [id number]  TAKE 1 TABLET(40 MG) BY MOUTH DAILY Etta Grandchild, MD  Active   Omega-3 Fatty Acids (FISH OIL) 1000 MG CAPS [id number]  Take 2 capsules by mouth every evening. [provider]  Active Self  ondansetron (ZOFRAN-ODT) 8 MG disintegrating tablet [id number] Yes Take 8 mg by mouth every 8 (eight) hours as needed. [provider]  Active   pantoprazole (PROTONIX) 40 MG tablet [id number] Yes Take 40 mg by mouth daily. [provider]  Active   scopolamine (TRANSDERM-SCOP) 1 MG/3DAYS [id number] Yes Place 1 patch onto the skin every 3 (three) days. [provider]  Active   sucralfate (CARAFATE) 1 g tablet [id number] Yes Take 1 g by mouth 4 (four) times daily. [provider]  Active             Home Care and Equipment/Supplies: Were Home Health Services Ordered?: NA Any new equipment or medical supplies ordered?: NA  Functional Questionnaire: Do you need assistance with bathing/showering or dressing?: No Do you need assistance with meal preparation?: No Do you need assistance with eating?: No  Do you have difficulty maintaining continence: No Do you need assistance with getting out of bed/getting out of a chair/moving?: No Do you have difficulty managing or taking your medications?: No  Follow up appointments reviewed: PCP Follow-up appointment confirmed?: Yes Date of PCP follow-up appointment?: 09/15/23 Follow-up Provider: [facility] Follow-up appointment  confirmed?: NA Do you need transportation to your follow-up appointment?: No Do you understand care options if your condition(s) worsen?: Yes-patient verbalized understanding    SIGNATURE Karena Addison, LPN [facility] Nurse Health Advisor Direct Dial [phone]

## 2023-09-13 ENCOUNTER — Ambulatory Visit: Payer: Medicaid Other

## 2023-09-15 ENCOUNTER — Encounter: Payer: Self-pay | Admitting: Internal Medicine

## 2023-09-15 ENCOUNTER — Ambulatory Visit (INDEPENDENT_AMBULATORY_CARE_PROVIDER_SITE_OTHER): Payer: Medicaid Other | Admitting: Internal Medicine

## 2023-09-15 ENCOUNTER — Ambulatory Visit: Payer: Medicaid Other

## 2023-09-15 VITALS — BP 162/118 | HR 82 | Temp 98.8°F | Resp 16 | Ht 60.0 in | Wt 133.4 lb

## 2023-09-15 DIAGNOSIS — L089 Local infection of the skin and subcutaneous tissue, unspecified: Secondary | ICD-10-CM

## 2023-09-15 DIAGNOSIS — K859 Acute pancreatitis without necrosis or infection, unspecified: Secondary | ICD-10-CM | POA: Diagnosis not present

## 2023-09-15 DIAGNOSIS — F1120 Opioid dependence, uncomplicated: Secondary | ICD-10-CM

## 2023-09-15 DIAGNOSIS — S51852A Open bite of left forearm, initial encounter: Secondary | ICD-10-CM | POA: Diagnosis not present

## 2023-09-15 DIAGNOSIS — I10 Essential (primary) hypertension: Secondary | ICD-10-CM | POA: Insufficient documentation

## 2023-09-15 DIAGNOSIS — D539 Nutritional anemia, unspecified: Secondary | ICD-10-CM | POA: Diagnosis not present

## 2023-09-15 DIAGNOSIS — W540XXA Bitten by dog, initial encounter: Secondary | ICD-10-CM | POA: Diagnosis not present

## 2023-09-15 DIAGNOSIS — D508 Other iron deficiency anemias: Secondary | ICD-10-CM

## 2023-09-15 DIAGNOSIS — D520 Dietary folate deficiency anemia: Secondary | ICD-10-CM

## 2023-09-15 DIAGNOSIS — N1831 Chronic kidney disease, stage 3a: Secondary | ICD-10-CM

## 2023-09-15 DIAGNOSIS — D511 Vitamin B12 deficiency anemia due to selective vitamin B12 malabsorption with proteinuria: Secondary | ICD-10-CM | POA: Insufficient documentation

## 2023-09-15 LAB — HEPATIC FUNCTION PANEL
ALT: 6 U/L (ref 0–35)
AST: 18 U/L (ref 0–37)
Albumin: 3.3 g/dL — ABNORMAL LOW (ref 3.5–5.2)
Alkaline Phosphatase: 116 U/L (ref 39–117)
Bilirubin, Direct: 0.1 mg/dL (ref 0.0–0.3)
Total Bilirubin: 0.4 mg/dL (ref 0.2–1.2)
Total Protein: 6.9 g/dL (ref 6.0–8.3)

## 2023-09-15 LAB — CBC WITH DIFFERENTIAL/PLATELET
Basophils Absolute: 0 10*3/uL (ref 0.0–0.1)
Basophils Relative: 0.6 % (ref 0.0–3.0)
Eosinophils Absolute: 0.1 10*3/uL (ref 0.0–0.7)
Eosinophils Relative: 1.7 % (ref 0.0–5.0)
HCT: 34.8 % — ABNORMAL LOW (ref 36.0–46.0)
Hemoglobin: 11.2 g/dL — ABNORMAL LOW (ref 12.0–15.0)
Lymphocytes Relative: 26.4 % (ref 12.0–46.0)
Lymphs Abs: 2.2 10*3/uL (ref 0.7–4.0)
MCHC: 32.3 g/dL (ref 30.0–36.0)
MCV: 85.2 fL (ref 78.0–100.0)
Monocytes Absolute: 0.8 10*3/uL (ref 0.1–1.0)
Monocytes Relative: 9.2 % (ref 3.0–12.0)
Neutro Abs: 5.3 10*3/uL (ref 1.4–7.7)
Neutrophils Relative %: 62.1 % (ref 43.0–77.0)
Platelets: 312 10*3/uL (ref 150.0–400.0)
RBC: 4.08 Mil/uL (ref 3.87–5.11)
RDW: 16.6 % — ABNORMAL HIGH (ref 11.5–15.5)
WBC: 8.5 10*3/uL (ref 4.0–10.5)

## 2023-09-15 LAB — LIPASE: Lipase: 123 U/L — ABNORMAL HIGH (ref 11.0–59.0)

## 2023-09-15 LAB — IBC + FERRITIN
Ferritin: 122 ng/mL (ref 10.0–291.0)
Iron: 31 ug/dL — ABNORMAL LOW (ref 42–145)
Saturation Ratios: 12.3 % — ABNORMAL LOW (ref 20.0–50.0)
TIBC: 252 ug/dL (ref 250.0–450.0)
Transferrin: 180 mg/dL — ABNORMAL LOW (ref 212.0–360.0)

## 2023-09-15 LAB — VITAMIN B12: Vitamin B-12: 206 pg/mL — ABNORMAL LOW (ref 211–911)

## 2023-09-15 LAB — FOLATE: Folate: 4.1 ng/mL — ABNORMAL LOW (ref >5.9)

## 2023-09-15 LAB — AMYLASE: Amylase: 31 U/L (ref 27–131)

## 2023-09-15 MED ORDER — FOLIC ACID 1 MG PO TABS: 1.0000 mg | ORAL_TABLET | Freq: Every day | ORAL | 0 refills | Status: DC

## 2023-09-15 MED ORDER — ONDANSETRON 8 MG PO TBDP: 8.0000 mg | ORAL_TABLET | Freq: Three times a day (TID) | ORAL | 0 refills | Status: AC | PRN

## 2023-09-15 MED ORDER — OLMESARTAN MEDOXOMIL 40 MG PO TABS
40.0000 mg | ORAL_TABLET | Freq: Every day | ORAL | 0 refills | Status: DC
Start: 2023-09-15 — End: 2023-12-20

## 2023-09-15 MED ORDER — SPIRONOLACTONE 25 MG PO TABS: 25.0000 mg | ORAL_TABLET | Freq: Every day | ORAL | 0 refills | Status: DC

## 2023-09-15 MED ORDER — AMOXICILLIN-POT CLAVULANATE 875-125 MG PO TABS: 1.0000 | ORAL_TABLET | Freq: Two times a day (BID) | ORAL | 0 refills | Status: AC

## 2023-09-15 MED ORDER — METHADONE HCL 5 MG PO TABS: 5.0000 mg | ORAL_TABLET | Freq: Three times a day (TID) | ORAL | 0 refills | Status: DC

## 2023-09-15 MED ORDER — CARVEDILOL 25 MG PO TABS: 25.0000 mg | ORAL_TABLET | Freq: Two times a day (BID) | ORAL | 0 refills | Status: DC

## 2023-09-15 MED ORDER — ACCRUFER 30 MG PO CAPS: 1.0000 | ORAL_CAPSULE | Freq: Two times a day (BID) | ORAL | 0 refills | Status: AC

## 2023-09-15 MED ORDER — METHADONE HCL 10 MG PO TABS: 10.0000 mg | ORAL_TABLET | Freq: Three times a day (TID) | ORAL | 0 refills | Status: DC

## 2023-09-15 NOTE — Patient Instructions (Signed)
Animal Bite, Adult Animal bites can be mild or serious. Small bites from house pets normally are mild. Bites from cats, strays, or wild animals can be serious. If a stray or wild animal bites you, you need to get medical help right away. You may also need a shot to prevent rabies infection. What increases the risk? Being near pets you do not know. Being near animals that are eating, sleeping, or caring for their babies. Being outside where small, wild animals move freely. What are the signs or symptoms? Pain. Bleeding. Swelling. Bruising. How is this treated? Treatment may include: Cleaning your wound. Rinsing out (flushing) your wound. This uses saline solution, which is made of salt and water. Putting a bandage on your wound. Closing your wound with stitches (sutures), staples, skin glue, or skin tape (adhesive strips). Antibiotic medicine. You may be given pills, cream, gel, or fluid through an IV. A tetanus shot. Rabies treatment, if the animal could have rabies. Surgery, if there is infection or damage that needs to be fixed. Follow these instructions at home: Medicines Take or apply over-the-counter and prescription medicines only as told by your doctor. If you were prescribed an antibiotic medicine, take or apply it as told by your doctor. Do not stop using it even if your wound gets better. Wound care  Follow instructions from your doctor about how to take care of your wound. Make sure you: Wash your hands with soap and water for at least 20 seconds before and after you change your bandage. If you cannot use soap and water, use hand sanitizer. Change your bandage. Leave stitches or skin glue in place for at least 2 weeks. Leave tape strips alone unless you are told to take them off. You may trim the edges of the tape strips if they curl up. Check your wound every day for signs of infection. Check for: More redness, swelling, or pain. More fluid or blood. Warmth. Pus or a  bad smell. General instructions  Raise (elevate) the injured area above the level of your heart while you are sitting or lying down. If told, put ice on the injured area. To do this: Put ice in a plastic bag. Place a towel between your skin and the bag. Leave the ice on for 20 minutes, 2-3 times per day. Take off the ice if your skin turns bright red. This is very important. If you cannot feel pain, heat, or cold, you have a greater risk of damage to the area. Keep all follow-up visits. Contact a doctor if: You have more redness, swelling, or pain around your wound. Your wound feels warm to the touch. You have a fever or chills. You have a general feeling of sickness (malaise). You feel like you may vomit. You vomit. You have pain that does not get better. Get help right away if: You have a red streak going away from your wound. You have any of these coming from your wound: Non-clear fluid. More blood. Pus or a bad smell. You have trouble moving your injured area. You lose feeling (have numbness) or feel tingling anywhere on your body. Summary Animal bites can be mild or serious. If a stray or wild animal bites you, you need to get medical help right away. Your doctor will look at the wound and may ask about how the animal bite happened. Treatment may include wound care, antibiotic medicine, a tetanus shot, and rabies treatment. This information is not intended to replace advice given to you   by your health care provider. Make sure you discuss any questions you have with your health care provider. Document Revised: 07/31/2021 Document Reviewed: 07/31/2021 Elsevier Patient Education  2024 Elsevier Inc.  

## 2023-09-15 NOTE — Progress Notes (Signed)
 Subjective:  Patient ID: Emily [name], female    DOB: Jun 22, 1966  Age: 58 y.o. MRN: [id number]  CC: Hypertension   HPI [name] presents for f/up ----  Discussed the use of AI scribe software for clinical note transcription with the patient, who gave verbal consent to proceed.  History of Present Illness   Emily [name] is a 58 year old female who presents with recent hospitalization for pancreatitis and influenza.  She began feeling ill on New Year's Eve, approximately one month ago, with symptoms of extreme fatigue and chest congestion. Difficulty breathing developed after three days. She was sick for about two weeks, then felt better for a few days before starting to vomit continuously for two days. Unable to keep food or water down, she went to the hospital where she was diagnosed with pancreatitis and influenza. She was hospitalized for eight or nine days. She was anemic.  Since discharge, she experiences persistent weakness and some nausea, though she is no longer vomiting. She has lost approximately fifteen pounds but is now able to keep down fluids and food, and her appetite has returned. No current cough, abdominal pain, fever, chills, headache, or blurred vision. She did not receive a flu shot this year.  Gallstones were identified during her hospital stay, and there was consideration that they might have contributed to the pancreatitis, though this was not confirmed. She did not undergo surgery for the gallstones.  She feels generally weak and experiences diffuse pain, but notes gradual improvement. She was informed of slight anemia upon hospital discharge.  She missed checking her blood pressure last night but notes it is usually elevated during visits.       Outpatient Medications Prior to Visit  Medication Sig Dispense Refill   atorvastatin  (LIPITOR) 10 MG tablet TAKE 1 TABLET(10 MG) BY MOUTH DAILY 90 tablet 1   Cholecalciferol  50 MCG (2000 UT) TABS  Take 1 tablet (2,000 Units total) by mouth daily. 90 tablet 1   cloNIDine  (CATAPRES  - DOSED IN MG/24 HR) 0.2 mg/24hr patch APPLY 1 PATCH TOPICALLY TO THE SKIN 1 TIME A WEEK 12 patch 0   cyanocobalamin  2000 MCG tablet Take 1 tablet (2,000 mcg total) by mouth daily. 90 tablet 1   Fluticasone-Umeclidin-Vilant (TRELEGY ELLIPTA ) 100-62.5-25 MCG/ACT AEPB Inhale 1 puff into the lungs daily. 120 each 1   Omega-3 Fatty Acids (FISH OIL) 1000 MG CAPS Take 2 capsules by mouth every evening.     pantoprazole (PROTONIX) 40 MG tablet Take 40 mg by mouth daily.     sucralfate (CARAFATE) 1 g tablet Take 1 g by mouth 4 (four) times daily.     carvedilol  (COREG ) 25 MG tablet Take 1 tablet (25 mg total) by mouth 2 (two) times daily with a meal. 180 tablet 0   folic acid  (FOLVITE ) 1 MG tablet TAKE 1 TABLET(1 MG) BY MOUTH DAILY 90 tablet 0   methadone  (DOLOPHINE ) 10 MG tablet Take 1 tablet (10 mg total) by mouth every 8 (eight) hours. 90 tablet 0   methadone  (DOLOPHINE ) 5 MG tablet Take 1 tablet (5 mg total) by mouth every 8 (eight) hours. 90 tablet 0   olmesartan  (BENICAR ) 40 MG tablet TAKE 1 TABLET(40 MG) BY MOUTH DAILY 90 tablet 0   ondansetron  (ZOFRAN -ODT) 8 MG disintegrating tablet Take 8 mg by mouth every 8 (eight) hours as needed.     No facility-administered medications prior to visit.    ROS Review of Systems  Objective:  BP (!) 162/118 (BP Location: Left Arm, Patient Position: Sitting)   Pulse 82   Temp 98.8 F (37.1 C) (Oral)   Resp 16   Ht 5' (1.524 m)   Wt 133 lb 6.4 oz (60.5 kg)   SpO2 97%   BMI 26.05 kg/m   BP Readings from Last 3 Encounters:  09/15/23 (!) 162/118  03/22/23 (!) 160/100  11/01/22 136/88    Wt Readings from Last 3 Encounters:  09/15/23 133 lb 6.4 oz (60.5 kg)  03/22/23 138 lb (62.6 kg)  11/01/22 138 lb (62.6 kg)    Physical Exam Vitals reviewed.  Constitutional:      General: She is not in acute distress.    Appearance: She is ill-appearing. She is not  toxic-appearing or diaphoretic.  HENT:     Mouth/Throat:     Mouth: Mucous membranes are moist.  Eyes:     General: No scleral icterus.    Conjunctiva/sclera: Conjunctivae normal.  Cardiovascular:     Rate and Rhythm: Normal rate and regular rhythm.     Heart sounds: No murmur heard. Pulmonary:     Effort: Pulmonary effort is normal.     Breath sounds: No stridor. No wheezing, rhonchi or rales.  Abdominal:     General: Abdomen is flat.     Palpations: There is no mass.     Tenderness: There is no abdominal tenderness. There is no guarding or rebound.     Hernia: No hernia is present.  Musculoskeletal:        General: Normal range of motion.     Cervical back: Neck supple.     Right lower leg: No edema.     Left lower leg: No edema.  Lymphadenopathy:     Cervical: No cervical adenopathy.  Skin:    General: Skin is warm.     Coloration: Skin is pale.     Findings: Erythema present.  Neurological:     General: No focal deficit present.  Psychiatric:        Mood and Affect: Mood normal.        Behavior: Behavior normal.     Lab Results  Component Value Date   WBC 8.5 09/15/2023   HGB 11.2 (L) 09/15/2023   HCT 34.8 (L) 09/15/2023   PLT 312.0 09/15/2023   GLUCOSE 109 (H) 03/22/2023   CHOL 177 11/01/2022   TRIG 185.0 (H) 11/01/2022   HDL 40.80 11/01/2022   LDLDIRECT 92.0 01/14/2022   LDLCALC 99 11/01/2022   ALT 6 09/15/2023   AST 18 09/15/2023   NA 139 09/10/2023   K 3.7 09/10/2023   CL 103 09/10/2023   CREATININE 1.0 09/10/2023   BUN 11 09/10/2023   CO2 30 (A) 09/10/2023   TSH 2.23 11/01/2022   INR 1.1 (H) 10/09/2020   HGBA1C 5.9 10/09/2020    MM 3D SCREEN BREAST BILATERAL Result Date: 01/18/2022 CLINICAL DATA:  Screening. EXAM: DIGITAL SCREENING BILATERAL MAMMOGRAM WITH TOMOSYNTHESIS AND CAD TECHNIQUE: Bilateral screening digital craniocaudal and mediolateral oblique mammograms were obtained. Bilateral screening digital breast tomosynthesis was performed. The  images were evaluated with computer-aided detection. COMPARISON:  None available. ACR Breast Density Category b: There are scattered areas of fibroglandular density. FINDINGS: There are no findings suspicious for malignancy. IMPRESSION: No mammographic evidence of malignancy. A result letter of this screening mammogram will be mailed directly to the patient. RECOMMENDATION: Screening mammogram in one year. (Code:SM-B-01Y) BI-RADS CATEGORY  1: Negative. Electronically Signed   By: Craig  Melia M.D.   On: 01/18/2022 11:17   IMPRESSION:   FOCAL FILLING DEFECT IN THE GALLBLADDER CONSISTENT WITH A STONE. THE EXTRAHEPATIC COMMON DUCT IS UPPER NORMAL IF NOT MILDLY DILATED AT 6 TO 7 MM WITHOUT PANCREATIC DUCTAL DILATATION.   MILD INCREASED T2 SIGNAL CENTERED ADJACENT TO THE PANCREAS CONSISTENT WITH ACUTE PANCREATITIS.   Electronically Signed by: LITTIE Rosalynn Jarrett Mickey, MD on 09/03/2023 3:41 PM   IMPRESSION:   FINDINGS MOST CONSISTENT WITH ACUTE PANCREATITIS.   MILD DILATION OF THE COMMON BILE DUCT COULD BE REACTIVE. FOLLOW-UP ULTRASOUND FOLLOWING APPROPRIATE THERAPY COULD BE CONSIDERED.   Electronically Signed by: Suzen Griffins, MD on 09/02/2023 5:34 PM   Assessment & Plan:   Acute pancreatitis without infection or necrosis, unspecified pancreatitis type- She is improving. -     Lipase; Future -     Amylase; Future -     Hepatic function panel; Future -     Ondansetron ; Take 1 tablet (8 mg total) by mouth every 8 (eight) hours as needed for up to 7 days.  Dispense: 30 tablet; Refill: 0  Uncomplicated opioid dependence (HCC) -     Methadone  HCl; Take 1 tablet (5 mg total) by mouth every 8 (eight) hours.  Dispense: 90 tablet; Refill: 0 -     Methadone  HCl; Take 1 tablet (10 mg total) by mouth every 8 (eight) hours.  Dispense: 90 tablet; Refill: 0  Malignant hypertension- Will add spironolactone . -     Carvedilol ; Take 1 tablet (25 mg total) by mouth 2 (two) times daily with a meal.  Dispense: 180  tablet; Refill: 0 -     Olmesartan  Medoxomil; Take 1 tablet (40 mg total) by mouth daily.  Dispense: 90 tablet; Refill: 0 -     Spironolactone ; Take 1 tablet (25 mg total) by mouth daily.  Dispense: 90 tablet; Refill: 0 -     AMB Referral [facility]I Care Management  Dog bite of left forearm with infection, initial encounter -     CBC with Differential/Platelet; Future -     DG Forearm Left; Future -     Amoxicillin -Pot Clavulanate; Take 1 tablet by mouth 2 (two) times daily for 7 days.  Dispense: 14 tablet; Refill: 0 -     Ondansetron ; Take 1 tablet (8 mg total) by mouth every 8 (eight) hours as needed for up to 7 days.  Dispense: 30 tablet; Refill: 0  Deficiency anemia -     Vitamin B12; Future -     Vitamin B1; Future -     Zinc ; Future -     Folate; Future -     IBC + Ferritin; Future -     CBC with Differential/Platelet; Future -     Reticulocytes; Future  Stage 3a chronic kidney disease (HCC)- Will avoid nephrotoxic agents   Iron deficiency anemia secondary to inadequate dietary iron intake -     ACCRUFeR ; Take 1 capsule (30 mg total) by mouth in the morning and at bedtime.  Dispense: 180 capsule; Refill: 0  Dietary folate deficiency anemia -     Folic Acid ; Take 1 tablet (1 mg total) by mouth daily.  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  Dispense: 90 tablet; Refill: 0     Follow-up: Return in about 3 months (around 12/13/2023).  Debby Molt, MD

## 2023-09-19 ENCOUNTER — Telehealth: Payer: Self-pay

## 2023-09-19 NOTE — Progress Notes (Signed)
 Care Guide Pharmacy Note  09/19/2023 Name: Emily [name] MRN: [id number] DOB: 01/25/1966  Referred By: Arcadio Knuckles, MD Reason for referral: Care Coordination (Outreach to schedule with Pharm d )   Emily [name] is a 58 y.o. year old female who is a primary care patient of Arcadio Knuckles, MD.  Emily [name] was referred to the pharmacist for assistance related to: HTN  Successful contact was made with the patient to discuss pharmacy services including being ready for the pharmacist to call at least 5 minutes before the scheduled appointment time and to have medication bottles and any blood pressure readings ready for review. The patient agreed to meet with the pharmacist via telephone visit on (date/time).10/06/2023  Lenton Rail , RMA     [place]  [facility] Guide  Direct Dial: [phone]  Website: [email or web address]

## 2023-09-20 ENCOUNTER — Ambulatory Visit: Payer: Medicaid Other | Admitting: Internal Medicine

## 2023-09-20 ENCOUNTER — Encounter: Payer: Self-pay | Admitting: Internal Medicine

## 2023-09-20 ENCOUNTER — Other Ambulatory Visit: Payer: Self-pay | Admitting: Internal Medicine

## 2023-09-20 DIAGNOSIS — D538 Other specified nutritional anemias: Secondary | ICD-10-CM | POA: Insufficient documentation

## 2023-09-20 DIAGNOSIS — E519 Thiamine deficiency, unspecified: Secondary | ICD-10-CM

## 2023-09-20 LAB — VITAMIN B1: Vitamin B1 (Thiamine): <6 nmol/L — ABNORMAL LOW (ref 8–30)

## 2023-09-20 LAB — RETICULOCYTES
ABS Retic: 61050 {cells}/uL (ref 20000–80000)
Retic Ct Pct: 1.5 %

## 2023-09-20 LAB — ZINC: Zinc: 76 ug/dL (ref 60–130)

## 2023-09-20 MED ORDER — THIAMINE HCL 100 MG PO TABS: 100.0000 mg | ORAL_TABLET | Freq: Every day | ORAL | 0 refills | Status: AC

## 2023-09-27 ENCOUNTER — Ambulatory Visit: Payer: Medicaid Other

## 2023-09-29 ENCOUNTER — Ambulatory Visit: Payer: Medicaid Other | Admitting: Internal Medicine

## 2023-10-05 ENCOUNTER — Ambulatory Visit: Payer: Medicaid Other | Admitting: Internal Medicine

## 2023-10-06 ENCOUNTER — Other Ambulatory Visit: Payer: Medicaid Other

## 2023-10-07 ENCOUNTER — Encounter: Payer: Self-pay | Admitting: Internal Medicine

## 2023-10-10 ENCOUNTER — Ambulatory Visit: Payer: Medicaid Other

## 2023-10-12 ENCOUNTER — Other Ambulatory Visit: Payer: Self-pay | Admitting: Internal Medicine

## 2023-10-12 DIAGNOSIS — F1120 Opioid dependence, uncomplicated: Secondary | ICD-10-CM

## 2023-10-12 MED ORDER — METHADONE HCL 10 MG PO TABS: 10.0000 mg | ORAL_TABLET | Freq: Three times a day (TID) | ORAL | 0 refills | Status: DC

## 2023-10-12 MED ORDER — METHADONE HCL 5 MG PO TABS: 5.0000 mg | ORAL_TABLET | Freq: Three times a day (TID) | ORAL | 0 refills | Status: DC

## 2023-10-17 ENCOUNTER — Other Ambulatory Visit: Payer: Medicaid Other | Admitting: Pharmacist

## 2023-10-17 DIAGNOSIS — D539 Nutritional anemia, unspecified: Secondary | ICD-10-CM

## 2023-10-17 DIAGNOSIS — I10 Essential (primary) hypertension: Secondary | ICD-10-CM

## 2023-10-17 NOTE — Patient Instructions (Signed)
 It was a pleasure speaking with you today!  Try spironolactone 1/2 tablet daily for 1 week then increase to 1 tablet daily for blood pressure. Make sure to stay hydrated.  Keep checking blood pressures and keep a log for [place] to review in 2 weeks.  Contact [facility] regarding the price of iron.  Feel free to call with any questions or concerns!  Arbutus Leas, PharmD, BCPS, CPP Clinical Pharmacist Practitioner [facility] [phone]

## 2023-10-17 NOTE — Progress Notes (Signed)
 10/17/2023 Name: Emily [name] MRN: [id number] DOB: 1966-02-18  Chief Complaint  Patient presents with   Hypertension   Medication Management    CLARIE CAMEY is a 58 y.o. year old female who presented for a telephone visit.   They were referred to the pharmacist by their PCP for assistance in managing hypertension.   Subjective:  Care Team: Primary Care Provider: Etta Grandchild, MD ; Next Scheduled Visit: none scheduled  Medication Access/Adherence  Current Pharmacy:  [facility] #[id number] [place], [place] - [address] AT [facility] OF [facility] [address] [place] [address] Phone: [phone] Fax: [phone]  [facility] [place], [place] - [id number] [place] [address] Phone: [phone] Fax: [phone]   Patient reports affordability concerns with their medications: No  Patient reports access/transportation concerns to their pharmacy: No  Patient reports adherence concerns with their medications:  Yes    Pt notes she did go through a period of nonadherence to medications however reports she is currently taking them  Hypertension:  Current medications: Carvedilol 12.5 mg BID, olmesartan 40 mg daily, clonidine patch 0.2 mg weekly *She notes she started spironolactone but then stopped it because she had a headache. Notes she has had trouble tolerating diuretics in the past. Medications previously tried: amlodipine, chlorthalidone, hydrochlorothiazide, indapamide, lisinopril, nebivolol, triamterene/HCTZ  Patient has a validated, automated, upper arm home BP cuff Current blood pressure readings: BP 130-135/90  Anemia: Current medications: Vitamin B12 OTC, Folic acid 1 mg daily, thiamine 100 mg daily She has not started iron supplement because she has not received from the pharmacy  Pt is no longer taking sucralfate   Objective: BP Readings from Last 3 Encounters:   09/15/23 (!) 162/118  03/22/23 (!) 160/100  11/01/22 136/88     Lab Results  Component Value Date   HGBA1C 5.9 10/09/2020    Lab Results  Component Value Date   CREATININE 1.0 09/10/2023   BUN 11 09/10/2023   NA 139 09/10/2023   K 3.7 09/10/2023   CL 103 09/10/2023   CO2 30 (A) 09/10/2023    Lab Results  Component Value Date   CHOL 177 11/01/2022   HDL 40.80 11/01/2022   LDLCALC 99 11/01/2022   LDLDIRECT 92.0 01/14/2022   TRIG 185.0 (H) 11/01/2022   CHOLHDL 4 11/01/2022    Medications Reviewed Today     Reviewed by Bonita Quin, RPH (Pharmacist) on 10/17/23 at 1331  Med List Status: <None>   Medication Order Taking? Sig Documenting Provider Last Dose Status Informant  atorvastatin (LIPITOR) 10 MG tablet [id number]  TAKE 1 TABLET(10 MG) BY MOUTH DAILY Etta Grandchild, MD  Active   carvedilol (COREG) 25 MG tablet [id number] Yes Take 1 tablet (25 mg total) by mouth 2 (two) times daily with a meal. Etta Grandchild, MD Taking Active   Cholecalciferol 50 MCG (2000 UT) TABS [id number]  Take 1 tablet (2,000 Units total) by mouth daily. Etta Grandchild, MD  Active   cloNIDine (CATAPRES - DOSED IN MG/24 HR) 0.2 mg/24hr patch [id number] Yes APPLY 1 PATCH TOPICALLY TO THE SKIN 1 TIME A WEEK Etta Grandchild, MD Taking Active   cyanocobalamin 2000 MCG tablet [id number] Yes Take 1 tablet (2,000 mcg total) by mouth daily. Etta Grandchild, MD Taking Active Self  Ferric Maltol (ACCRUFER) 30 MG CAPS [id number] No Take 1 capsule (30 mg total) by  mouth in the morning and at bedtime.  Patient not taking: Reported on 10/17/2023   Etta Grandchild, MD Not Taking Active   Fluticasone-Umeclidin-Vilant [facility] ELLIPTA) 100-62.5-25 MCG/ACT AEPB [id number]  Inhale 1 puff into the lungs daily. Etta Grandchild, MD  Active   folic acid (FOLVITE) 1 MG tablet [id number]  Take 1 tablet (1 mg total) by mouth daily. Etta Grandchild, MD  Active   methadone (DOLOPHINE) 10 MG tablet [id number] Yes Take 1 tablet  (10 mg total) by mouth every 8 (eight) hours. Etta Grandchild, MD Taking Active   methadone (DOLOPHINE) 5 MG tablet [id number]  Take 1 tablet (5 mg total) by mouth every 8 (eight) hours. Etta Grandchild, MD  Active   olmesartan [facility]) 40 MG tablet [id number] Yes Take 1 tablet (40 mg total) by mouth daily. Etta Grandchild, MD Taking Active   Omega-3 Fatty Acids (FISH OIL) 1000 MG CAPS [id number]  Take 2 capsules by mouth every evening. [provider]  Active Self  pantoprazole (PROTONIX) 40 MG tablet [id number]  Take 40 mg by mouth daily. [provider]  Active   spironolactone (ALDACTONE) 25 MG tablet [id number] No Take 1 tablet (25 mg total) by mouth daily.  Patient not taking: Reported on 10/17/2023   Etta Grandchild, MD Not Taking Active   thiamine (VITAMIN B1) 100 MG tablet [id number] Yes Take 1 tablet (100 mg total) by mouth daily. Etta Grandchild, MD Taking Active               Assessment/Plan:   Hypertension: - Currently uncontrolled, goal <130/80 - BP have improved per pt reported home readings - Recommended to check home blood pressure and heart rate daily - Recommend to retry spironolactone and ensure she is staying adequately hydrated. She can try taking 1/2 tablet daily x1 week then increase to 1 tablet daily - Will f/u in 2 weeks go BP readings  Anemia:  - she lives in [place], therefore is unable to come for B12 injections. Ok to try OTC vitamin B12 - Informed her Accrufer was sent to [employer]. She can contact them to see what the cost was for it. If unaffordable, taking ferrous sulfate 65 mg daily prior to food. Take with food if stomach upset occurs.    Follow Up Plan: 3/24  Arbutus Leas, PharmD, BCPS, CPP Clinical Pharmacist Practitioner [place] [facility] [phone]

## 2023-10-20 ENCOUNTER — Ambulatory Visit: Payer: Medicaid Other | Admitting: Obstetrics and Gynecology

## 2023-10-31 ENCOUNTER — Other Ambulatory Visit

## 2023-11-04 ENCOUNTER — Ambulatory Visit: Payer: Medicaid Other

## 2023-11-09 ENCOUNTER — Other Ambulatory Visit: Admitting: Pharmacist

## 2023-11-09 ENCOUNTER — Other Ambulatory Visit: Payer: Self-pay | Admitting: Internal Medicine

## 2023-11-09 DIAGNOSIS — I10 Essential (primary) hypertension: Secondary | ICD-10-CM

## 2023-11-09 DIAGNOSIS — D539 Nutritional anemia, unspecified: Secondary | ICD-10-CM

## 2023-11-09 DIAGNOSIS — F1120 Opioid dependence, uncomplicated: Secondary | ICD-10-CM

## 2023-11-09 NOTE — Progress Notes (Signed)
 11/11/2023 Name: Emily [name] MRN: [id number] DOB: 1966-06-28  Chief Complaint  Patient presents with   Hypertension   Medication Management    Emily [name] is a 58 y.o. year old female who presented for a telephone visit.   They were referred to the pharmacist by their PCP for assistance in managing hypertension.   Subjective:  Care Team: Primary Care Provider: Etta Grandchild, MD ; Next Scheduled Visit: none scheduled  Medication Access/Adherence  Current Pharmacy:  [facility] #[id number] [place], [place] - [address] AT [facility] OF [address] & [address] [place] [address] Phone: [phone] Fax: [phone]  [facility] [place] [place], [place] - [address] [phone] [address] [place] [address] Phone: [phone] Fax: [phone]   Patient reports affordability concerns with their medications: No  Patient reports access/transportation concerns to their pharmacy: No  Patient reports adherence concerns with their medications:  Yes    Pt notes she did go through a period of nonadherence to medications however reports she is currently taking them  Hypertension:  Current medications: Carvedilol 12.5 mg BID, olmesartan 40 mg daily, clonidine patch 0.2 mg weekly, spironolactone 25 mg daily *She titrated spironolactone up and has tolerated it better. Was on 25 mg 1/2 tablet for a few weeks and increased to 25 mg this week without issue Medications previously tried: amlodipine, chlorthalidone, hydrochlorothiazide, indapamide, lisinopril, nebivolol, triamterene/HCTZ  Patient has a validated, automated, upper arm home BP cuff Current blood pressure readings: BP 120-130/80s  She notes she has been doing well with her water intake  Denies dizziness.  Anemia: Current medications: Vitamin B12 OTC, Folic acid 1 mg daily, thiamine 100 mg daily, Accrufer 30 mg BID   Objective: BP Readings from Last 3  Encounters:  09/15/23 (!) 162/118  03/22/23 (!) 160/100  11/01/22 136/88     Lab Results  Component Value Date   HGBA1C 5.9 10/09/2020    Lab Results  Component Value Date   CREATININE 1.0 09/10/2023   BUN 11 09/10/2023   NA 139 09/10/2023   K 3.7 09/10/2023   CL 103 09/10/2023   CO2 30 (A) 09/10/2023    Lab Results  Component Value Date   CHOL 177 11/01/2022   HDL 40.80 11/01/2022   LDLCALC 99 11/01/2022   LDLDIRECT 92.0 01/14/2022   TRIG 185.0 (H) 11/01/2022   CHOLHDL 4 11/01/2022    Medications Reviewed Today     Reviewed by Bonita Quin, RPH (Pharmacist) on 11/11/23 at 2014  Med List Status: <None>   Medication Order Taking? Sig Documenting Provider Last Dose Status Informant  atorvastatin (LIPITOR) 10 MG tablet [id number]  TAKE 1 TABLET(10 MG) BY MOUTH DAILY Etta Grandchild, MD  Active   carvedilol (COREG) 25 MG tablet [id number]  Take 1 tablet (25 mg total) by mouth 2 (two) times daily with a meal. Etta Grandchild, MD  Active   Cholecalciferol 50 MCG (2000 UT) TABS [id number]  Take 1 tablet (2,000 Units total) by mouth daily. Etta Grandchild, MD  Active   cloNIDine (CATAPRES - DOSED IN MG/24 HR) 0.2 mg/24hr patch [id number] Yes APPLY 1 PATCH TOPICALLY TO THE SKIN 1 TIME A WEEK Etta Grandchild, MD Taking Active   cyanocobalamin 2000 MCG tablet [id number]  Take 1 tablet (2,000 mcg total) by mouth daily. Etta Grandchild, MD  Active Self  Ferric Maltol (ACCRUFER) 30 MG CAPS [id number] Yes Take  1 capsule (30 mg total) by mouth in the morning and at bedtime. Etta Grandchild, MD Taking Active   Fluticasone-Umeclidin-Vilant [facility] ELLIPTA) 100-62.5-25 MCG/ACT AEPB [id number]  Inhale 1 puff into the lungs daily. Etta Grandchild, MD  Active   folic acid (FOLVITE) 1 MG tablet [id number]  Take 1 tablet (1 mg total) by mouth daily. Etta Grandchild, MD  Active   methadone (DOLOPHINE) 10 MG tablet [id number]  Take 1 tablet (10 mg total) by mouth every 8 (eight) hours. Etta Grandchild, MD  Active   methadone (DOLOPHINE) 5 MG tablet [id number]  Take 1 tablet (5 mg total) by mouth every 8 (eight) hours. Etta Grandchild, MD  Active   olmesartan [facility]) 40 MG tablet [id number] Yes Take 1 tablet (40 mg total) by mouth daily. Etta Grandchild, MD Taking Active   Omega-3 Fatty Acids (FISH OIL) 1000 MG CAPS [id number]  Take 2 capsules by mouth every evening. [provider]  Active Self  pantoprazole (PROTONIX) 40 MG tablet [id number]  Take 40 mg by mouth daily. [provider]  Active   spironolactone (ALDACTONE) 25 MG tablet [id number] Yes Take 1 tablet (25 mg total) by mouth daily. Etta Grandchild, MD Taking Active   thiamine (VITAMIN B1) 100 MG tablet [id number]  Take 1 tablet (100 mg total) by mouth daily. Etta Grandchild, MD  Active               Assessment/Plan:   Hypertension: - Currently controlled, goal <130/80 - BP have improved per pt reported home readings - Recommended to check home blood pressure and heart rate daily - Recommend to continue olmesartan, clonidine patch, and spironolactone - Advised to scheduled 3 month f/u with PCP. Recommend updating BMP to check Scr and K after initiating spironolactone - Encouraged low sodium, high fiber diet  Anemia:  - she lives in [place], therefore is unable to come for B12 injections. Ok to try OTC vitamin B12 - Continue current regimen - Recommend CBC at PCP 3 month follow up    Follow Up Plan: PRN  Arbutus Leas, PharmD, BCPS, CPP Clinical Pharmacist Practitioner [place] [facility] at [facility] [phone]

## 2023-11-10 ENCOUNTER — Encounter: Payer: Self-pay | Admitting: Internal Medicine

## 2023-11-10 ENCOUNTER — Other Ambulatory Visit: Payer: Self-pay | Admitting: Internal Medicine

## 2023-11-10 DIAGNOSIS — F1120 Opioid dependence, uncomplicated: Secondary | ICD-10-CM

## 2023-11-10 DIAGNOSIS — I10 Essential (primary) hypertension: Secondary | ICD-10-CM

## 2023-11-10 MED ORDER — METHADONE HCL 10 MG PO TABS: 10.0000 mg | ORAL_TABLET | Freq: Three times a day (TID) | ORAL | 0 refills | Status: DC

## 2023-11-10 MED ORDER — METHADONE HCL 5 MG PO TABS: 5.0000 mg | ORAL_TABLET | Freq: Three times a day (TID) | ORAL | 0 refills | Status: DC

## 2023-11-11 NOTE — Patient Instructions (Signed)
 It was a pleasure speaking with you today!  Continue current regimen. Come in May for 3 month follow up with Dr. Yetta Barre.  Feel free to call with any questions or concerns!  Arbutus Leas, PharmD, BCPS, CPP Clinical Pharmacist Practitioner  Primary Care at [facility] [phone]

## 2023-11-21 ENCOUNTER — Ambulatory Visit: Admitting: Obstetrics & Gynecology

## 2023-11-30 ENCOUNTER — Ambulatory Visit

## 2023-12-07 ENCOUNTER — Encounter: Payer: Self-pay | Admitting: Internal Medicine

## 2023-12-07 ENCOUNTER — Other Ambulatory Visit: Payer: Self-pay | Admitting: Internal Medicine

## 2023-12-07 DIAGNOSIS — F1120 Opioid dependence, uncomplicated: Secondary | ICD-10-CM

## 2023-12-08 ENCOUNTER — Encounter: Payer: Self-pay | Admitting: Internal Medicine

## 2023-12-08 MED ORDER — METHADONE HCL 10 MG PO TABS: 10.0000 mg | ORAL_TABLET | Freq: Three times a day (TID) | ORAL | 0 refills | Status: DC

## 2023-12-08 MED ORDER — METHADONE HCL 5 MG PO TABS: 5.0000 mg | ORAL_TABLET | Freq: Three times a day (TID) | ORAL | 0 refills | Status: DC

## 2023-12-09 ENCOUNTER — Other Ambulatory Visit: Payer: Self-pay | Admitting: Internal Medicine

## 2023-12-09 DIAGNOSIS — D520 Dietary folate deficiency anemia: Secondary | ICD-10-CM

## 2023-12-09 DIAGNOSIS — D511 Vitamin B12 deficiency anemia due to selective vitamin B12 malabsorption with proteinuria: Secondary | ICD-10-CM

## 2023-12-09 DIAGNOSIS — I10 Essential (primary) hypertension: Secondary | ICD-10-CM

## 2023-12-10 ENCOUNTER — Other Ambulatory Visit: Payer: Self-pay | Admitting: Internal Medicine

## 2023-12-10 DIAGNOSIS — I10 Essential (primary) hypertension: Secondary | ICD-10-CM

## 2023-12-14 ENCOUNTER — Ambulatory Visit: Admitting: Obstetrics and Gynecology

## 2023-12-15 ENCOUNTER — Ambulatory Visit: Admitting: Internal Medicine

## 2023-12-27 ENCOUNTER — Encounter: Payer: Self-pay | Admitting: Internal Medicine

## 2023-12-28 ENCOUNTER — Ambulatory Visit

## 2023-12-29 ENCOUNTER — Other Ambulatory Visit: Payer: Self-pay

## 2023-12-29 DIAGNOSIS — I10 Essential (primary) hypertension: Secondary | ICD-10-CM

## 2023-12-29 DIAGNOSIS — D511 Vitamin B12 deficiency anemia due to selective vitamin B12 malabsorption with proteinuria: Secondary | ICD-10-CM

## 2023-12-29 DIAGNOSIS — D520 Dietary folate deficiency anemia: Secondary | ICD-10-CM

## 2023-12-29 MED ORDER — CARVEDILOL 25 MG PO TABS: 25.0000 mg | ORAL_TABLET | Freq: Two times a day (BID) | ORAL | 0 refills | Status: DC

## 2023-12-29 MED ORDER — SPIRONOLACTONE 25 MG PO TABS: 25.0000 mg | ORAL_TABLET | Freq: Every day | ORAL | 0 refills | Status: DC

## 2023-12-29 MED ORDER — FOLIC ACID 1 MG PO TABS: 1.0000 mg | ORAL_TABLET | Freq: Every day | ORAL | 0 refills | Status: DC

## 2023-12-29 NOTE — Addendum Note (Signed)
 Addended byVernard Goldberg on: 12/29/2023 04:47 PM   Modules accepted: Orders

## 2024-01-05 ENCOUNTER — Ambulatory Visit

## 2024-01-06 ENCOUNTER — Encounter: Payer: Self-pay | Admitting: Obstetrics and Gynecology

## 2024-01-09 ENCOUNTER — Ambulatory Visit (INDEPENDENT_AMBULATORY_CARE_PROVIDER_SITE_OTHER): Admitting: Internal Medicine

## 2024-01-09 ENCOUNTER — Ambulatory Visit: Admitting: Obstetrics and Gynecology

## 2024-01-09 ENCOUNTER — Encounter: Payer: Self-pay | Admitting: Internal Medicine

## 2024-01-09 VITALS — BP 160/96 | HR 66 | Temp 98.4°F | Resp 16 | Ht 60.0 in | Wt 123.0 lb

## 2024-01-09 DIAGNOSIS — E781 Pure hyperglyceridemia: Secondary | ICD-10-CM

## 2024-01-09 DIAGNOSIS — K7581 Nonalcoholic steatohepatitis (NASH): Secondary | ICD-10-CM

## 2024-01-09 DIAGNOSIS — E785 Hyperlipidemia, unspecified: Secondary | ICD-10-CM

## 2024-01-09 DIAGNOSIS — D538 Other specified nutritional anemias: Secondary | ICD-10-CM

## 2024-01-09 DIAGNOSIS — Z1211 Encounter for screening for malignant neoplasm of colon: Secondary | ICD-10-CM | POA: Insufficient documentation

## 2024-01-09 DIAGNOSIS — N1831 Chronic kidney disease, stage 3a: Secondary | ICD-10-CM | POA: Diagnosis not present

## 2024-01-09 DIAGNOSIS — D520 Dietary folate deficiency anemia: Secondary | ICD-10-CM

## 2024-01-09 DIAGNOSIS — D508 Other iron deficiency anemias: Secondary | ICD-10-CM | POA: Diagnosis not present

## 2024-01-09 DIAGNOSIS — I10 Essential (primary) hypertension: Secondary | ICD-10-CM

## 2024-01-09 DIAGNOSIS — F1120 Opioid dependence, uncomplicated: Secondary | ICD-10-CM

## 2024-01-09 DIAGNOSIS — Z720 Tobacco use: Secondary | ICD-10-CM

## 2024-01-09 DIAGNOSIS — Z79891 Long term (current) use of opiate analgesic: Secondary | ICD-10-CM

## 2024-01-09 DIAGNOSIS — E538 Deficiency of other specified B group vitamins: Secondary | ICD-10-CM

## 2024-01-09 DIAGNOSIS — D511 Vitamin B12 deficiency anemia due to selective vitamin B12 malabsorption with proteinuria: Secondary | ICD-10-CM | POA: Diagnosis not present

## 2024-01-09 DIAGNOSIS — E519 Thiamine deficiency, unspecified: Secondary | ICD-10-CM

## 2024-01-09 LAB — URINALYSIS, ROUTINE W REFLEX MICROSCOPIC
Bilirubin Urine: NEGATIVE
Ketones, ur: NEGATIVE
Leukocytes,Ua: NEGATIVE
Nitrite: NEGATIVE
Specific Gravity, Urine: <=1.005 — AB (ref 1.000–1.030)
Total Protein, Urine: NEGATIVE
Urine Glucose: NEGATIVE
Urobilinogen, UA: 0.2 (ref 0.0–1.0)
pH: 6 (ref 5.0–8.0)

## 2024-01-09 LAB — IBC + FERRITIN
Ferritin: 21.1 ng/mL (ref 10.0–291.0)
Iron: 42 ug/dL (ref 42–145)
Saturation Ratios: 11.5 % — ABNORMAL LOW (ref 20.0–50.0)
TIBC: 366.8 ug/dL (ref 250.0–450.0)
Transferrin: 262 mg/dL (ref 212.0–360.0)

## 2024-01-09 LAB — BASIC METABOLIC PANEL WITH GFR
BUN: 14 mg/dL (ref 6–23)
CO2: 27 meq/L (ref 19–32)
Calcium: 10 mg/dL (ref 8.4–10.5)
Chloride: 102 meq/L (ref 96–112)
Creatinine, Ser: 1.13 mg/dL (ref 0.40–1.20)
GFR: 53.4 mL/min — ABNORMAL LOW (ref >60.00)
Glucose, Bld: 125 mg/dL — ABNORMAL HIGH (ref 70–99)
Potassium: 4.3 meq/L (ref 3.5–5.1)
Sodium: 137 meq/L (ref 135–145)

## 2024-01-09 LAB — LIPID PANEL
Cholesterol: 156 mg/dL (ref 0–200)
HDL: 34.4 mg/dL — ABNORMAL LOW (ref >39.00)
LDL Cholesterol: 92 mg/dL (ref 0–99)
NonHDL: 121.45
Total CHOL/HDL Ratio: 5
Triglycerides: 145 mg/dL (ref 0.0–149.0)
VLDL: 29 mg/dL (ref 0.0–40.0)

## 2024-01-09 LAB — CBC WITH DIFFERENTIAL/PLATELET
Basophils Absolute: 0.1 10*3/uL (ref 0.0–0.1)
Basophils Relative: 0.8 % (ref 0.0–3.0)
Eosinophils Absolute: 0.1 10*3/uL (ref 0.0–0.7)
Eosinophils Relative: 1.4 % (ref 0.0–5.0)
HCT: 44.9 % (ref 36.0–46.0)
Hemoglobin: 14.8 g/dL (ref 12.0–15.0)
Lymphocytes Relative: 23.9 % (ref 12.0–46.0)
Lymphs Abs: 2.2 10*3/uL (ref 0.7–4.0)
MCHC: 33 g/dL (ref 30.0–36.0)
MCV: 81.6 fl (ref 78.0–100.0)
Monocytes Absolute: 0.5 10*3/uL (ref 0.1–1.0)
Monocytes Relative: 5.6 % (ref 3.0–12.0)
Neutro Abs: 6.4 10*3/uL (ref 1.4–7.7)
Neutrophils Relative %: 68.3 % (ref 43.0–77.0)
Platelets: 199 10*3/uL (ref 150.0–400.0)
RBC: 5.5 Mil/uL — ABNORMAL HIGH (ref 3.87–5.11)
RDW: 15.4 % (ref 11.5–15.5)
WBC: 9.3 10*3/uL (ref 4.0–10.5)

## 2024-01-09 LAB — TSH: TSH: 3.5 u[IU]/mL (ref 0.35–5.50)

## 2024-01-09 MED ORDER — ATORVASTATIN CALCIUM 10 MG PO TABS
10.0000 mg | ORAL_TABLET | Freq: Every day | ORAL | 1 refills | Status: DC
Start: 2024-01-09 — End: 2024-05-21

## 2024-01-09 MED ORDER — METHADONE HCL 10 MG PO TABS: 10.0000 mg | ORAL_TABLET | Freq: Three times a day (TID) | ORAL | 0 refills | Status: DC

## 2024-01-09 MED ORDER — METHADONE HCL 5 MG PO TABS: 5.0000 mg | ORAL_TABLET | Freq: Three times a day (TID) | ORAL | 0 refills | Status: DC

## 2024-01-09 MED ORDER — CYANOCOBALAMIN 1000 MCG/ML IJ SOLN
1000.0000 ug | Freq: Once | INTRAMUSCULAR | Status: AC
  Administered 2024-01-09: 1000 ug via INTRAMUSCULAR

## 2024-01-09 MED ORDER — CYANOCOBALAMIN 2000 MCG PO TABS: 2000.0000 ug | ORAL_TABLET | Freq: Every day | ORAL | 1 refills | Status: DC

## 2024-01-09 NOTE — Patient Instructions (Signed)

## 2024-01-09 NOTE — Progress Notes (Unsigned)
 Subjective:  Patient ID: Emily [name], female    DOB: Aug 10, 1965  Age: 58 y.o. MRN: [id number]  CC: Hypertension and Anemia   HPI Emily [name] presents for f/up ---  Discussed the use of AI scribe software for clinical note transcription with the patient, who gave verbal consent to proceed.  History of Present Illness   Emily [name] is a 58 year old female who presents with fatigue and numbness in the upper extremities.  She experiences significant fatigue and 'brain fog', along with numbness in her  U and LE's, which she attributes to her neck problems. She has a history of neck surgery and is reluctant to undergo another surgery unless necessary.  She takes methadone  for pain management, with her last dose being early in the morning of the visit. She also takes folic acid  and a B1 supplement twice daily but has not been taking B12 supplements. She does not take any additional medication for pain beyond methadone .  She reports recent weight loss, noting that her clothes are 'falling off'. She does not typically weigh herself, so she is unsure of the exact amount lost. No abdominal pain, nausea, vomiting, or blood in stool.  She experiences some weakness in her arms and legs and has difficulty sleeping at times, though she describes her sleep as 'not too bad'.       Outpatient Medications Prior to Visit  Medication Sig Dispense Refill   carvedilol  (COREG ) 25 MG tablet Take 1 tablet (25 mg total) by mouth 2 (two) times daily with a meal. 180 tablet 0   Cholecalciferol  50 MCG (2000 UT) TABS Take 1 tablet (2,000 Units total) by mouth daily. 90 tablet 1   cloNIDine  (CATAPRES  - DOSED IN MG/24 HR) 0.2 mg/24hr patch APPLY 1 PATCH TOPICALLY TO THE SKIN 1 TIME A WEEK 12 patch 0   Ferric Maltol  (ACCRUFER ) 30 MG CAPS Take 1 capsule (30 mg total) by mouth in the morning and at bedtime. 180 capsule 0   Fluticasone-Umeclidin-Vilant (TRELEGY ELLIPTA ) 100-62.5-25 MCG/ACT AEPB Inhale 1 puff  into the lungs daily. 120 each 1   folic acid  (FOLVITE ) 1 MG tablet Take 1 tablet (1 mg total) by mouth daily. 90 tablet 0   olmesartan  (BENICAR ) 40 MG tablet TAKE 1 TABLET(40 MG) BY MOUTH DAILY 90 tablet 0   Omega-3 Fatty Acids (FISH OIL) 1000 MG CAPS Take 2 capsules by mouth every evening.     pantoprazole (PROTONIX) 40 MG tablet Take 40 mg by mouth daily.     spironolactone  (ALDACTONE ) 25 MG tablet Take 1 tablet (25 mg total) by mouth daily. 90 tablet 0   thiamine  (VITAMIN B1) 100 MG tablet Take 1 tablet (100 mg total) by mouth daily. 90 tablet 0   atorvastatin  (LIPITOR) 10 MG tablet TAKE 1 TABLET(10 MG) BY MOUTH DAILY 90 tablet 1   cyanocobalamin  2000 MCG tablet Take 1 tablet (2,000 mcg total) by mouth daily. 90 tablet 1   methadone  (DOLOPHINE ) 10 MG tablet Take 1 tablet (10 mg total) by mouth every 8 (eight) hours. 90 tablet 0   methadone  (DOLOPHINE ) 5 MG tablet Take 1 tablet (5 mg total) by mouth every 8 (eight) hours. 90 tablet 0   No facility-administered medications prior to visit.    ROS Review of Systems  Constitutional:  Positive for fatigue and unexpected weight change (wt loss). Negative for appetite change, chills and diaphoresis.  HENT: Negative.    Eyes:  Negative for visual disturbance.  Respiratory:  Negative.  Negative for cough, chest tightness, shortness of breath and wheezing.   Cardiovascular:  Negative for chest pain, palpitations and leg swelling.  Gastrointestinal: Negative.  Negative for abdominal pain, blood in stool, constipation, diarrhea, nausea and vomiting.  Genitourinary:  Negative for difficulty urinating and dysuria.  Musculoskeletal:  Positive for neck pain. Negative for back pain.  Neurological:  Positive for numbness. Negative for dizziness, weakness, light-headedness and headaches.  Hematological:  Negative for adenopathy. Does not bruise/bleed easily.  Psychiatric/Behavioral: Negative.  Negative for decreased concentration and dysphoric mood. The  patient is not nervous/anxious.     Objective:  BP (!) 160/96 (BP Location: Left Arm, Patient Position: Sitting, Cuff Size: Small)   Pulse 66   Temp 98.4 F (36.9 C) (Oral)   Resp 16   Ht 5' (1.524 m)   Wt 123 lb (55.8 kg)   SpO2 95%   BMI 24.02 kg/m   BP Readings from Last 3 Encounters:  01/09/24 (!) 160/96  09/15/23 (!) 162/118  03/22/23 (!) 160/100    Wt Readings from Last 3 Encounters:  01/09/24 123 lb (55.8 kg)  09/15/23 133 lb 6.4 oz (60.5 kg)  03/22/23 138 lb (62.6 kg)    Physical Exam Vitals reviewed.  Constitutional:      Appearance: Normal appearance.  HENT:     Nose: Nose normal.     Mouth/Throat:     Mouth: Mucous membranes are moist.  Eyes:     General: No scleral icterus.    Conjunctiva/sclera: Conjunctivae normal.  Cardiovascular:     Rate and Rhythm: Regular rhythm. Bradycardia present.     Heart sounds: No murmur heard.    No friction rub. No gallop.     Comments: EKG- SB, 57 bpm No LVH, Q waves, or ST/T wave changes  Improved compared to the prior EKG 6 months ago Pulmonary:     Breath sounds: No stridor. No wheezing, rhonchi or rales.  Abdominal:     General: Abdomen is flat.     Palpations: There is no mass.     Tenderness: There is no abdominal tenderness. There is no guarding.     Hernia: No hernia is present.  Musculoskeletal:        General: Normal range of motion.     Cervical back: Neck supple.     Right lower leg: No edema.     Left lower leg: No edema.  Lymphadenopathy:     Cervical: No cervical adenopathy.  Skin:    General: Skin is warm.  Neurological:     General: No focal deficit present.     Mental Status: She is alert. Mental status is at baseline.  Psychiatric:        Mood and Affect: Mood normal.        Behavior: Behavior normal.     Lab Results  Component Value Date   WBC 9.3 01/09/2024   HGB 14.8 01/09/2024   HCT 44.9 01/09/2024   PLT 199.0 01/09/2024   GLUCOSE 125 (H) 01/09/2024   CHOL 156 01/09/2024    TRIG 145.0 01/09/2024   HDL 34.40 (L) 01/09/2024   LDLDIRECT 92.0 01/14/2022   LDLCALC 92 01/09/2024   ALT 6 09/15/2023   AST 18 09/15/2023   NA 137 01/09/2024   K 4.3 01/09/2024   CL 102 01/09/2024   CREATININE 1.13 01/09/2024   BUN 14 01/09/2024   CO2 27 01/09/2024   TSH 3.50 01/09/2024   INR 1.1 (H) 10/09/2020   HGBA1C  5.9 10/09/2020    MM 3D SCREEN BREAST BILATERAL Result Date: 01/18/2022 CLINICAL DATA:  Screening. EXAM: DIGITAL SCREENING BILATERAL MAMMOGRAM WITH TOMOSYNTHESIS AND CAD TECHNIQUE: Bilateral screening digital craniocaudal and mediolateral oblique mammograms were obtained. Bilateral screening digital breast tomosynthesis was performed. The images were evaluated with computer-aided detection. COMPARISON:  None available. ACR Breast Density Category b: There are scattered areas of fibroglandular density. FINDINGS: There are no findings suspicious for malignancy. IMPRESSION: No mammographic evidence of malignancy. A result letter of this screening mammogram will be mailed directly to the patient. RECOMMENDATION: Screening mammogram in one year. (Code:SM-B-01Y) BI-RADS CATEGORY  1: Negative. Electronically Signed   By: Anna Barnes M.D.   On: 01/18/2022 11:17    Assessment & Plan:  Hyperlipidemia with target LDL less than 70 -     Lipid panel; Future -     TSH; Future -     Atorvastatin  Calcium ; Take 1 tablet (10 mg total) by mouth daily.  Dispense: 90 tablet; Refill: 1  Stage 3a chronic kidney disease (HCC)- Will avoid nephrotoxic agents  -     Basic metabolic panel with GFR; Future -     Urinalysis, Routine w reflex microscopic; Future -     DRUG MONITOR, OPIATES,W/CONF, URINE; Future  Iron deficiency anemia secondary to inadequate dietary iron intake -     CBC with Differential/Platelet; Future -     IBC + Ferritin; Future  Dietary folate deficiency anemia -     CBC with Differential/Platelet; Future  Vit B12 defic anemia d/t slctv vit B12 malabsorp w  protein -     CBC with Differential/Platelet; Future -     Cyanocobalamin   Anemia due to acquired thiamine  deficiency -     CBC with Differential/Platelet; Future  Malignant hypertension- BP is not at goal. She is working on her lifestyle modifications. -     Basic metabolic panel with GFR; Future -     Urinalysis, Routine w reflex microscopic; Future -     TSH; Future -     EKG 12-Lead -     DRUG MONITOR, OPIATES,W/CONF, URINE; Future  Hypertriglyceridemia -     Lipid panel; Future  Uncomplicated opioid dependence (HCC) -     Methadone  HCl; Take 1 tablet (5 mg total) by mouth every 8 (eight) hours.  Dispense: 90 tablet; Refill: 0 -     Methadone  HCl; Take 1 tablet (10 mg total) by mouth every 8 (eight) hours.  Dispense: 90 tablet; Refill: 0 -     DRUG MONITORING, PANEL 7 WITH CONFIRMATION, URINE; Future -     DRUG MONITOR, OPIATES,W/CONF, URINE; Future  Encounter for long-term opiate analgesic use -     DRUG MONITORING, PANEL 7 WITH CONFIRMATION, URINE; Future -     DRUG MONITOR, OPIATES,W/CONF, URINE; Future  Screening for colon cancer -     Ambulatory referral to Gastroenterology  B12 deficiency -     Cyanocobalamin ; Take 1 tablet (2,000 mcg total) by mouth daily.  Dispense: 90 tablet; Refill: 1  NASH (nonalcoholic steatohepatitis)  Tobacco abuse -     Ambulatory Referral for Lung Cancer Scre  Other orders -     DM TEMPLATE     Follow-up: Return in about 3 months (around 04/10/2024).  Sandra Crouch, MD

## 2024-01-12 ENCOUNTER — Ambulatory Visit: Payer: Self-pay | Admitting: Internal Medicine

## 2024-01-12 LAB — DRUG MONITORING, PANEL 7 WITH CONFIRMATION, URINE
6 Acetylmorphine: NEGATIVE ng/mL (ref <10)
Alcohol Metabolites: NEGATIVE ng/mL (ref <500)
Amphetamines: NEGATIVE ng/mL (ref <500)
Barbiturates: NEGATIVE ng/mL (ref <300)
Benzodiazepines: NEGATIVE ng/mL (ref <100)
Cocaine Metabolite: NEGATIVE ng/mL (ref <150)
Creatinine: 24.3 mg/dL (ref >=20.0)
EDDP: 1935 ng/mL — ABNORMAL HIGH (ref <100)
Marijuana Metabolite: NEGATIVE ng/mL (ref <20)
Methadone Metabolite: POSITIVE ng/mL — AB (ref <100)
Methadone: 2894 ng/mL — ABNORMAL HIGH (ref <100)
Opiates: NEGATIVE ng/mL (ref <100)
Oxidant: NEGATIVE ug/mL (ref <200)
Oxycodone: NEGATIVE ng/mL (ref <100)
pH: 6.6 (ref 4.5–9.0)

## 2024-01-12 LAB — DM TEMPLATE

## 2024-01-17 ENCOUNTER — Ambulatory Visit

## 2024-02-06 ENCOUNTER — Encounter: Payer: Self-pay | Admitting: Internal Medicine

## 2024-02-06 ENCOUNTER — Other Ambulatory Visit: Payer: Self-pay

## 2024-02-06 DIAGNOSIS — F1120 Opioid dependence, uncomplicated: Secondary | ICD-10-CM

## 2024-02-06 MED ORDER — METHADONE HCL 10 MG PO TABS: 10.0000 mg | ORAL_TABLET | Freq: Three times a day (TID) | ORAL | 0 refills | Status: DC

## 2024-02-06 MED ORDER — METHADONE HCL 5 MG PO TABS: 5.0000 mg | ORAL_TABLET | Freq: Three times a day (TID) | ORAL | 0 refills | Status: DC

## 2024-02-07 ENCOUNTER — Other Ambulatory Visit: Payer: Self-pay | Admitting: Internal Medicine

## 2024-02-07 DIAGNOSIS — I10 Essential (primary) hypertension: Secondary | ICD-10-CM

## 2024-02-08 ENCOUNTER — Telehealth: Payer: Self-pay

## 2024-02-24 ENCOUNTER — Encounter: Payer: Self-pay | Admitting: Advanced Practice Midwife

## 2024-02-27 ENCOUNTER — Telehealth: Payer: Self-pay | Admitting: *Deleted

## 2024-02-27 ENCOUNTER — Other Ambulatory Visit: Payer: Self-pay | Admitting: *Deleted

## 2024-02-27 DIAGNOSIS — Z87891 Personal history of nicotine dependence: Secondary | ICD-10-CM

## 2024-02-27 DIAGNOSIS — F17210 Nicotine dependence, cigarettes, uncomplicated: Secondary | ICD-10-CM

## 2024-02-27 DIAGNOSIS — Z122 Encounter for screening for malignant neoplasm of respiratory organs: Secondary | ICD-10-CM

## 2024-02-27 NOTE — Telephone Encounter (Signed)
 Lung Cancer Screening Narrative/Criteria Questionnaire (Cigarette Smokers Only- No Cigars/Pipes/vapes)   Emily [name]   SDMV:03/02/24 10:30- [name]                                           [birth year]              LDCT: 03/05/24 12:40- GI    58 y.o.   Phone: [phone]  Lung Screening Narrative (confirm age 4-77 yrs Medicare / 50-80 yrs Private pay insurance)   Insurance information:wellcare   Referring Provider:Jones   This screening involves an initial phone call with a team member from our program. It is called a shared decision making visit. The initial meeting is required by insurance and Medicare to make sure you understand the program. This appointment takes about 15-20 minutes to complete. The CT scan will completed at a separate date/time. This scan takes about 5-10 minutes to complete and you may eat and drink before and after the scan.  Criteria questions for Lung Cancer Screening:   Are you a current or former smoker? Current Age began smoking: 14   If you are a former smoker, what year did you quit smoking? (within 15 yrs)   To calculate your smoking history, I need an accurate estimate of how many packs of cigarettes you smoked per day and for how many years. (Not just the number of PPD you are now smoking)   Years smoking 34 x Packs per day 1.5 = Pack years 51   (at least 20 pack yrs)   (Make sure they understand that we need to know how much they have smoked in the past, not just the number of PPD they are smoking now)  Do you have a personal history of cancer?  No    Do you have a family history of cancer? Yes  (cancer type and and relative) father (stomach) Brother (melanoma)   Are you coughing up blood?  No  Have you had unexplained weight loss of 15 lbs or more in the last 6 months? No  It looks like you meet all criteria.     Additional information: N/A

## 2024-03-01 ENCOUNTER — Encounter: Payer: Self-pay | Admitting: Adult Health

## 2024-03-01 NOTE — Progress Notes (Unsigned)
  Virtual Visit via Telephone Note  I connected with OFFIE PICKRON , 03/01/24 11:28 AM by a telemedicine application and verified that I am speaking with the correct person using two identifiers.  Location: Patient: home Provider: home   I discussed the limitations of evaluation and management by telemedicine and the availability of in person appointments. The patient expressed understanding and agreed to proceed.   Shared Decision Making Visit Lung Cancer Screening Program [phone])   Eligibility: 58 y.o. Pack Years Smoking History Calculation = 51 pack years  (# packs/per year x # years smoked) Recent History of coughing up blood  no Unexplained weight loss? no ( >Than 15 pounds within the last 6 months ) Prior History Lung / other cancer no (Diagnosis within the last 5 years already requiring surveillance chest CT Scans). Smoking Status Current Smoker   Visit Components: Discussion included one or more decision making aids. YES Discussion included risk/benefits of screening. YES Discussion included potential follow up diagnostic testing for abnormal scans. YES Discussion included meaning and risk of over diagnosis. YES Discussion included meaning and risk of False Positives. YES Discussion included meaning of total radiation exposure. YES  Counseling Included: Importance of adherence to annual lung cancer LDCT screening. YES Impact of comorbidities on ability to participate in the program. YES Ability and willingness to under diagnostic treatment. YES  Smoking Cessation Counseling: Current Smokers:  Discussed importance of smoking cessation. yes Information about tobacco cessation classes and interventions provided to patient. yes Patient provided with ticket for LDCT Scan. yes Symptomatic Patient. NO Diagnosis Code: Tobacco Use Z72.0 Asymptomatic Patient yes  Counseling - 4 minutes of smoking cessation counseling  (CT Chest Lung Cancer Screening Low Dose W/O  CM) [id number]  Z12.2-Screening of respiratory organs Z87.891-Personal history of nicotine dependence   Lamarr Myers 03/01/24

## 2024-03-01 NOTE — Patient Instructions (Signed)

## 2024-03-02 ENCOUNTER — Ambulatory Visit: Admitting: Adult Health

## 2024-03-02 ENCOUNTER — Telehealth: Payer: Self-pay | Admitting: Acute Care

## 2024-03-02 DIAGNOSIS — F17210 Nicotine dependence, cigarettes, uncomplicated: Secondary | ICD-10-CM | POA: Diagnosis not present

## 2024-03-02 NOTE — Telephone Encounter (Signed)
 Patient left VM to reschedule initial LDCT. She has sdmv today.  Returned call but no answer. Left VM to keep sdmv today and that LDCT has been cancelled.  Please return our call to reschedule the CT.

## 2024-03-05 ENCOUNTER — Encounter: Payer: Self-pay | Admitting: Internal Medicine

## 2024-03-05 ENCOUNTER — Other Ambulatory Visit: Payer: Self-pay | Admitting: Internal Medicine

## 2024-03-05 ENCOUNTER — Other Ambulatory Visit

## 2024-03-05 DIAGNOSIS — F1120 Opioid dependence, uncomplicated: Secondary | ICD-10-CM

## 2024-03-05 DIAGNOSIS — I10 Essential (primary) hypertension: Secondary | ICD-10-CM

## 2024-03-05 MED ORDER — METHADONE HCL 5 MG PO TABS: 5.0000 mg | ORAL_TABLET | Freq: Three times a day (TID) | ORAL | 0 refills | Status: DC

## 2024-03-05 MED ORDER — METHADONE HCL 10 MG PO TABS: 10.0000 mg | ORAL_TABLET | Freq: Three times a day (TID) | ORAL | 0 refills | Status: DC

## 2024-03-05 MED ORDER — OLMESARTAN MEDOXOMIL 40 MG PO TABS: 40.0000 mg | ORAL_TABLET | Freq: Every day | ORAL | 0 refills | Status: DC

## 2024-04-02 ENCOUNTER — Encounter: Payer: Self-pay | Admitting: Internal Medicine

## 2024-04-03 ENCOUNTER — Encounter: Payer: Self-pay | Admitting: Internal Medicine

## 2024-04-03 ENCOUNTER — Other Ambulatory Visit: Payer: Self-pay

## 2024-04-03 DIAGNOSIS — F1120 Opioid dependence, uncomplicated: Secondary | ICD-10-CM

## 2024-04-03 MED ORDER — METHADONE HCL 5 MG PO TABS: 5.0000 mg | ORAL_TABLET | Freq: Three times a day (TID) | ORAL | 0 refills | Status: DC

## 2024-04-03 MED ORDER — METHADONE HCL 10 MG PO TABS: 10.0000 mg | ORAL_TABLET | Freq: Three times a day (TID) | ORAL | 0 refills | Status: DC

## 2024-04-26 ENCOUNTER — Ambulatory Visit: Admitting: Internal Medicine

## 2024-04-30 ENCOUNTER — Encounter: Payer: Self-pay | Admitting: Internal Medicine

## 2024-04-30 ENCOUNTER — Other Ambulatory Visit: Payer: Self-pay | Admitting: Internal Medicine

## 2024-04-30 DIAGNOSIS — F1120 Opioid dependence, uncomplicated: Secondary | ICD-10-CM

## 2024-04-30 MED ORDER — METHADONE HCL 10 MG PO TABS: 10.0000 mg | ORAL_TABLET | Freq: Three times a day (TID) | ORAL | 0 refills | Status: DC

## 2024-04-30 MED ORDER — METHADONE HCL 5 MG PO TABS: 5.0000 mg | ORAL_TABLET | Freq: Three times a day (TID) | ORAL | 0 refills | Status: DC

## 2024-05-21 ENCOUNTER — Ambulatory Visit: Payer: Self-pay | Admitting: Internal Medicine

## 2024-05-21 ENCOUNTER — Ambulatory Visit (INDEPENDENT_AMBULATORY_CARE_PROVIDER_SITE_OTHER): Admitting: Internal Medicine

## 2024-05-21 VITALS — BP 138/82 | HR 69 | Temp 98.5°F | Resp 18 | Wt 124.4 lb

## 2024-05-21 DIAGNOSIS — D511 Vitamin B12 deficiency anemia due to selective vitamin B12 malabsorption with proteinuria: Secondary | ICD-10-CM

## 2024-05-21 DIAGNOSIS — D520 Dietary folate deficiency anemia: Secondary | ICD-10-CM | POA: Diagnosis not present

## 2024-05-21 DIAGNOSIS — I10 Essential (primary) hypertension: Secondary | ICD-10-CM | POA: Diagnosis not present

## 2024-05-21 DIAGNOSIS — Z23 Encounter for immunization: Secondary | ICD-10-CM | POA: Diagnosis not present

## 2024-05-21 DIAGNOSIS — D508 Other iron deficiency anemias: Secondary | ICD-10-CM

## 2024-05-21 DIAGNOSIS — R739 Hyperglycemia, unspecified: Secondary | ICD-10-CM

## 2024-05-21 DIAGNOSIS — N1832 Chronic kidney disease, stage 3b: Secondary | ICD-10-CM

## 2024-05-21 DIAGNOSIS — E785 Hyperlipidemia, unspecified: Secondary | ICD-10-CM

## 2024-05-21 DIAGNOSIS — H1013 Acute atopic conjunctivitis, bilateral: Secondary | ICD-10-CM

## 2024-05-21 LAB — URINALYSIS, ROUTINE W REFLEX MICROSCOPIC
Bilirubin Urine: NEGATIVE
Ketones, ur: NEGATIVE
Leukocytes,Ua: NEGATIVE
Nitrite: NEGATIVE
Specific Gravity, Urine: 1.01 (ref 1.000–1.030)
Total Protein, Urine: NEGATIVE
Urine Glucose: NEGATIVE
Urobilinogen, UA: 0.2 (ref 0.0–1.0)
pH: 7.5 (ref 5.0–8.0)

## 2024-05-21 LAB — CBC WITH DIFFERENTIAL/PLATELET
Basophils Absolute: 0.1 K/uL (ref 0.0–0.1)
Basophils Relative: 0.6 % (ref 0.0–3.0)
Eosinophils Absolute: 0.1 K/uL (ref 0.0–0.7)
Eosinophils Relative: 0.6 % (ref 0.0–5.0)
HCT: 43.7 % (ref 36.0–46.0)
Hemoglobin: 14.3 g/dL (ref 12.0–15.0)
Lymphocytes Relative: 11.8 % — ABNORMAL LOW (ref 12.0–46.0)
Lymphs Abs: 1.7 K/uL (ref 0.7–4.0)
MCHC: 32.7 g/dL (ref 30.0–36.0)
MCV: 83.6 fl (ref 78.0–100.0)
Monocytes Absolute: 0.9 K/uL (ref 0.1–1.0)
Monocytes Relative: 5.9 % (ref 3.0–12.0)
Neutro Abs: 11.9 K/uL — ABNORMAL HIGH (ref 1.4–7.7)
Neutrophils Relative %: 81.1 % — ABNORMAL HIGH (ref 43.0–77.0)
Platelets: 226 K/uL (ref 150.0–400.0)
RBC: 5.22 Mil/uL — ABNORMAL HIGH (ref 3.87–5.11)
RDW: 15 % (ref 11.5–15.5)
WBC: 14.7 K/uL — ABNORMAL HIGH (ref 4.0–10.5)

## 2024-05-21 LAB — TSH: TSH: 2.75 u[IU]/mL (ref 0.35–5.50)

## 2024-05-21 LAB — BASIC METABOLIC PANEL WITH GFR
BUN: 16 mg/dL (ref 6–23)
CO2: 29 meq/L (ref 19–32)
Calcium: 9.9 mg/dL (ref 8.4–10.5)
Chloride: 98 meq/L (ref 96–112)
Creatinine, Ser: 1.19 mg/dL (ref 0.40–1.20)
GFR: 50.05 mL/min — ABNORMAL LOW (ref >60.00)
Glucose, Bld: 108 mg/dL — ABNORMAL HIGH (ref 70–99)
Potassium: 4.1 meq/L (ref 3.5–5.1)
Sodium: 136 meq/L (ref 135–145)

## 2024-05-21 LAB — HEMOGLOBIN A1C: Hgb A1c MFr Bld: 6.2 % (ref 4.6–6.5)

## 2024-05-21 MED ORDER — COVID-19 MRNA VAC-TRIS(PFIZER) 30 MCG/0.3ML IM SUSY
0.3000 mL | PREFILLED_SYRINGE | Freq: Once | INTRAMUSCULAR | 0 refills | Status: AC
Start: 2024-05-21 — End: 2024-05-21

## 2024-05-21 MED ORDER — SHINGRIX 50 MCG/0.5ML IM SUSR: 0.5000 mL | Freq: Once | INTRAMUSCULAR | 1 refills | Status: AC

## 2024-05-21 MED ORDER — ATORVASTATIN CALCIUM 10 MG PO TABS: 10.0000 mg | ORAL_TABLET | Freq: Every day | ORAL | 0 refills | Status: AC

## 2024-05-21 MED ORDER — OLOPATADINE HCL 0.1 % OP SOLN: 1.0000 [drp] | Freq: Two times a day (BID) | OPHTHALMIC | 3 refills | Status: AC

## 2024-05-21 MED ORDER — CYANOCOBALAMIN 2000 MCG PO TABS: 2000.0000 ug | ORAL_TABLET | Freq: Every day | ORAL | 0 refills | Status: AC

## 2024-05-21 MED ORDER — FOLIC ACID 1 MG PO TABS: 1.0000 mg | ORAL_TABLET | Freq: Every day | ORAL | 0 refills | Status: AC

## 2024-05-21 MED ORDER — CYANOCOBALAMIN 1000 MCG/ML IJ SOLN
1000.0000 ug | Freq: Once | INTRAMUSCULAR | Status: AC
  Administered 2024-05-21: 1000 ug via INTRAMUSCULAR

## 2024-05-21 NOTE — Patient Instructions (Signed)
 Hypertension, Adult High blood pressure (hypertension) is when the force of blood pumping through the arteries is too strong. The arteries are the blood vessels that carry blood from the heart throughout the body. Hypertension forces the heart to work harder to pump blood and may cause arteries to become narrow or stiff. Untreated or uncontrolled hypertension can lead to a heart attack, heart failure, a stroke, kidney disease, and other problems. A blood pressure reading consists of a higher number over a lower number. Ideally, your blood pressure should be below 120/80. The first ("top") number is called the systolic pressure. It is a measure of the pressure in your arteries as your heart beats. The second ("bottom") number is called the diastolic pressure. It is a measure of the pressure in your arteries as the heart relaxes. What are the causes? The exact cause of this condition is not known. There are some conditions that result in high blood pressure. What increases the risk? Certain factors may make you more likely to develop high blood pressure. Some of these risk factors are under your control, including: Smoking. Not getting enough exercise or physical activity. Being overweight. Having too much fat, sugar, calories, or salt (sodium) in your diet. Drinking too much alcohol. Other risk factors include: Having a personal history of heart disease, diabetes, high cholesterol, or kidney disease. Stress. Having a family history of high blood pressure and high cholesterol. Having obstructive sleep apnea. Age. The risk increases with age. What are the signs or symptoms? High blood pressure may not cause symptoms. Very high blood pressure (hypertensive crisis) may cause: Headache. Fast or irregular heartbeats (palpitations). Shortness of breath. Nosebleed. Nausea and vomiting. Vision changes. Severe chest pain, dizziness, and seizures. How is this diagnosed? This condition is diagnosed by  measuring your blood pressure while you are seated, with your arm resting on a flat surface, your legs uncrossed, and your feet flat on the floor. The cuff of the blood pressure monitor will be placed directly against the skin of your upper arm at the level of your heart. Blood pressure should be measured at least twice using the same arm. Certain conditions can cause a difference in blood pressure between your right and left arms. If you have a high blood pressure reading during one visit or you have normal blood pressure with other risk factors, you may be asked to: Return on a different day to have your blood pressure checked again. Monitor your blood pressure at home for 1 week or longer. If you are diagnosed with hypertension, you may have other blood or imaging tests to help your health care provider understand your overall risk for other conditions. How is this treated? This condition is treated by making healthy lifestyle changes, such as eating healthy foods, exercising more, and reducing your alcohol intake. You may be referred for counseling on a healthy diet and physical activity. Your health care provider may prescribe medicine if lifestyle changes are not enough to get your blood pressure under control and if: Your systolic blood pressure is above 130. Your diastolic blood pressure is above 80. Your personal target blood pressure may vary depending on your medical conditions, your age, and other factors. Follow these instructions at home: Eating and drinking  Eat a diet that is high in fiber and potassium, and low in sodium, added sugar, and fat. An example of this eating plan is called the DASH diet. DASH stands for Dietary Approaches to Stop Hypertension. To eat this way: Eat  plenty of fresh fruits and vegetables. Try to fill one half of your plate at each meal with fruits and vegetables. Eat whole grains, such as whole-wheat pasta, brown rice, or whole-grain bread. Fill about one  fourth of your plate with whole grains. Eat or drink low-fat dairy products, such as skim milk or low-fat yogurt. Avoid fatty cuts of meat, processed or cured meats, and poultry with skin. Fill about one fourth of your plate with lean proteins, such as fish, chicken without skin, beans, eggs, or tofu. Avoid pre-made and processed foods. These tend to be higher in sodium, added sugar, and fat. Reduce your daily sodium intake. Many people with hypertension should eat less than 1,500 mg of sodium a day. Do not drink alcohol if: Your health care provider tells you not to drink. You are pregnant, may be pregnant, or are planning to become pregnant. If you drink alcohol: Limit how much you have to: 0-1 drink a day for women. 0-2 drinks a day for men. Know how much alcohol is in your drink. In the [place] one drink equals one 12 oz bottle of beer (355 mL), one 5 oz glass of wine (148 mL), or one 1 oz glass of hard liquor (44 mL). Lifestyle  Work with your health care provider to maintain a healthy body weight or to lose weight. Ask what an ideal weight is for you. Get at least 30 minutes of exercise that causes your heart to beat faster (aerobic exercise) most days of the week. Activities may include walking, swimming, or biking. Include exercise to strengthen your muscles (resistance exercise), such as Pilates or lifting weights, as part of your weekly exercise routine. Try to do these types of exercises for 30 minutes at least 3 days a week. Do not use any products that contain nicotine or tobacco. These products include cigarettes, chewing tobacco, and vaping devices, such as e-cigarettes. If you need help quitting, ask your health care provider. Monitor your blood pressure at home as told by your health care provider. Keep all follow-up visits. This is important. Medicines Take over-the-counter and prescription medicines only as told by your health care provider. Follow directions carefully. Blood  pressure medicines must be taken as prescribed. Do not skip doses of blood pressure medicine. Doing this puts you at risk for problems and can make the medicine less effective. Ask your health care provider about side effects or reactions to medicines that you should watch for. Contact a health care provider if you: Think you are having a reaction to a medicine you are taking. Have headaches that keep coming back (recurring). Feel dizzy. Have swelling in your ankles. Have trouble with your vision. Get help right away if you: Develop a severe headache or confusion. Have unusual weakness or numbness. Feel faint. Have severe pain in your chest or abdomen. Vomit repeatedly. Have trouble breathing. These symptoms may be an emergency. Get help right away. Call 911. Do not wait to see if the symptoms will go away. Do not drive yourself to the hospital. Summary Hypertension is when the force of blood pumping through your arteries is too strong. If this condition is not controlled, it may put you at risk for serious complications. Your personal target blood pressure may vary depending on your medical conditions, your age, and other factors. For most people, a normal blood pressure is less than 120/80. Hypertension is treated with lifestyle changes, medicines, or a combination of both. Lifestyle changes include losing weight, eating a healthy,  low-sodium diet, exercising more, and limiting alcohol. This information is not intended to replace advice given to you by your health care provider. Make sure you discuss any questions you have with your health care provider. Document Revised: 06/02/2021 Document Reviewed: 06/02/2021 Elsevier Patient Education  2024 [employer].

## 2024-05-21 NOTE — Progress Notes (Unsigned)
 Subjective:  Patient ID: Emily [name], female    DOB: 19-May-1966  Age: 58 y.o. MRN: [id number]  CC: Hypertension   HPI Emily [name] presents for f/up ----   Discussed the use of AI scribe software for clinical note transcription with the patient, who gave verbal consent to proceed.  History of Present Illness Emily [name] is a 58 year old female who presents with hair loss and concerns about blood pressure.  She is experiencing significant hair loss, describing it as 'falling out in droves' and estimates that over half of her hair has fallen out. She is concerned that her medication, possibly spironolactone , might be contributing to this, although she is unsure. She mentions recent stress due to her father's stomach cancer diagnosis, which may be affecting her.  Her blood pressure has been good, although it tends to rise during visits. No headaches, chest pain, or shortness of breath related to her blood pressure.  She has noticed crusting around her eyes, which has worsened over the past six months. She describes it as chronic, with occasional itching and weeping. She does not use any eye drops or over-the-counter allergy medications.  She reports a history of constipation, which she describes as normal for her, and does not treat it. Her last bowel movement was this morning without pain.  She has not had a COVID vaccine in two years. She does not drink alcohol but smokes, although she has cut down significantly.     Outpatient Medications Prior to Visit  Medication Sig Dispense Refill   carvedilol  (COREG ) 25 MG tablet Take 1 tablet (25 mg total) by mouth 2 (two) times daily with a meal. 180 tablet 0   cloNIDine  (CATAPRES  - DOSED IN MG/24 HR) 0.2 mg/24hr patch APPLY 1 PATCH TOPICALLY TO THE SKIN 1 TIME A WEEK 12 patch 0   Ferric Maltol  (ACCRUFER ) 30 MG CAPS Take 1 capsule (30 mg total) by mouth in the morning and at bedtime. 180 capsule 0   methadone  (DOLOPHINE ) 5  MG tablet Take 1 tablet (5 mg total) by mouth every 8 (eight) hours. 90 tablet 0   olmesartan  (BENICAR ) 40 MG tablet Take 1 tablet (40 mg total) by mouth daily. 90 tablet 0   Omega-3 Fatty Acids (FISH OIL) 1000 MG CAPS Take 2 capsules by mouth every evening.     spironolactone  (ALDACTONE ) 25 MG tablet Take 1 tablet (25 mg total) by mouth daily. 90 tablet 0   atorvastatin  (LIPITOR) 10 MG tablet Take 1 tablet (10 mg total) by mouth daily. 90 tablet 1   Cholecalciferol  50 MCG (2000 UT) TABS Take 1 tablet (2,000 Units total) by mouth daily. (Patient not taking: Reported on 05/21/2024) 90 tablet 1   Fluticasone-Umeclidin-Vilant (TRELEGY ELLIPTA ) 100-62.5-25 MCG/ACT AEPB Inhale 1 puff into the lungs daily. (Patient not taking: Reported on 05/21/2024) 120 each 1   methadone  (DOLOPHINE ) 10 MG tablet Take 1 tablet (10 mg total) by mouth every 8 (eight) hours. 90 tablet 0   pantoprazole (PROTONIX) 40 MG tablet Take 40 mg by mouth daily. (Patient not taking: Reported on 05/21/2024)     thiamine  (VITAMIN B1) 100 MG tablet Take 1 tablet (100 mg total) by mouth daily. (Patient not taking: Reported on 05/21/2024) 90 tablet 0   cyanocobalamin  2000 MCG tablet Take 1 tablet (2,000 mcg total) by mouth daily. (Patient not taking: Reported on 05/21/2024) 90 tablet 1   folic acid  (FOLVITE ) 1 MG tablet Take 1 tablet (1  mg total) by mouth daily. (Patient not taking: Reported on 05/21/2024) 90 tablet 0   No facility-administered medications prior to visit.    ROS Review of Systems  Eyes:  Positive for redness and itching. Negative for photophobia, pain, discharge and visual disturbance.    Objective:  BP 138/82 (BP Location: Right Arm, Patient Position: Sitting, Cuff Size: Normal)   Pulse 69   Temp 98.5 F (36.9 C) (Oral)   Resp 18   Wt 124 lb 6.4 oz (56.4 kg)   SpO2 99%   BMI 24.30 kg/m   BP Readings from Last 3 Encounters:  05/21/24 138/82  01/09/24 (!) 160/96  09/15/23 (!) 162/118    Wt Readings from  Last 3 Encounters:  05/21/24 124 lb 6.4 oz (56.4 kg)  01/09/24 123 lb (55.8 kg)  09/15/23 133 lb 6.4 oz (60.5 kg)    Physical Exam Vitals reviewed.  Constitutional:      Appearance: Normal appearance.  HENT:     Nose: Nose normal.     Mouth/Throat:     Mouth: Mucous membranes are moist.  Eyes:     General:        Right eye: No discharge.        Left eye: No discharge.     Conjunctiva/sclera:     Right eye: Right conjunctiva is injected. No chemosis, exudate or hemorrhage.    Left eye: Left conjunctiva is injected. No chemosis, exudate or hemorrhage. Cardiovascular:     Rate and Rhythm: Normal rate and regular rhythm.     Heart sounds: No murmur heard.    No friction rub. No gallop.  Pulmonary:     Effort: Pulmonary effort is normal.     Breath sounds: No stridor. No wheezing, rhonchi or rales.  Abdominal:     General: Abdomen is flat.     Palpations: There is no mass.     Tenderness: There is no abdominal tenderness. There is no guarding.     Hernia: No hernia is present.  Musculoskeletal:        General: Normal range of motion.     Cervical back: Neck supple.     Right lower leg: No edema.     Left lower leg: No edema.  Lymphadenopathy:     Cervical: No cervical adenopathy.  Skin:    General: Skin is warm and dry.  Neurological:     General: No focal deficit present.     Mental Status: She is alert.  Psychiatric:        Mood and Affect: Mood normal.        Behavior: Behavior normal.     Lab Results  Component Value Date   WBC 14.7 (H) 05/21/2024   HGB 14.3 05/21/2024   HCT 43.7 05/21/2024   PLT 226.0 05/21/2024   GLUCOSE 108 (H) 05/21/2024   CHOL 156 01/09/2024   TRIG 145.0 01/09/2024   HDL 34.40 (L) 01/09/2024   LDLDIRECT 92.0 01/14/2022   LDLCALC 92 01/09/2024   ALT 6 09/15/2023   AST 18 09/15/2023   NA 136 05/21/2024   K 4.1 05/21/2024   CL 98 05/21/2024   CREATININE 1.19 05/21/2024   BUN 16 05/21/2024   CO2 29 05/21/2024   TSH 2.75  05/21/2024   INR 1.1 (H) 10/09/2020   HGBA1C 6.2 05/21/2024    MM 3D SCREEN BREAST BILATERAL Result Date: 01/18/2022 CLINICAL DATA:  Screening. EXAM: DIGITAL SCREENING BILATERAL MAMMOGRAM WITH TOMOSYNTHESIS AND CAD TECHNIQUE: Bilateral screening digital craniocaudal  and mediolateral oblique mammograms were obtained. Bilateral screening digital breast tomosynthesis was performed. The images were evaluated with computer-aided detection. COMPARISON:  None available. ACR Breast Density Category b: There are scattered areas of fibroglandular density. FINDINGS: There are no findings suspicious for malignancy. IMPRESSION: No mammographic evidence of malignancy. A result letter of this screening mammogram will be mailed directly to the patient. RECOMMENDATION: Screening mammogram in one year. (Code:SM-B-01Y) BI-RADS CATEGORY  1: Negative. Electronically Signed   By: Craig Farr M.D.   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  Dispense: 90 tablet; Refill: 0 -     Folic Acid ; Take 1 tablet (1 mg total) by mouth daily.  Dispense: 90 tablet; Refill: 0 -     CBC with Differential/Platelet; Future -     Cyanocobalamin   Dietary folate deficiency anemia -     Folic Acid ; Take 1 tablet (1 mg total) by mouth daily.  Dispense: 90 tablet; Refill: 0 -     CBC with Differential/Platelet; Future  Hyperlipidemia with target LDL less than 70 -     Atorvastatin  Calcium ; Take 1 tablet (10 mg total) by mouth daily.  Dispense: 90 tablet; Refill: 0 -     TSH; Future  Chronic hyperglycemia -     Basic metabolic panel with GFR; Future -     Hemoglobin A1c; Future  Essential hypertension -     Basic metabolic panel with GFR; Future -     CBC with Differential/Platelet; Future -     TSH;  Future -     Urinalysis, Routine w reflex microscopic; Future  Need for prophylactic vaccination and inoculation against varicella -     Shingrix ; Inject 0.5 mLs into the muscle once for 1 dose.  Dispense: 0.5 mL; Refill: 1  Iron deficiency anemia secondary to inadequate dietary iron intake  Allergic conjunctivitis of both eyes -     Olopatadine HCl; Place 1 drop into both eyes 2 (two) times daily.  Dispense: 5 mL; Refill: 3  Stage 3b chronic kidney disease (HCC)  Other orders -     COVID-19 mRNA Vac-TriS(Pfizer); Inject 0.3 mLs into the muscle once for 1 dose.  Dispense: 0.3 mL; Refill: 0     Follow-up: Return in about 3 months (around 08/21/2024).  Debby Molt, MD

## 2024-05-22 MED ORDER — CARVEDILOL 25 MG PO TABS: 25.0000 mg | ORAL_TABLET | Freq: Two times a day (BID) | ORAL | 0 refills | Status: AC

## 2024-05-22 MED ORDER — CLONIDINE 0.2 MG/24HR TD PTWK: MEDICATED_PATCH | TRANSDERMAL | 0 refills | Status: DC

## 2024-05-22 MED ORDER — OLMESARTAN MEDOXOMIL 40 MG PO TABS: 40.0000 mg | ORAL_TABLET | Freq: Every day | ORAL | 0 refills | Status: AC

## 2024-05-22 MED ORDER — SPIRONOLACTONE 25 MG PO TABS: 25.0000 mg | ORAL_TABLET | Freq: Every day | ORAL | 0 refills | Status: AC

## 2024-05-28 ENCOUNTER — Other Ambulatory Visit: Payer: Self-pay

## 2024-05-28 ENCOUNTER — Encounter: Payer: Self-pay | Admitting: Internal Medicine

## 2024-05-28 DIAGNOSIS — F1120 Opioid dependence, uncomplicated: Secondary | ICD-10-CM

## 2024-05-28 MED ORDER — METHADONE HCL 10 MG PO TABS: 10.0000 mg | ORAL_TABLET | Freq: Three times a day (TID) | ORAL | 0 refills | Status: DC

## 2024-05-28 MED ORDER — METHADONE HCL 5 MG PO TABS
5.0000 mg | ORAL_TABLET | Freq: Three times a day (TID) | ORAL | 0 refills | Status: DC
Start: 2024-05-28 — End: 2024-06-26

## 2024-06-25 ENCOUNTER — Encounter: Payer: Self-pay | Admitting: Internal Medicine

## 2024-06-26 ENCOUNTER — Encounter: Payer: Self-pay | Admitting: Internal Medicine

## 2024-06-26 ENCOUNTER — Other Ambulatory Visit: Payer: Self-pay | Admitting: Internal Medicine

## 2024-06-26 DIAGNOSIS — F1120 Opioid dependence, uncomplicated: Secondary | ICD-10-CM

## 2024-06-26 NOTE — Telephone Encounter (Signed)
 Copied from CRM [id number]. Topic: Clinical - Medication Refill >> Jun 26, 2024 12:05 PM Aleatha C wrote: Medication: Methadone  10 mg and Methadone  5 mg   Has the patient contacted their pharmacy? Yes (Agent: If no, request that the patient contact the pharmacy for the refill. If patient does not wish to contact the pharmacy document the reason why and proceed with request.) (Agent: If yes, when and what did the pharmacy advise?)  This is the patient's preferred pharmacy:  [id number] [place], [place] - [address] AT [facility] [address] [facility] [address] [place] [address] Phone: [phone] Fax: [phone]    Is this the correct pharmacy for this prescription? Yes If no, delete pharmacy and type the correct one.   Has the prescription been filled recently? No  Is the patient out of the medication? yes  Has the patient been seen for an appointment in the last year OR does the patient have an upcoming appointment? Yes  Can we respond through MyChart? No  Agent: Please be advised that Rx refills may take up to 3 business days. We ask that you follow-up with your pharmacy.

## 2024-06-27 ENCOUNTER — Telehealth: Payer: Self-pay

## 2024-06-27 MED ORDER — METHADONE HCL 10 MG PO TABS
10.0000 mg | ORAL_TABLET | Freq: Three times a day (TID) | ORAL | 0 refills | Status: AC
Start: 2024-06-27 — End: ?

## 2024-06-27 MED ORDER — METHADONE HCL 5 MG PO TABS
5.0000 mg | ORAL_TABLET | Freq: Three times a day (TID) | ORAL | 0 refills | Status: AC
Start: 2024-06-27 — End: ?

## 2024-06-27 NOTE — Telephone Encounter (Signed)
 Copied from CRM #[id number]. Topic: Clinical - Prescription Issue >> Jun 27, 2024  8:36 AM Rea ORN wrote: Reason for CRM: Pt called to check status of rx request. Pt originally sent request 11/17. I advised pt of the 3 business day policy for refill requests. I explained that as of now the PCP has until today to send the rx and it is not considered late. I also advised the pt to call when she has 5 days remaining so that she does not run out. It stated she took her last tablets today and is concerned about not having it for tomorrow.  Pt is asking CMA to call back [phone] to advise once refill has been sent.

## 2024-06-29 NOTE — Telephone Encounter (Signed)
 Medication has been refilled.

## 2024-07-23 ENCOUNTER — Encounter: Payer: Self-pay | Admitting: Internal Medicine

## 2024-07-23 ENCOUNTER — Other Ambulatory Visit (HOSPITAL_COMMUNITY): Payer: Self-pay

## 2024-07-23 ENCOUNTER — Telehealth: Payer: Self-pay

## 2024-07-23 ENCOUNTER — Other Ambulatory Visit: Payer: Self-pay | Admitting: Internal Medicine

## 2024-07-23 DIAGNOSIS — F1120 Opioid dependence, uncomplicated: Secondary | ICD-10-CM

## 2024-07-23 MED ORDER — METHADONE HCL 10 MG PO TABS: 10.0000 mg | ORAL_TABLET | Freq: Three times a day (TID) | ORAL | 0 refills | Status: DC

## 2024-07-23 MED ORDER — METHADONE HCL 5 MG PO TABS: 5.0000 mg | ORAL_TABLET | Freq: Three times a day (TID) | ORAL | 0 refills | Status: DC

## 2024-07-23 NOTE — Telephone Encounter (Signed)
 Pharmacy Patient Advocate Encounter   Received notification from Onbase that prior authorization for Methadone  HCl 10MG  tablets  is required/requested.   Insurance verification completed.   The patient is insured through [facility] MEDICAID.   Per test claim: PA required; PA submitted to above mentioned insurance via Latent Key/confirmation #/EOC [id number] Status is pending

## 2024-07-24 ENCOUNTER — Other Ambulatory Visit (HOSPITAL_COMMUNITY): Payer: Self-pay

## 2024-07-24 NOTE — Telephone Encounter (Signed)
 Pharmacy Patient Advocate Encounter  Received notification from [facility] MEDICAID that Prior Authorization for Methadone  HCl 10MG  tablets  has been APPROVED from 07/24/2024 to 10/22/8204   PA #/Case ID/Reference #: [id number]  PLEASE BE ADVISED OUTCOME FROM PLAN  Approved. This drug has been approved. Approved quantity: 90 tablets per 30 day(s). You may fill up to a 34 day supply at a retail pharmacy. You may fill up to a 90 day supply for maintenance drugs, please refer to the formulary for details. Please call the pharmacy to process your prescription claim. Authorization Expiration03/16/2026

## 2024-07-25 NOTE — Telephone Encounter (Signed)
 Patient has been made aware and gave a verbal understanding.

## 2024-08-20 ENCOUNTER — Other Ambulatory Visit: Payer: Self-pay

## 2024-08-20 ENCOUNTER — Encounter: Payer: Self-pay | Admitting: Internal Medicine

## 2024-08-20 DIAGNOSIS — F1120 Opioid dependence, uncomplicated: Secondary | ICD-10-CM

## 2024-08-20 DIAGNOSIS — I10 Essential (primary) hypertension: Secondary | ICD-10-CM

## 2024-08-20 MED ORDER — METHADONE HCL 10 MG PO TABS: 10.0000 mg | ORAL_TABLET | Freq: Three times a day (TID) | ORAL | 0 refills | Status: AC

## 2024-08-20 MED ORDER — CLONIDINE 0.2 MG/24HR TD PTWK: MEDICATED_PATCH | TRANSDERMAL | 0 refills | Status: AC

## 2024-08-20 MED ORDER — METHADONE HCL 5 MG PO TABS: 5.0000 mg | ORAL_TABLET | Freq: Three times a day (TID) | ORAL | 0 refills | Status: AC

## 2024-09-06 ENCOUNTER — Ambulatory Visit: Admitting: Internal Medicine

## 2024-09-19 ENCOUNTER — Ambulatory Visit: Admitting: Internal Medicine

## 2024-10-03 ENCOUNTER — Ambulatory Visit: Admitting: Internal Medicine
